# Patient Record
Sex: Female | Born: 1955 | Race: White | Hispanic: No | Marital: Married | Smoking: Never smoker
Health system: Southern US, Community
[De-identification: ages and names within clinical notes are randomized; demographics above are authoritative.]

## PROBLEM LIST (undated history)

## (undated) DIAGNOSIS — I639 Cerebral infarction, unspecified: Secondary | ICD-10-CM

## (undated) DIAGNOSIS — I10 Essential (primary) hypertension: Secondary | ICD-10-CM

## (undated) DIAGNOSIS — F32A Depression, unspecified: Secondary | ICD-10-CM

## (undated) DIAGNOSIS — K219 Gastro-esophageal reflux disease without esophagitis: Secondary | ICD-10-CM

## (undated) DIAGNOSIS — Z973 Presence of spectacles and contact lenses: Secondary | ICD-10-CM

## (undated) DIAGNOSIS — T8859XA Other complications of anesthesia, initial encounter: Secondary | ICD-10-CM

## (undated) DIAGNOSIS — F329 Major depressive disorder, single episode, unspecified: Secondary | ICD-10-CM

## (undated) DIAGNOSIS — D649 Anemia, unspecified: Secondary | ICD-10-CM

## (undated) DIAGNOSIS — M545 Low back pain, unspecified: Secondary | ICD-10-CM

## (undated) DIAGNOSIS — M797 Fibromyalgia: Secondary | ICD-10-CM

## (undated) DIAGNOSIS — I2699 Other pulmonary embolism without acute cor pulmonale: Secondary | ICD-10-CM

## (undated) DIAGNOSIS — Z9289 Personal history of other medical treatment: Secondary | ICD-10-CM

## (undated) DIAGNOSIS — F419 Anxiety disorder, unspecified: Secondary | ICD-10-CM

## (undated) DIAGNOSIS — G43909 Migraine, unspecified, not intractable, without status migrainosus: Secondary | ICD-10-CM

## (undated) DIAGNOSIS — J302 Other seasonal allergic rhinitis: Secondary | ICD-10-CM

## (undated) DIAGNOSIS — D497 Neoplasm of unspecified behavior of endocrine glands and other parts of nervous system: Secondary | ICD-10-CM

## (undated) DIAGNOSIS — R197 Diarrhea, unspecified: Secondary | ICD-10-CM

## (undated) DIAGNOSIS — M5136 Other intervertebral disc degeneration, lumbar region: Secondary | ICD-10-CM

## (undated) DIAGNOSIS — G8929 Other chronic pain: Secondary | ICD-10-CM

## (undated) DIAGNOSIS — M109 Gout, unspecified: Secondary | ICD-10-CM

## (undated) DIAGNOSIS — D469 Myelodysplastic syndrome, unspecified: Secondary | ICD-10-CM

## (undated) DIAGNOSIS — M51369 Other intervertebral disc degeneration, lumbar region without mention of lumbar back pain or lower extremity pain: Secondary | ICD-10-CM

## (undated) DIAGNOSIS — M1990 Unspecified osteoarthritis, unspecified site: Secondary | ICD-10-CM

## (undated) DIAGNOSIS — E7800 Pure hypercholesterolemia, unspecified: Secondary | ICD-10-CM

## (undated) DIAGNOSIS — K635 Polyp of colon: Secondary | ICD-10-CM

## (undated) DIAGNOSIS — Z87442 Personal history of urinary calculi: Secondary | ICD-10-CM

## (undated) DIAGNOSIS — E559 Vitamin D deficiency, unspecified: Secondary | ICD-10-CM

## (undated) DIAGNOSIS — G510 Bell's palsy: Secondary | ICD-10-CM

## (undated) HISTORY — DX: Morbid (severe) obesity due to excess calories: E66.01

## (undated) HISTORY — DX: Vitamin D deficiency, unspecified: E55.9

## (undated) HISTORY — DX: Other intervertebral disc degeneration, lumbar region: M51.36

## (undated) HISTORY — DX: Gout, unspecified: M10.9

## (undated) HISTORY — DX: Bell's palsy: G51.0

## (undated) HISTORY — PX: JOINT REPLACEMENT: SHX530

## (undated) HISTORY — PX: LAMINECTOMY AND MICRODISCECTOMY SPINE: SHX1914

## (undated) HISTORY — DX: Fibromyalgia: M79.7

## (undated) HISTORY — DX: Other intervertebral disc degeneration, lumbar region without mention of lumbar back pain or lower extremity pain: M51.369

## (undated) HISTORY — DX: Migraine, unspecified, not intractable, without status migrainosus: G43.909

## (undated) HISTORY — PX: BACK SURGERY: SHX140

## (undated) HISTORY — PX: BLADDER SURGERY: SHX569

## (undated) HISTORY — PX: ENDOMETRIAL ABLATION: SHX621

## (undated) HISTORY — DX: Other pulmonary embolism without acute cor pulmonale: I26.99

## (undated) HISTORY — PX: COLONOSCOPY W/ POLYPECTOMY: SHX1380

## (undated) HISTORY — DX: Polyp of colon: K63.5

## (undated) HISTORY — PX: DIAGNOSTIC LAPAROSCOPY: SUR761

## (undated) HISTORY — PX: COLONOSCOPY: SHX174

---

## 1988-01-04 HISTORY — PX: PAROTID GLAND TUMOR EXCISION: SHX5221

## 1997-01-03 HISTORY — PX: BREAST BIOPSY: SHX20

## 1998-03-13 ENCOUNTER — Other Ambulatory Visit: Admission: RE | Admit: 1998-03-13 | Discharge: 1998-03-13 | Payer: Self-pay | Admitting: Gynecology

## 1999-01-22 ENCOUNTER — Ambulatory Visit (HOSPITAL_COMMUNITY): Admission: RE | Admit: 1999-01-22 | Discharge: 1999-01-22 | Payer: Self-pay | Admitting: Urology

## 1999-01-22 ENCOUNTER — Encounter: Payer: Self-pay | Admitting: Urology

## 1999-01-27 ENCOUNTER — Encounter: Payer: Self-pay | Admitting: Urology

## 1999-01-27 ENCOUNTER — Encounter: Admission: RE | Admit: 1999-01-27 | Discharge: 1999-01-27 | Payer: Self-pay | Admitting: Urology

## 1999-02-16 ENCOUNTER — Encounter: Payer: Self-pay | Admitting: Urology

## 1999-02-16 ENCOUNTER — Encounter: Admission: RE | Admit: 1999-02-16 | Discharge: 1999-02-16 | Payer: Self-pay | Admitting: Urology

## 1999-05-11 ENCOUNTER — Other Ambulatory Visit: Admission: RE | Admit: 1999-05-11 | Discharge: 1999-05-11 | Payer: Self-pay | Admitting: Gynecology

## 1999-05-20 ENCOUNTER — Other Ambulatory Visit: Admission: RE | Admit: 1999-05-20 | Discharge: 1999-05-20 | Payer: Self-pay | Admitting: Gynecology

## 1999-05-20 ENCOUNTER — Encounter (INDEPENDENT_AMBULATORY_CARE_PROVIDER_SITE_OTHER): Payer: Self-pay

## 1999-09-28 ENCOUNTER — Encounter: Payer: Self-pay | Admitting: Urology

## 1999-09-28 ENCOUNTER — Encounter: Admission: RE | Admit: 1999-09-28 | Discharge: 1999-09-28 | Payer: Self-pay | Admitting: Urology

## 1999-10-05 ENCOUNTER — Encounter: Admission: RE | Admit: 1999-10-05 | Discharge: 1999-10-05 | Payer: Self-pay | Admitting: Urology

## 1999-10-05 ENCOUNTER — Encounter: Payer: Self-pay | Admitting: Urology

## 1999-10-05 ENCOUNTER — Encounter (INDEPENDENT_AMBULATORY_CARE_PROVIDER_SITE_OTHER): Payer: Self-pay | Admitting: *Deleted

## 2000-07-12 ENCOUNTER — Other Ambulatory Visit: Admission: RE | Admit: 2000-07-12 | Discharge: 2000-07-12 | Payer: Self-pay | Admitting: Gynecology

## 2001-07-12 ENCOUNTER — Other Ambulatory Visit: Admission: RE | Admit: 2001-07-12 | Discharge: 2001-07-12 | Payer: Self-pay | Admitting: Gynecology

## 2002-07-23 ENCOUNTER — Other Ambulatory Visit: Admission: RE | Admit: 2002-07-23 | Discharge: 2002-07-23 | Payer: Self-pay | Admitting: Gynecology

## 2003-08-05 ENCOUNTER — Other Ambulatory Visit: Admission: RE | Admit: 2003-08-05 | Discharge: 2003-08-05 | Payer: Self-pay | Admitting: Gynecology

## 2004-06-03 HISTORY — PX: TUBAL LIGATION: SHX77

## 2004-07-02 ENCOUNTER — Ambulatory Visit (HOSPITAL_COMMUNITY): Admission: RE | Admit: 2004-07-02 | Discharge: 2004-07-02 | Payer: Self-pay | Admitting: Gynecology

## 2004-07-02 ENCOUNTER — Ambulatory Visit (HOSPITAL_BASED_OUTPATIENT_CLINIC_OR_DEPARTMENT_OTHER): Admission: RE | Admit: 2004-07-02 | Discharge: 2004-07-02 | Payer: Self-pay | Admitting: Gynecology

## 2004-07-02 ENCOUNTER — Encounter (INDEPENDENT_AMBULATORY_CARE_PROVIDER_SITE_OTHER): Payer: Self-pay | Admitting: Specialist

## 2004-11-23 ENCOUNTER — Other Ambulatory Visit: Admission: RE | Admit: 2004-11-23 | Discharge: 2004-11-23 | Payer: Self-pay | Admitting: Gynecology

## 2005-09-08 ENCOUNTER — Other Ambulatory Visit: Admission: RE | Admit: 2005-09-08 | Discharge: 2005-09-08 | Payer: Self-pay | Admitting: Gynecology

## 2005-10-19 ENCOUNTER — Emergency Department (HOSPITAL_COMMUNITY): Admission: EM | Admit: 2005-10-19 | Discharge: 2005-10-19 | Payer: Self-pay | Admitting: Emergency Medicine

## 2007-06-01 ENCOUNTER — Ambulatory Visit: Payer: Self-pay | Admitting: Internal Medicine

## 2007-06-15 ENCOUNTER — Ambulatory Visit: Payer: Self-pay | Admitting: Internal Medicine

## 2007-06-15 ENCOUNTER — Encounter: Payer: Self-pay | Admitting: Internal Medicine

## 2007-06-18 ENCOUNTER — Encounter: Payer: Self-pay | Admitting: Internal Medicine

## 2008-01-04 DIAGNOSIS — D497 Neoplasm of unspecified behavior of endocrine glands and other parts of nervous system: Secondary | ICD-10-CM

## 2008-01-04 HISTORY — PX: BIOPSY THYROID: PRO38

## 2008-01-04 HISTORY — DX: Neoplasm of unspecified behavior of endocrine glands and other parts of nervous system: D49.7

## 2008-01-18 ENCOUNTER — Encounter: Admission: RE | Admit: 2008-01-18 | Discharge: 2008-01-18 | Payer: Self-pay | Admitting: Rheumatology

## 2008-01-23 ENCOUNTER — Ambulatory Visit: Payer: Self-pay | Admitting: Cardiology

## 2008-01-23 ENCOUNTER — Encounter (INDEPENDENT_AMBULATORY_CARE_PROVIDER_SITE_OTHER): Payer: Self-pay | Admitting: *Deleted

## 2008-01-23 ENCOUNTER — Ambulatory Visit (HOSPITAL_COMMUNITY): Admission: RE | Admit: 2008-01-23 | Discharge: 2008-01-23 | Payer: Self-pay | Admitting: Rheumatology

## 2008-01-23 ENCOUNTER — Inpatient Hospital Stay (HOSPITAL_COMMUNITY): Admission: EM | Admit: 2008-01-23 | Discharge: 2008-01-27 | Payer: Self-pay | Admitting: Emergency Medicine

## 2008-01-23 ENCOUNTER — Ambulatory Visit (HOSPITAL_COMMUNITY): Admission: RE | Admit: 2008-01-23 | Discharge: 2008-01-23 | Payer: Self-pay | Admitting: Interventional Radiology

## 2008-01-25 ENCOUNTER — Encounter (INDEPENDENT_AMBULATORY_CARE_PROVIDER_SITE_OTHER): Payer: Self-pay | Admitting: Internal Medicine

## 2008-02-08 ENCOUNTER — Ambulatory Visit (HOSPITAL_COMMUNITY): Admission: RE | Admit: 2008-02-08 | Discharge: 2008-02-08 | Payer: Self-pay | Admitting: Rheumatology

## 2008-02-22 ENCOUNTER — Inpatient Hospital Stay (HOSPITAL_COMMUNITY): Admission: EM | Admit: 2008-02-22 | Discharge: 2008-02-23 | Payer: Self-pay | Admitting: Emergency Medicine

## 2008-02-28 ENCOUNTER — Ambulatory Visit: Payer: Self-pay | Admitting: Cardiology

## 2008-03-05 ENCOUNTER — Inpatient Hospital Stay (HOSPITAL_COMMUNITY): Admission: EM | Admit: 2008-03-05 | Discharge: 2008-03-06 | Payer: Self-pay | Admitting: Emergency Medicine

## 2008-03-06 ENCOUNTER — Ambulatory Visit: Payer: Self-pay

## 2008-08-28 ENCOUNTER — Ambulatory Visit: Payer: Self-pay | Admitting: Gynecology

## 2008-08-28 ENCOUNTER — Encounter: Payer: Self-pay | Admitting: Gynecology

## 2008-08-28 ENCOUNTER — Other Ambulatory Visit: Admission: RE | Admit: 2008-08-28 | Discharge: 2008-08-28 | Payer: Self-pay | Admitting: Gynecology

## 2008-09-02 ENCOUNTER — Ambulatory Visit: Payer: Self-pay | Admitting: Gynecology

## 2008-09-03 ENCOUNTER — Ambulatory Visit: Payer: Self-pay | Admitting: Gynecology

## 2008-09-09 ENCOUNTER — Ambulatory Visit: Payer: Self-pay | Admitting: Gynecology

## 2008-10-27 ENCOUNTER — Telehealth: Payer: Self-pay | Admitting: Internal Medicine

## 2008-10-29 ENCOUNTER — Ambulatory Visit: Payer: Self-pay | Admitting: Internal Medicine

## 2008-10-29 DIAGNOSIS — R079 Chest pain, unspecified: Secondary | ICD-10-CM | POA: Insufficient documentation

## 2008-10-29 DIAGNOSIS — Z860100 Personal history of colon polyps, unspecified: Secondary | ICD-10-CM | POA: Insufficient documentation

## 2008-10-29 DIAGNOSIS — K219 Gastro-esophageal reflux disease without esophagitis: Secondary | ICD-10-CM | POA: Insufficient documentation

## 2008-10-31 ENCOUNTER — Encounter: Payer: Self-pay | Admitting: Cardiology

## 2008-11-12 ENCOUNTER — Ambulatory Visit (HOSPITAL_COMMUNITY): Admission: RE | Admit: 2008-11-12 | Discharge: 2008-11-12 | Payer: Self-pay | Admitting: Neurosurgery

## 2008-11-18 ENCOUNTER — Encounter: Payer: Self-pay | Admitting: Cardiology

## 2008-11-18 ENCOUNTER — Telehealth (INDEPENDENT_AMBULATORY_CARE_PROVIDER_SITE_OTHER): Payer: Self-pay | Admitting: *Deleted

## 2008-11-19 ENCOUNTER — Ambulatory Visit: Payer: Self-pay | Admitting: Cardiovascular Disease

## 2008-11-19 ENCOUNTER — Encounter: Payer: Self-pay | Admitting: Cardiology

## 2008-11-28 ENCOUNTER — Encounter: Payer: Self-pay | Admitting: Cardiovascular Disease

## 2008-11-28 ENCOUNTER — Ambulatory Visit: Payer: Self-pay

## 2008-11-28 ENCOUNTER — Ambulatory Visit: Payer: Self-pay | Admitting: Internal Medicine

## 2008-11-28 ENCOUNTER — Ambulatory Visit (HOSPITAL_COMMUNITY): Admission: RE | Admit: 2008-11-28 | Discharge: 2008-11-28 | Payer: Self-pay | Admitting: Cardiovascular Disease

## 2008-12-11 ENCOUNTER — Ambulatory Visit (HOSPITAL_COMMUNITY): Admission: RE | Admit: 2008-12-11 | Discharge: 2008-12-12 | Payer: Self-pay | Admitting: Neurosurgery

## 2009-01-05 ENCOUNTER — Encounter: Payer: Self-pay | Admitting: Internal Medicine

## 2009-01-17 ENCOUNTER — Ambulatory Visit (HOSPITAL_COMMUNITY): Admission: RE | Admit: 2009-01-17 | Discharge: 2009-01-17 | Payer: Self-pay | Admitting: Neurosurgery

## 2009-01-29 ENCOUNTER — Ambulatory Visit (HOSPITAL_COMMUNITY): Admission: RE | Admit: 2009-01-29 | Discharge: 2009-01-30 | Payer: Self-pay | Admitting: Neurosurgery

## 2009-03-18 ENCOUNTER — Ambulatory Visit (HOSPITAL_COMMUNITY): Admission: RE | Admit: 2009-03-18 | Discharge: 2009-03-18 | Payer: Self-pay

## 2009-03-18 ENCOUNTER — Ambulatory Visit: Payer: Self-pay | Admitting: Gynecology

## 2009-10-30 ENCOUNTER — Ambulatory Visit (HOSPITAL_COMMUNITY)
Admission: RE | Admit: 2009-10-30 | Discharge: 2009-10-30 | Payer: Self-pay | Source: Home / Self Care | Admitting: Internal Medicine

## 2009-11-05 ENCOUNTER — Ambulatory Visit: Payer: Self-pay | Admitting: Vascular Surgery

## 2009-11-05 ENCOUNTER — Ambulatory Visit
Admission: RE | Admit: 2009-11-05 | Discharge: 2009-11-05 | Payer: Self-pay | Source: Home / Self Care | Admitting: Internal Medicine

## 2010-01-19 IMAGING — CT CT ABDOMEN W/ CM
2 of 7 series · 13 of 46 positions shown, 18 images · IV contrast (APPLIED)
Comparison: None

CT CHEST

CLINICAL DATA: Abnormal chest x-ray.  Elevated sed rate.  Evaluate
for malignancy.

CT CHEST, ABDOMEN AND PELVIS WITH CONTRAST
TECHNIQUE: Multidetector CT imaging of the chest, abdomen and
pelvis was performed following the standard protocol during bolus
administration of intravenous contrast.
Contrast: 100 ml Ymnipaque-ULL

[Series 7: abd/pel 5.0 b31f · axial · 0.98mm/px · z∈[-655,-250]mm · 10 of 95 slices shown, 15 images]
[im 7/95  soft-tissue]
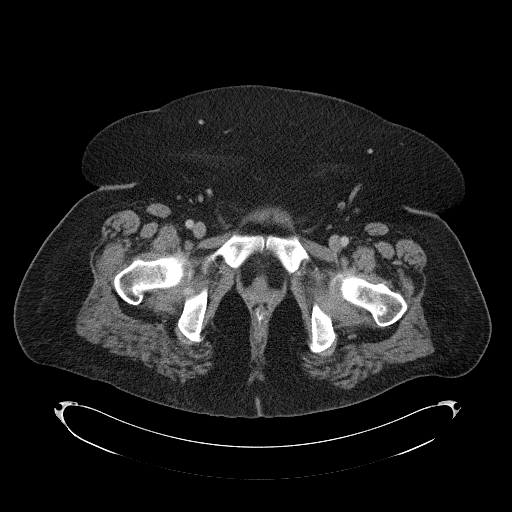
[im 7/95  bone]
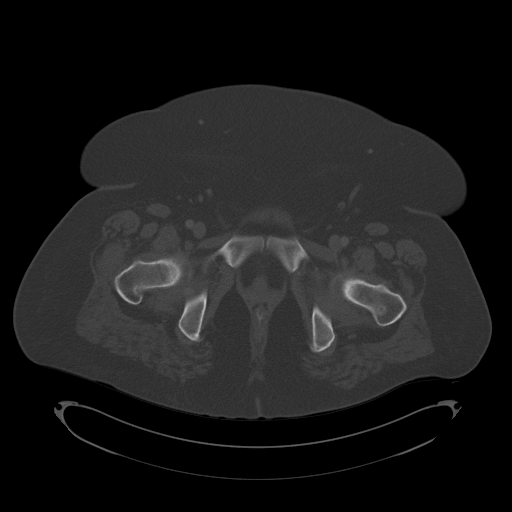
[im 19/95  soft-tissue]
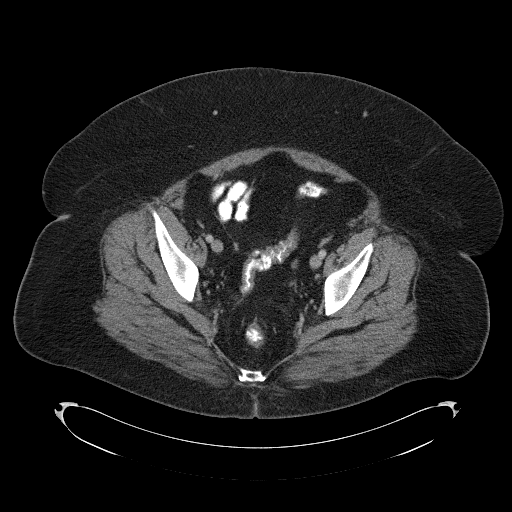
[im 26/95  soft-tissue]
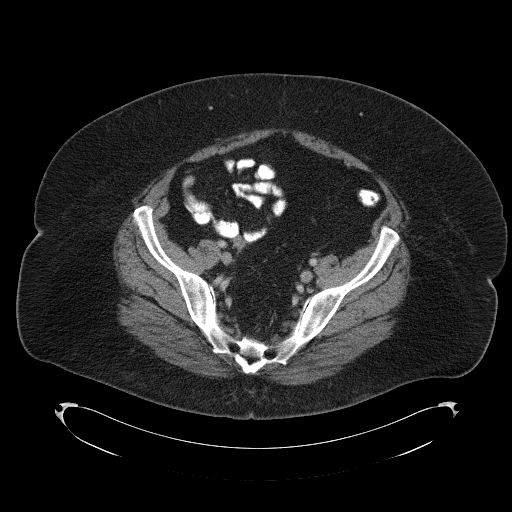
[im 38/95  soft-tissue]
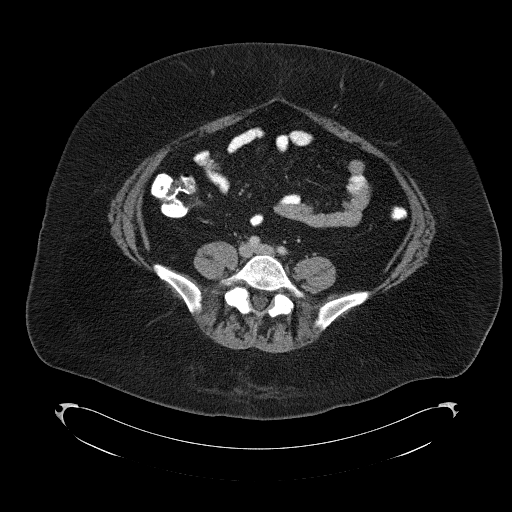
[im 51/95  soft-tissue]
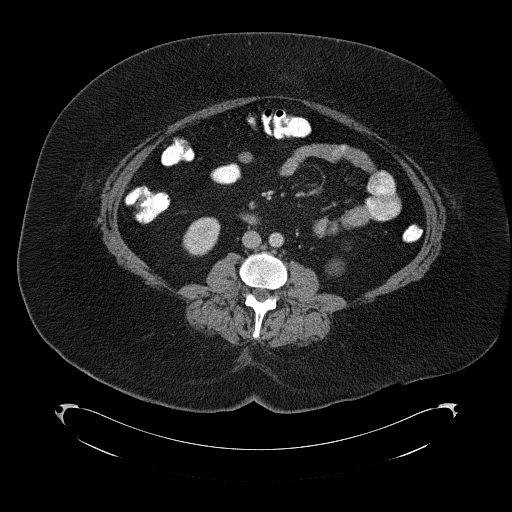
[im 57/95  soft-tissue]
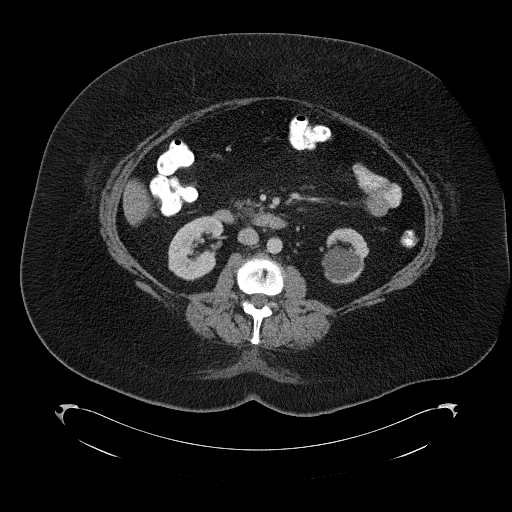
[im 69/95  soft-tissue]
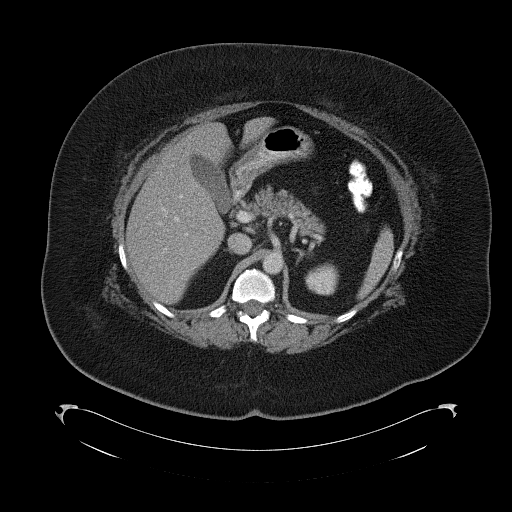
[im 69/95  lung]
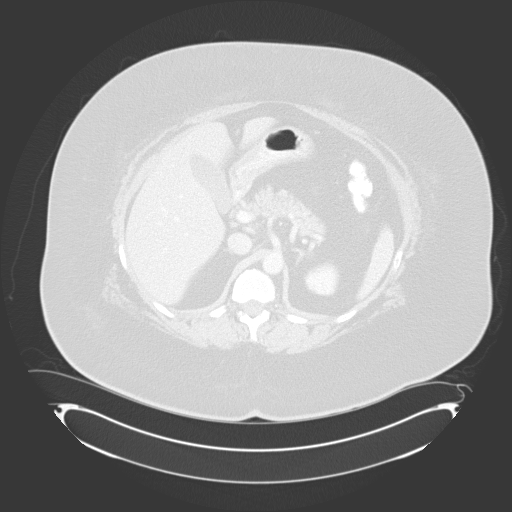
[im 76/95  soft-tissue]
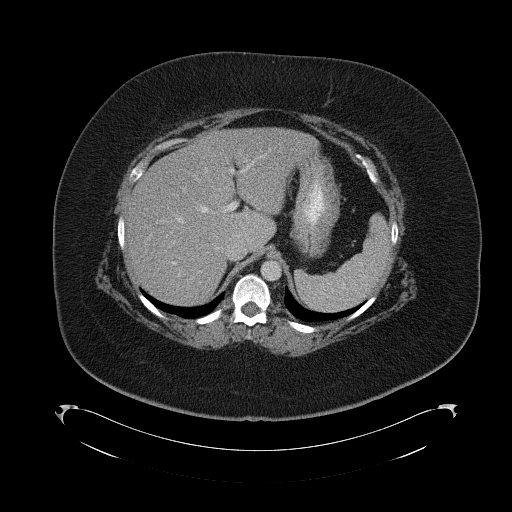
[im 76/95  lung]
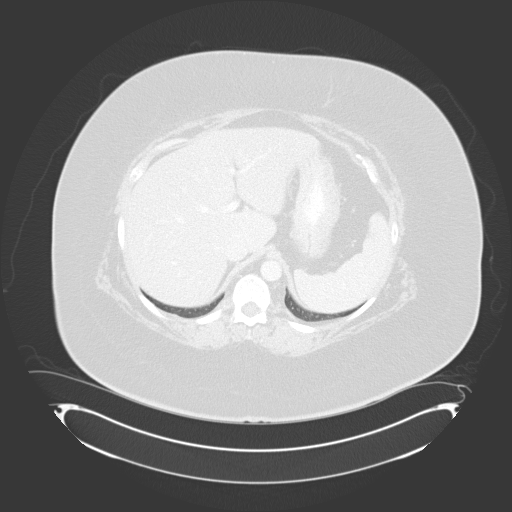
[im 82/95  lung]
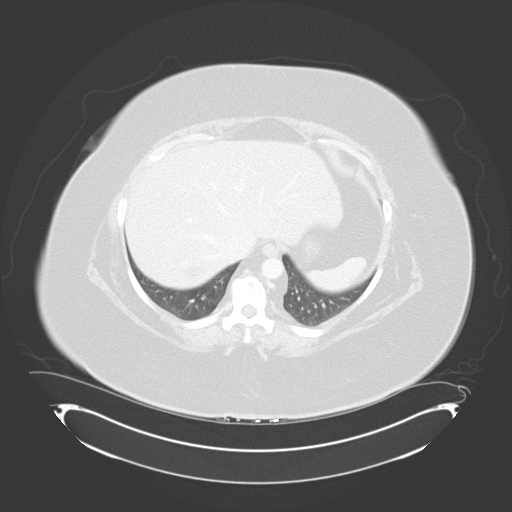
[im 88/95  soft-tissue]
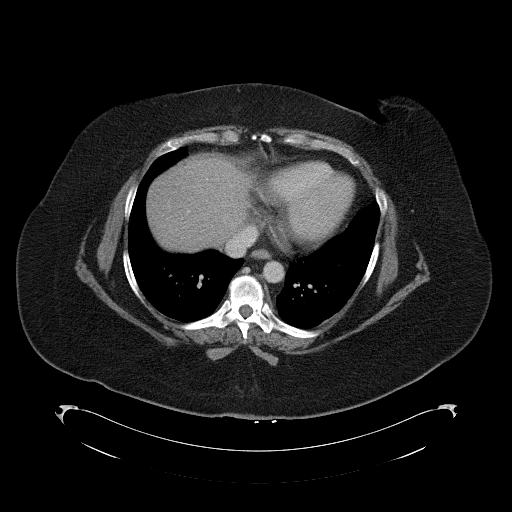
[im 88/95  lung]
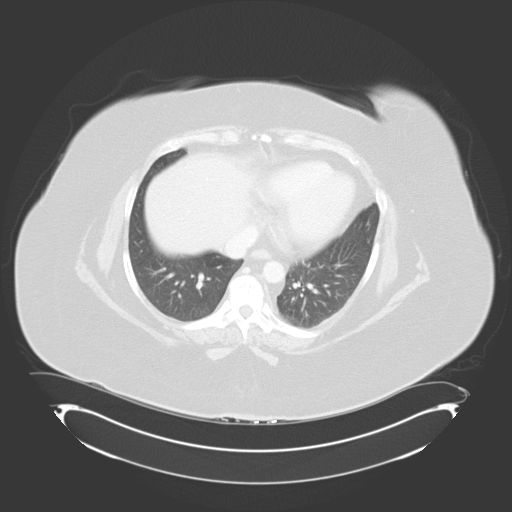
[im 88/95  bone]
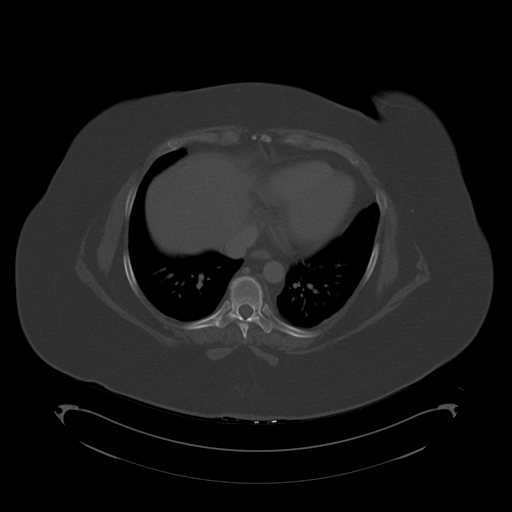

[Series 9: abd/pel 2.0 spo cor cor · coronal · 0.93mm/px · 3 of 110 slices shown]
[im 28/110  soft-tissue]
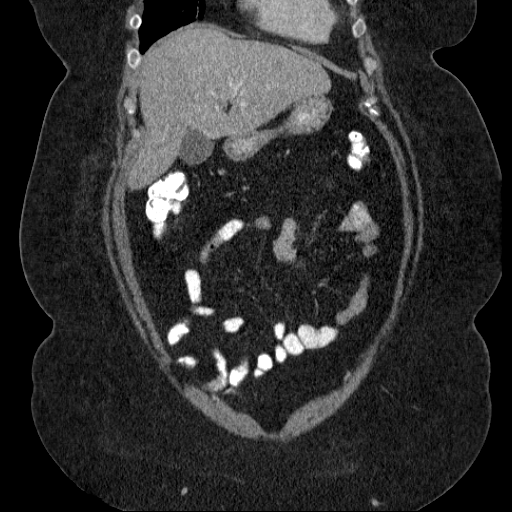
[im 55/110  soft-tissue]
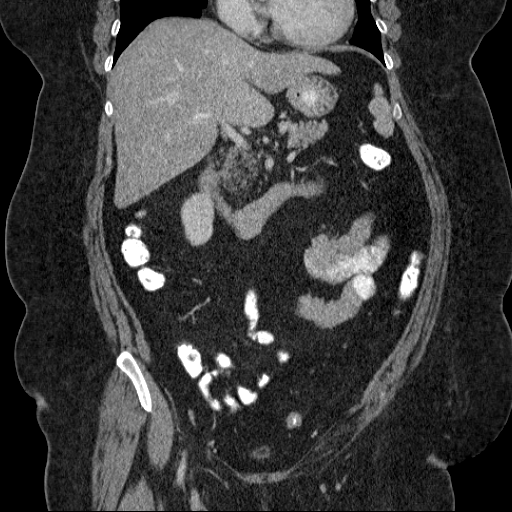
[im 82/110  soft-tissue]
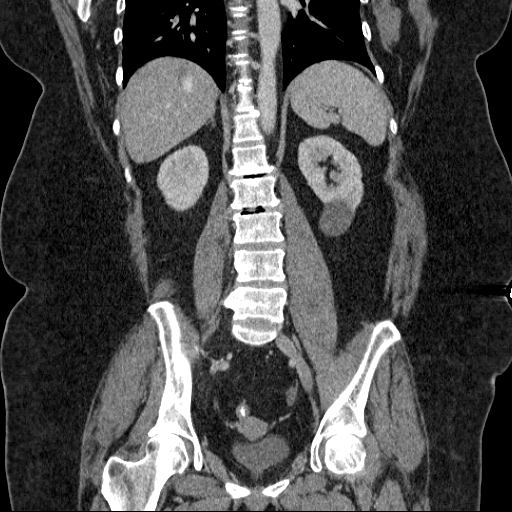

[13 of 46 positions shown; findings below may reference images not displayed]

FINDINGS: The chest wall is unremarkable.  No obvious breast
masses, supraclavicular or axillary adenopathy.

The heart is upper limits of normal.  No pericardial effusion.  No
mediastinal or hilar adenopathy.  The aorta is normal in caliber.
No dissection.  The esophagus is unremarkable.

Although this is not a dedicated CT angiogram there is evidence of
bilateral pulmonary emboli.

Examination of the lung parenchyma demonstrates minimal streaky
right middle lobe atelectasis or scarring.  No worrisome pulmonary
masses or nodules.  No infiltrates, edema or effusions.  The
tracheobronchial tree is grossly normal.
IMPRESSION: 1.  Bilateral pulmonary emboli.
2.  Borderline heart size.
3.  Normal thoracic aorta.
4.  Minimal streaky right middle lobe atelectasis or scarring.  No
infiltrates, effusions or pulmonary masses.

CT ABDOMEN
FINDINGS: There is diffuse fatty infiltration of the liver.  There
is a right hepatic lobe liver lesion which has imaging
characteristics of a benign hepatic hemangioma.  No further imaging
evaluation is necessary.  No biliary dilatation.  The gallbladder
is normal.  The spleen is normal in size.  The pancreas is
unremarkable.  The adrenal glands and kidneys are normal.  There is
a benign appearing left renal cyst.

The stomach, duodenum, small bowel and colon are unremarkable.  No
mesenteric or retroperitoneal masses or adenopathy.  The

The aorta is normal in caliber.  No dissection.  The major branch
vessels are normal.  The portal and splenic veins are patent.  No
significant bony findings.  Degenerative disc disease noted at L1-2
and L2-3.
IMPRESSION: 1.  Mild diffuse fatty infiltration of the liver.
2.  Benign hepatic hemangioma.
3.  No acute abdominal findings, mass lesions or adenopathy.
4.  Benign left renal cyst.

CT PELVIS
FINDINGS: The rectum and sigmoid colon are unremarkable except for
mild diverticulosis and muscular wall thickening.  The uterus and
ovaries are normal.  The appendix is normal.  The bladder is
normal.  No pelvic masses, adenopathy or free pelvic fluid
collections.

The bony pelvis is intact.  No inguinal mass or hernia.
IMPRESSION: 1.  No acute pelvic findings, mass lesions or adenopathy.
2.  Mild diverticulosis of the sigmoid colon with associated
muscular thickening.

Critical test results telephoned to Dr.Ife at the time of
interpretation on 01/23/2008 at 5422 hours.

## 2010-01-24 ENCOUNTER — Encounter: Payer: Self-pay | Admitting: Rheumatology

## 2010-02-04 NOTE — Letter (Signed)
Summary: Patient Notice- Polyp Results  [facility]  [address] [place], [place] [address]   Phone: [phone]  Fax: [phone]        June 18, 2007 MRN: [id number]    [facility] [address] [place], [place]  [address]    Dear Ms. Sprinkle,  I am pleased to inform you that the colon polyp(s) removed during your recent colonoscopy was (were) found to be benign (no cancer detected) upon pathologic examination.  I recommend you have a repeat colonoscopy examination in 5 years to look for recurrent polyps, as having colon polyps increases your risk for having recurrent polyps or even colon cancer in the future.  Should you develop new or worsening symptoms of abdominal pain, bowel habit changes or bleeding from the rectum or bowels, please schedule an evaluation with either your primary care physician or with me.  Additional information/recommendations:  _X._ No further action with gastroenterology is needed at this time. Please      follow-up with your primary care physician for your other healthcare      needs.   Please call us if you are having persistent problems or have questions about your condition that have not been fully answered at this time.  Sincerely,  Hilarie Fredrickson MD  This letter has been electronically signed by your physician.

## 2010-02-04 NOTE — Letter (Signed)
Summary: [facility]  [facility]   Imported By: Kassie Mends 12/15/2008 07:46:59  _____________________________________________________________________  External Attachment:    Type:   Image     Comment:   External Document

## 2010-02-04 NOTE — Progress Notes (Signed)
Summary: CHEST PAIN DUE TO REFLUX  Phone Note Call from Patient Call back at Home Phone [phone]   Call For: Dr Marina Goodell  Reason for Call: Talk to Nurse Summary of Call: Has been to Emergency Room several times for chest pain.  Was told its her  Reflux and can be due to the ammont of Nexium she has used. They adviced her she needs to have an Endo as soon as she can.  Initial call taken by: Leanor Kail [facility],  October 27, 2008 1:35 PM  Follow-up for Phone Call        Has been on Nexium from pcp.When Dr.Lyzette Reinhardt did her colon a year and a half ago she said she discussed reflux and need for possible endo with him.GIven office visit for this Wed. 10/29/08. Follow-up by: Teryl Lucy RN,  October 27, 2008 2:49 PM

## 2010-02-04 NOTE — Letter (Signed)
Summary: [facility] Office Note  [facility] Office Note   Imported By: Roderic Ovens 12/18/2008 09:52:26  _____________________________________________________________________  External Attachment:    Type:   Image     Comment:   External Document

## 2010-02-04 NOTE — Assessment & Plan Note (Signed)
Summary: CHEST PAIN / GERD   History of Present Illness Visit Type: new patient  Primary GI MD: Yancey Flemings MD Primary Provider: Simone Curia, MD  Requesting Provider: n/a Chief Complaint: chest pain History of Present Illness:   55 year old female with morbid obesity, fibromyalgia, GERD, interstitial cystitis, or we'll bowel syndrome, adenomatous colon polyps, anxiety/depression, and pulmonary embolus. She sent today at the request of the [facility] emergency room regarding recurrent chest pain. The patient was seen in the esophagus on one occasion in June of 2009 as a referral for direct colonoscopy. She was found to have a 7 mm cecal polyp which was removed and found to be a tubular adenoma. Followup in 5 years recommended. Her current history is that of recurrent chest pain since January of 2010. In January of 2010 she was diagnosed with pulmonary embolus. She was placed on Coumadin. Since that time she has had multiple hospitalizations and emergency room visits for recurrent chest pain. She has undergone repeated evaluations and her pain has not been felt to be due to recurrent pulmonary emboli or cardiac cause (she has had formal cardiology evaluation). It was suggested by the emergency room that this may be due to reflux. She is referred. Patient has been on Nexium for some time to manage classic heartburn and indigestion. On the medication she is asymptomatic in this regard. Off medication, significant symptoms. In terms of chest pain, she describes this as being daily. It lasts anywhere from a few minutes to an entire half day. There is no particular time of day that is most problematic. She cannot site any factors that bring on the pain such as eating, movement, stress, or activity. She feels that lying down does help. The pain is exacerbated by direct pressure a palpation. She denies dysphagia. She has taken disability and is depressed about her inactivity. She is anticipating back surgery.  She has had anorexia and an associated 25 pound weight loss reported. Some nausea but no vomiting. No bleeding.   GI Review of Systems    Reports acid reflux, chest pain, nausea, and  weight loss.   Weight loss of 25 pounds over 10 months.   Denies abdominal pain, belching, bloating, dysphagia with liquids, dysphagia with solids, heartburn, loss of appetite, vomiting, vomiting blood, and  weight gain.      Reports irritable bowel syndrome.     Denies anal fissure, black tarry stools, change in bowel habit, constipation, diarrhea, diverticulosis, fecal incontinence, heme positive stool, hemorrhoids, jaundice, light color stool, liver problems, rectal bleeding, and  rectal pain.    Current Medications (verified): 1)  Atenolol 100 Mg Tabs (Atenolol) .... One Tablet By Mouth Two Times A Day 2)  Nexium 40 Mg Pack (Esomeprazole Magnesium) .... One Tablet By Mouth Once Daily 3)  Celexa 10 Mg Tabs (Citalopram Hydrobromide) .... One Tablet By Mouth Once Daily 4)  Vitamin D (Ergocalciferol) 50000 Unit Caps (Ergocalciferol) .... One Capsule By Mouth Once A Week 5)  Aspirin 81 Mg  Tabs (Aspirin) .... Two Tablets By Mouth Daily 6)  Cymbalta 60 Mg Cpep (Duloxetine Hcl) .... One Tablet By Mouth Two Times A Day 7)  Darvocet-N 100 100-650 Mg Tabs (Propoxyphene N-Apap) .... One Tablet By Mouth Three Times A Day 8)  Calcium 600 1500 Mg Tabs (Calcium Carbonate) .... One Tablet By Mouth Once Daily  Allergies (verified): No Known Drug Allergies  Past History:  Past Medical History: Anxiety Disorder Arthritis Adenomatous Colon Polyps Depression DVT Fibromyalgia GERD Interstitial Cystitis  Irritable Bowel Syndrome Kidney Stones Obesity Urinary Tract Infection  Past Surgical History: Tubal Ligation Tumor removed on left side of neck   Family History: No FH of Colon Cancer:  Social History: Occupation: Disabled from [employer] Married No childern Patient has never smoked.  Alcohol Use  - yes: One drink a month  Daily Caffeine Use: 3 daily Illicit Drug Use - no Patient does not get regular exercise.  Smoking Status:  never Drug Use:  no Does Patient Exercise:  no  Review of Systems       sinus and allergy trouble, arthritis, anxiety, back pain, depression, fatigue, headaches, muscle pains, night sweats, shortness of breath, urinary leakage. All other systems reviewed and reported as negative.  Vital Signs:  Patient profile:   55 year old female Height:      64 inches Weight:      253 pounds BMI:     43.58 BSA:     2.16 Pulse rate:   72 / minute Pulse rhythm:   regular BP sitting:   128 / 84  (right arm) Cuff size:   regular  Vitals Entered By: Ok Anis CMA (October 29, 2008 9:04 AM)  Physical Exam  General:  Depressed appearing but otherwise Well developed, well nourished, no acute distress. Head:  Normocephalic and atraumatic. Eyes:  PERRLA, no icterus. Ears:  Normal auditory acuity. Nose:  No deformity, discharge,  or lesions. Mouth:  left facial droop Neck:  Supple; no masses or thyromegaly. Chest Wall:  her chest pain is reproduced with palpation over the midsternum Lungs:  Clear throughout to auscultation. Heart:  Regular rate and rhythm; no murmurs, rubs,  or bruits. Abdomen:  Obese, Soft, nontender and nondistended. No masses, hepatosplenomegaly or hernias noted. Normal bowel sounds. Msk:  Symmetrical with no gross deformities. Normal posture. Pulses:  Normal pulses noted. Extremities:  No clubbing, cyanosis, edema or deformities noted. Neurologic:  Alert and  oriented x4;  grossly normal neurologically. Skin:  Intact without significant lesions or rashes. Psych:  Alert and cooperative. Normal mood and affect.   Impression & Recommendations:  Problem # 1:  CHEST PAIN (ICD-786.50) The patient presents with chronic atypical chest pain. She does have a history of pulmonary embolus and has been treated with anticoagulation therapy. She has had  negative cardiac workup. Her history and physical exam were most consistent with a musculoskeletal etiology. Her pain is reproduced with direct palpation. This may be an extension of her fibromyalgia. I do not think that this is reflux disease (though she does have GERD which is well controlled with PPI therapy). I see no value in diagnostic endoscopy is this patient has no alarm features. I have reassured her this regard. She will return to the care of her primary provider. We will forward our impression.  Problem # 2:  GERD (ICD-530.81) classic GERD controlled with Nexium. No alarm features.  Plan: #1. Reflux precautions with attention to weight loss. Ongoing. #2. Continue Nexium #3. Should the patient develop refractory symptoms or dysphagia she has been instructed to contact this office. She understands.  Problem # 3:  PERSONAL HX COLONIC POLYPS (ICD-V12.72) personal history of adenomatous colon polyps June 2009. Routine surveillance colonoscopy plan for June 2014. Sooner if clinically indicated.  Patient Instructions: 1)  Please schedule a follow-up appointment as needed.  2)  The medication list was reviewed and reconciled.  All changed / newly prescribed medications were explained.  A complete medication list was provided to the patient /  caregiver. 3)  Copy: Dr. Simone Curia

## 2010-02-04 NOTE — Procedures (Signed)
Summary: Colonoscopy   Colonoscopy  Procedure date:  06/15/2007  Findings:      Location:  [facility].    Procedures Next Due Date:    Colonoscopy: 06/2012  Patient Name: Diana, Trujillo MRN:  Procedure Procedures: Colonoscopy CPT: 54098.    with polypectomy. CPT: A3573898.  Personnel: Endoscopist: Wilhemina Bonito. Marina Goodell, MD.  Exam Location: Exam performed in Outpatient Clinic. Outpatient  Patient Consent: Procedure, Alternatives, Risks and Benefits discussed, consent obtained, from patient. Consent was obtained by the RN.  Indications  Average Risk Screening Routine.  History  Current Medications: Patient is not currently taking Coumadin.  Pre-Exam Physical: Performed Jun 15, 2007. Cardio-pulmonary exam, Rectal exam, HEENT exam , Abdominal exam, Mental status exam WNL.  Comments: Pt. history reviewed/updated, physical exam performed prior to initiation of sedation?YES Exam Exam: Extent of exam reached: Terminal Ileum, extent intended: Terminal Ileum.  The cecum was identified by appendiceal orifice and IC valve. Patient position: on left side. Time to Cecum: 00:01:51. Time for Withdrawl: 00:07:38. Colon retroflexion performed. Images taken. ASA Classification: II. Tolerance: excellent.  Monitoring: Pulse and BP monitoring, Oximetry used. Supplemental O2 given.  Colon Prep Used Miralax for colon prep. Prep results: excellent.  Sedation Meds: Patient assessed and found to be appropriate for moderate (conscious) sedation. Fentanyl 100 mcg. given IV. Versed 10 mg. given IV.  Findings NORMAL EXAM: Cecum to Rectum.  NORMAL EXAM: Ileum.  POLYP: Cecum, Maximum size: 7 mm. sessile polyp. Procedure:  snare without cautery, removed, retrieved, Polyp sent to pathology. ICD9: Colon Polyps: 211.3.   Assessment  Diagnoses: 211.3: Colon Polyps.   Events  Unplanned Interventions: No intervention was required.  Unplanned Events: There were no  complications. Plans Disposition: After procedure patient sent to recovery. After recovery patient sent home.  Scheduling/Referral: Colonoscopy, to Wilhemina Bonito. Marina Goodell, MD, in 5 years if polyp adenomatous; otherwise 10 yrs,      cc.   Keung Lee,MD       REPORT OF SURGICAL PATHOLOGY   Case #: [id number] Patient Name: [name], [name]. Office Chart Number:  [id number]   MRN: [id number] Pathologist: Beulah Gandy. Luisa Hart, MD DOB/Age  22-Jan-1955 (Age: 55)    Gender: F Date Taken:  06/15/2007 Date Received: 06/15/2007   FINAL DIAGNOSIS   ***MICROSCOPIC EXAMINATION AND DIAGNOSIS***   COLON, CECAL POLYP:  TUBULAR ADENOMA AND A FRAGMENT OF BENIGN COLONIC MUCOSA.  NO HIGH GRADE DYSPLASIA OR MALIGNANCY IDENTIFIED.   mw Date Reported:  06/18/2007     Beulah Gandy. Luisa Hart, MD *** Electronically Signed Out By JDP ***   Clinical information Screening.  R/O adenoma (mw)   specimen(s) obtained Colon, polyp(s), cecal   Gross Description Received in formalin is a tan, soft tissue fragment that is submitted in toto.  Size:  0.6 cm (GP:mj 06/15/07)   mj/     Signed by Hilarie Fredrickson MD on 06/18/2007 at 5:10 PM  ________________________________________________________________________ recall colonoscopy in 5 years   Signed by Hilarie Fredrickson MD on 06/18/2007 at 5:10 PM    June 18, 2007 MRN: [id number]    [facility] [address] [place], [place]  [address]    Dear Ms. Crisco,  I am pleased to inform you that the colon polyp(s) removed during your recent colonoscopy was (were) found to be benign (no cancer detected) upon pathologic examination.  I recommend you have a repeat colonoscopy examination in 5 years to look for recurrent polyps, as having colon polyps increases your risk for having recurrent polyps  or even colon cancer in the future.  Should you develop new or worsening symptoms of abdominal pain, bowel habit changes or bleeding from the rectum or bowels, please schedule an  evaluation with either your primary care physician or with me.  Additional information/recommendations:  _X._ No further action with gastroenterology is needed at this time. Please      follow-up with your primary care physician for your other healthcare      needs.   Please call us if you are having persistent problems or have questions about your condition that have not been fully answered at this time.  Sincerely,  Hilarie Fredrickson MD  This letter has been electronically signed by your physician. This report was created from the original endoscopy report, which was reviewed and signed by the above listed endoscopist.

## 2010-02-04 NOTE — Miscellaneous (Signed)
  Clinical Lists Changes  Medications: Added new medication of MIRALAX   POWD (POLYETHYLENE GLYCOL 3350) As per prep  instructions. - Signed Added new medication of METOCLOPRAMIDE HCL 10 MG  TABS (METOCLOPRAMIDE HCL) As per prep instructions. - Signed Rx of MIRALAX   POWD (POLYETHYLENE GLYCOL 3350) As per prep  instructions.;  #255gm x 0;  Signed;  Entered by: Wyona Almas RN;  Authorized by: Hilarie Fredrickson MD;  Method used: Electronic Rx of METOCLOPRAMIDE HCL 10 MG  TABS (METOCLOPRAMIDE HCL) As per prep instructions.;  #2 x 0;  Signed;  Entered by: Wyona Almas RN;  Authorized by: Hilarie Fredrickson MD;  Method used: Electronic Observations: Added new observation of NKA: T (06/01/2007 12:45)    Prescriptions: METOCLOPRAMIDE HCL 10 MG  TABS (METOCLOPRAMIDE HCL) As per prep instructions.  #2 x 0   Entered by:   Wyona Almas RN   Authorized by:   Hilarie Fredrickson MD   Signed by:   Wyona Almas RN on 06/01/2007   Method used:   Electronically sent to ...       [facility] [phone]*       [address]       [place], [place]  [address]       Ph: [phone] or [phone]       Fax: [phone]   RxID:   [id number] MIRALAX   POWD (POLYETHYLENE GLYCOL 3350) As per prep  instructions.  #255gm x 0   Entered by:   Wyona Almas RN   Authorized by:   Hilarie Fredrickson MD   Signed by:   Wyona Almas RN on 06/01/2007   Method used:   Electronically sent to ...       [facility] [phone][facility]       [address]       [place], [place]  [address]       Ph: [phone] or [phone]       Fax: [phone]   RxID:   [id number]

## 2010-02-04 NOTE — Letter (Signed)
Summary: [facility]  [facility]   Imported By: Roderic Ovens 01/23/2009 11:17:14  _____________________________________________________________________  External Attachment:    Type:   Image     Comment:   External Document

## 2010-02-04 NOTE — Progress Notes (Signed)
  Phone Note From Other Clinic   Caller: [name] Call For: Stress Test    Faxed Stress over to [employer] @ fax [phone] [facility]  November 18, 2008 11:00 AM

## 2010-03-21 LAB — URINALYSIS, ROUTINE W REFLEX MICROSCOPIC
Bilirubin Urine: NEGATIVE
Glucose, UA: NEGATIVE mg/dL
Hgb urine dipstick: NEGATIVE
Ketones, ur: NEGATIVE mg/dL
Nitrite: NEGATIVE
Protein, ur: NEGATIVE mg/dL
Specific Gravity, Urine: 1.03 (ref 1.005–1.030)
Urobilinogen, UA: 0.2 mg/dL (ref 0.0–1.0)
pH: 5.5 (ref 5.0–8.0)

## 2010-03-21 LAB — BASIC METABOLIC PANEL
BUN: 13 mg/dL (ref 6–23)
CO2: 26 mEq/L (ref 19–32)
Calcium: 9.3 mg/dL (ref 8.4–10.5)
Chloride: 104 mEq/L (ref 96–112)
Creatinine, Ser: 1.02 mg/dL (ref 0.4–1.2)
GFR calc Af Amer: >60 mL/min (ref >60)
GFR calc non Af Amer: 57 mL/min — ABNORMAL LOW (ref >60)
Glucose, Bld: 110 mg/dL — ABNORMAL HIGH (ref 70–99)
Potassium: 4.6 mEq/L (ref 3.5–5.1)
Sodium: 138 mEq/L (ref 135–145)

## 2010-03-21 LAB — CBC
HCT: 30.3 % — ABNORMAL LOW (ref 36.0–46.0)
Hemoglobin: 10.1 g/dL — ABNORMAL LOW (ref 12.0–15.0)
MCHC: 33.5 g/dL (ref 30.0–36.0)
MCV: 108.6 fL — ABNORMAL HIGH (ref 78.0–100.0)
Platelets: 416 10*3/uL — ABNORMAL HIGH (ref 150–400)
RBC: 2.79 MIL/uL — ABNORMAL LOW (ref 3.87–5.11)
RDW: 14.4 % (ref 11.5–15.5)
WBC: 7.5 10*3/uL (ref 4.0–10.5)

## 2010-03-21 LAB — GRAM STAIN

## 2010-03-21 LAB — ANAEROBIC CULTURE

## 2010-03-21 LAB — WOUND CULTURE: Culture: NO GROWTH

## 2010-03-21 LAB — PROTIME-INR
INR: 0.99 (ref 0.00–1.49)
Prothrombin Time: 13 seconds (ref 11.6–15.2)

## 2010-03-21 LAB — APTT: aPTT: 31 seconds (ref 24–37)

## 2010-04-06 LAB — PROTIME-INR
INR: 1.02 (ref 0.00–1.49)
Prothrombin Time: 13.3 seconds (ref 11.6–15.2)

## 2010-04-06 LAB — CBC
HCT: 33.3 % — ABNORMAL LOW (ref 36.0–46.0)
Hemoglobin: 11.4 g/dL — ABNORMAL LOW (ref 12.0–15.0)
MCHC: 34.1 g/dL (ref 30.0–36.0)
MCV: 111 fL — ABNORMAL HIGH (ref 78.0–100.0)
Platelets: ADEQUATE 10*3/uL (ref 150–400)
RBC: 3 MIL/uL — ABNORMAL LOW (ref 3.87–5.11)
RDW: 15.4 % (ref 11.5–15.5)
WBC: 9.6 10*3/uL (ref 4.0–10.5)

## 2010-04-06 LAB — BASIC METABOLIC PANEL
BUN: 9 mg/dL (ref 6–23)
CO2: 23 mEq/L (ref 19–32)
Calcium: 9.3 mg/dL (ref 8.4–10.5)
Chloride: 103 mEq/L (ref 96–112)
Creatinine, Ser: 1.06 mg/dL (ref 0.4–1.2)
GFR calc Af Amer: >60 mL/min (ref >60)
GFR calc non Af Amer: 54 mL/min — ABNORMAL LOW (ref >60)
Glucose, Bld: 102 mg/dL — ABNORMAL HIGH (ref 70–99)
Potassium: 4.7 mEq/L (ref 3.5–5.1)
Sodium: 137 mEq/L (ref 135–145)

## 2010-04-06 LAB — URINALYSIS, ROUTINE W REFLEX MICROSCOPIC
Bilirubin Urine: NEGATIVE
Glucose, UA: NEGATIVE mg/dL
Hgb urine dipstick: NEGATIVE
Ketones, ur: NEGATIVE mg/dL
Nitrite: NEGATIVE
Protein, ur: NEGATIVE mg/dL
Specific Gravity, Urine: 1.027 (ref 1.005–1.030)
Urobilinogen, UA: 0.2 mg/dL (ref 0.0–1.0)
pH: 5.5 (ref 5.0–8.0)

## 2010-04-06 LAB — APTT: aPTT: 31 seconds (ref 24–37)

## 2010-04-15 LAB — COMPREHENSIVE METABOLIC PANEL
ALT: 37 U/L — ABNORMAL HIGH (ref 0–35)
AST: 22 U/L (ref 0–37)
Albumin: 3.2 g/dL — ABNORMAL LOW (ref 3.5–5.2)
Alkaline Phosphatase: 62 U/L (ref 39–117)
BUN: 14 mg/dL (ref 6–23)
CO2: 31 mEq/L (ref 19–32)
Calcium: 8.7 mg/dL (ref 8.4–10.5)
Chloride: 102 mEq/L (ref 96–112)
Creatinine, Ser: 0.95 mg/dL (ref 0.4–1.2)
GFR calc Af Amer: >60 mL/min (ref >60)
GFR calc non Af Amer: >60 mL/min (ref >60)
Glucose, Bld: 122 mg/dL — ABNORMAL HIGH (ref 70–99)
Potassium: 4.3 mEq/L (ref 3.5–5.1)
Sodium: 138 mEq/L (ref 135–145)
Total Bilirubin: 0.6 mg/dL (ref 0.3–1.2)
Total Protein: 6.4 g/dL (ref 6.0–8.3)

## 2010-04-15 LAB — CBC
HCT: 32.4 % — ABNORMAL LOW (ref 36.0–46.0)
HCT: 34 % — ABNORMAL LOW (ref 36.0–46.0)
Hemoglobin: 11.2 g/dL — ABNORMAL LOW (ref 12.0–15.0)
Hemoglobin: 11.7 g/dL — ABNORMAL LOW (ref 12.0–15.0)
MCHC: 34.5 g/dL (ref 30.0–36.0)
MCHC: 34.5 g/dL (ref 30.0–36.0)
MCV: 106.2 fL — ABNORMAL HIGH (ref 78.0–100.0)
MCV: 106.4 fL — ABNORMAL HIGH (ref 78.0–100.0)
Platelets: 338 10*3/uL (ref 150–400)
Platelets: 370 10*3/uL (ref 150–400)
RBC: 3.05 MIL/uL — ABNORMAL LOW (ref 3.87–5.11)
RBC: 3.19 MIL/uL — ABNORMAL LOW (ref 3.87–5.11)
RDW: 16.3 % — ABNORMAL HIGH (ref 11.5–15.5)
RDW: 16.3 % — ABNORMAL HIGH (ref 11.5–15.5)
WBC: 5.6 10*3/uL (ref 4.0–10.5)
WBC: 6.9 10*3/uL (ref 4.0–10.5)

## 2010-04-15 LAB — BASIC METABOLIC PANEL
BUN: 14 mg/dL (ref 6–23)
CO2: 25 mEq/L (ref 19–32)
Calcium: 8.9 mg/dL (ref 8.4–10.5)
Chloride: 101 mEq/L (ref 96–112)
Creatinine, Ser: 1.02 mg/dL (ref 0.4–1.2)
GFR calc Af Amer: >60 mL/min (ref >60)
GFR calc non Af Amer: 57 mL/min — ABNORMAL LOW (ref >60)
Glucose, Bld: 104 mg/dL — ABNORMAL HIGH (ref 70–99)
Potassium: 3.9 mEq/L (ref 3.5–5.1)
Sodium: 135 mEq/L (ref 135–145)

## 2010-04-15 LAB — POCT CARDIAC MARKERS
CKMB, poc: <1 ng/mL — ABNORMAL LOW (ref 1.0–8.0)
CKMB, poc: <1 ng/mL — ABNORMAL LOW (ref 1.0–8.0)
Myoglobin, poc: 48 ng/mL (ref 12–200)
Myoglobin, poc: 52.2 ng/mL (ref 12–200)
Troponin i, poc: <0.05 ng/mL (ref 0.00–0.09)
Troponin i, poc: <0.05 ng/mL (ref 0.00–0.09)

## 2010-04-15 LAB — PROTIME-INR
INR: 2.4 — ABNORMAL HIGH (ref 0.00–1.49)
INR: 2.6 — ABNORMAL HIGH (ref 0.00–1.49)
Prothrombin Time: 27.8 seconds — ABNORMAL HIGH (ref 11.6–15.2)
Prothrombin Time: 29.7 seconds — ABNORMAL HIGH (ref 11.6–15.2)

## 2010-04-15 LAB — DIFFERENTIAL
Basophils Absolute: 0.1 10*3/uL (ref 0.0–0.1)
Basophils Relative: 1 % (ref 0–1)
Eosinophils Absolute: 0.1 10*3/uL (ref 0.0–0.7)
Eosinophils Relative: 1 % (ref 0–5)
Lymphocytes Relative: 30 % (ref 12–46)
Lymphs Abs: 2.1 10*3/uL (ref 0.7–4.0)
Monocytes Absolute: 0.5 10*3/uL (ref 0.1–1.0)
Monocytes Relative: 7 % (ref 3–12)
Neutro Abs: 4.2 10*3/uL (ref 1.7–7.7)
Neutrophils Relative %: 61 % (ref 43–77)

## 2010-04-15 LAB — LUPUS ANTICOAGULANT PANEL
DRVVT: 37.6 secs (ref 36.1–47.0)
Lupus Anticoagulant: NOT DETECTED
PTT Lupus Anticoagulant: 57.3 secs — ABNORMAL HIGH (ref 36.3–48.8)
PTTLA 4:1 Mix: 49.4 secs — ABNORMAL HIGH (ref 36.3–48.8)
PTTLA Confirmation: 2.7 secs (ref <8.0)

## 2010-04-15 LAB — CK TOTAL AND CKMB (NOT AT ARMC)
CK, MB: 0.7 ng/mL (ref 0.3–4.0)
Relative Index: INVALID (ref 0.0–2.5)
Total CK: 21 U/L (ref 7–177)

## 2010-04-15 LAB — CARDIAC PANEL(CRET KIN+CKTOT+MB+TROPI)
CK, MB: 0.9 ng/mL (ref 0.3–4.0)
Relative Index: INVALID (ref 0.0–2.5)
Total CK: 25 U/L (ref 7–177)
Troponin I: <0.01 ng/mL (ref 0.00–0.06)

## 2010-04-15 LAB — TROPONIN I: Troponin I: <0.01 ng/mL (ref 0.00–0.06)

## 2010-04-19 LAB — CBC
HCT: 32.4 % — ABNORMAL LOW (ref 36.0–46.0)
HCT: 32.9 % — ABNORMAL LOW (ref 36.0–46.0)
HCT: 33.9 % — ABNORMAL LOW (ref 36.0–46.0)
HCT: 34.2 % — ABNORMAL LOW (ref 36.0–46.0)
Hemoglobin: 11 g/dL — ABNORMAL LOW (ref 12.0–15.0)
Hemoglobin: 11.1 g/dL — ABNORMAL LOW (ref 12.0–15.0)
Hemoglobin: 11.4 g/dL — ABNORMAL LOW (ref 12.0–15.0)
Hemoglobin: 11.4 g/dL — ABNORMAL LOW (ref 12.0–15.0)
MCHC: 33.5 g/dL (ref 30.0–36.0)
MCHC: 33.8 g/dL (ref 30.0–36.0)
MCHC: 33.8 g/dL (ref 30.0–36.0)
MCHC: 34 g/dL (ref 30.0–36.0)
MCV: 105.3 fL — ABNORMAL HIGH (ref 78.0–100.0)
MCV: 105.8 fL — ABNORMAL HIGH (ref 78.0–100.0)
MCV: 106.9 fL — ABNORMAL HIGH (ref 78.0–100.0)
MCV: 106.9 fL — ABNORMAL HIGH (ref 78.0–100.0)
Platelets: 267 10*3/uL (ref 150–400)
Platelets: 280 10*3/uL (ref 150–400)
Platelets: 289 10*3/uL (ref 150–400)
Platelets: ADEQUATE 10*3/uL (ref 150–400)
RBC: 3.07 MIL/uL — ABNORMAL LOW (ref 3.87–5.11)
RBC: 3.11 MIL/uL — ABNORMAL LOW (ref 3.87–5.11)
RBC: 3.17 MIL/uL — ABNORMAL LOW (ref 3.87–5.11)
RBC: 3.2 MIL/uL — ABNORMAL LOW (ref 3.87–5.11)
RDW: 15.6 % — ABNORMAL HIGH (ref 11.5–15.5)
RDW: 15.8 % — ABNORMAL HIGH (ref 11.5–15.5)
RDW: 15.9 % — ABNORMAL HIGH (ref 11.5–15.5)
RDW: 15.9 % — ABNORMAL HIGH (ref 11.5–15.5)
WBC: 5.7 10*3/uL (ref 4.0–10.5)
WBC: 5.9 10*3/uL (ref 4.0–10.5)
WBC: 6.3 10*3/uL (ref 4.0–10.5)
WBC: 7.9 10*3/uL (ref 4.0–10.5)

## 2010-04-19 LAB — DIFFERENTIAL
Basophils Absolute: 0.2 10*3/uL — ABNORMAL HIGH (ref 0.0–0.1)
Basophils Relative: 2 % — ABNORMAL HIGH (ref 0–1)
Eosinophils Absolute: 0.2 10*3/uL (ref 0.0–0.7)
Eosinophils Relative: 2 % (ref 0–5)
Lymphocytes Relative: 28 % (ref 12–46)
Lymphs Abs: 2.2 10*3/uL (ref 0.7–4.0)
Monocytes Absolute: 0.7 10*3/uL (ref 0.1–1.0)
Monocytes Relative: 9 % (ref 3–12)
Neutro Abs: 4.6 10*3/uL (ref 1.7–7.7)
Neutrophils Relative %: 59 % (ref 43–77)

## 2010-04-19 LAB — BASIC METABOLIC PANEL
BUN: 13 mg/dL (ref 6–23)
BUN: 6 mg/dL (ref 6–23)
CO2: 22 mEq/L (ref 19–32)
CO2: 25 mEq/L (ref 19–32)
Calcium: 8.7 mg/dL (ref 8.4–10.5)
Calcium: 8.9 mg/dL (ref 8.4–10.5)
Chloride: 102 mEq/L (ref 96–112)
Chloride: 103 mEq/L (ref 96–112)
Creatinine, Ser: 0.8 mg/dL (ref 0.4–1.2)
Creatinine, Ser: 0.96 mg/dL (ref 0.4–1.2)
GFR calc Af Amer: >60 mL/min (ref >60)
GFR calc Af Amer: >60 mL/min (ref >60)
GFR calc non Af Amer: >60 mL/min (ref >60)
GFR calc non Af Amer: >60 mL/min (ref >60)
Glucose, Bld: 107 mg/dL — ABNORMAL HIGH (ref 70–99)
Glucose, Bld: 99 mg/dL (ref 70–99)
Potassium: 3.6 mEq/L (ref 3.5–5.1)
Potassium: 4 mEq/L (ref 3.5–5.1)
Sodium: 135 mEq/L (ref 135–145)
Sodium: 138 mEq/L (ref 135–145)

## 2010-04-19 LAB — PROTIME-INR
INR: 1 (ref 0.00–1.49)
INR: 1 (ref 0.00–1.49)
INR: 1.3 (ref 0.00–1.49)
INR: 2.4 — ABNORMAL HIGH (ref 0.00–1.49)
INR: 2.7 — ABNORMAL HIGH (ref 0.00–1.49)
Prothrombin Time: 13.1 seconds (ref 11.6–15.2)
Prothrombin Time: 13.2 seconds (ref 11.6–15.2)
Prothrombin Time: 16.6 seconds — ABNORMAL HIGH (ref 11.6–15.2)
Prothrombin Time: 28 seconds — ABNORMAL HIGH (ref 11.6–15.2)
Prothrombin Time: 30.9 seconds — ABNORMAL HIGH (ref 11.6–15.2)

## 2010-04-19 LAB — PROTEIN C, TOTAL: Protein C, Total: 80 % (ref 70–140)

## 2010-04-19 LAB — COMPREHENSIVE METABOLIC PANEL
ALT: 30 U/L (ref 0–35)
AST: 22 U/L (ref 0–37)
Albumin: 3.6 g/dL (ref 3.5–5.2)
Alkaline Phosphatase: 60 U/L (ref 39–117)
BUN: 7 mg/dL (ref 6–23)
CO2: 29 mEq/L (ref 19–32)
Calcium: 9.3 mg/dL (ref 8.4–10.5)
Chloride: 105 mEq/L (ref 96–112)
Creatinine, Ser: 0.96 mg/dL (ref 0.4–1.2)
GFR calc Af Amer: >60 mL/min (ref >60)
GFR calc non Af Amer: >60 mL/min (ref >60)
Glucose, Bld: 104 mg/dL — ABNORMAL HIGH (ref 70–99)
Potassium: 3.6 mEq/L (ref 3.5–5.1)
Sodium: 141 mEq/L (ref 135–145)
Total Bilirubin: 0.8 mg/dL (ref 0.3–1.2)
Total Protein: 7.1 g/dL (ref 6.0–8.3)

## 2010-04-19 LAB — APTT: aPTT: 25 seconds (ref 24–37)

## 2010-04-19 LAB — PROTHROMBIN GENE MUTATION

## 2010-04-19 LAB — HEMOGLOBIN A1C
Hgb A1c MFr Bld: 5.7 % (ref 4.6–6.1)
Mean Plasma Glucose: 117 mg/dL

## 2010-04-19 LAB — PROTEIN S, TOTAL: Protein S Ag, Total: 97 % (ref 70–140)

## 2010-04-19 LAB — LIPASE, BLOOD: Lipase: 18 U/L (ref 11–59)

## 2010-04-19 LAB — FACTOR 5 LEIDEN

## 2010-04-19 LAB — HOMOCYSTEINE: Homocysteine: 12.8 umol/L (ref 4.0–15.4)

## 2010-04-19 LAB — ANTITHROMBIN III: AntiThromb III Func: 94 % (ref 76–126)

## 2010-04-20 LAB — CARDIAC PANEL(CRET KIN+CKTOT+MB+TROPI)
CK, MB: 1 ng/mL (ref 0.3–4.0)
CK, MB: 1.2 ng/mL (ref 0.3–4.0)
CK, MB: 1.6 ng/mL (ref 0.3–4.0)
Relative Index: INVALID (ref 0.0–2.5)
Relative Index: INVALID (ref 0.0–2.5)
Relative Index: INVALID (ref 0.0–2.5)
Total CK: 29 U/L (ref 7–177)
Total CK: 33 U/L (ref 7–177)
Total CK: 35 U/L (ref 7–177)
Troponin I: 0.01 ng/mL (ref 0.00–0.06)
Troponin I: 0.02 ng/mL (ref 0.00–0.06)
Troponin I: <0.01 ng/mL (ref 0.00–0.06)

## 2010-04-20 LAB — URINALYSIS, MICROSCOPIC ONLY
Bilirubin Urine: NEGATIVE
Glucose, UA: NEGATIVE mg/dL
Hgb urine dipstick: NEGATIVE
Ketones, ur: NEGATIVE mg/dL
Leukocytes, UA: NEGATIVE
Nitrite: NEGATIVE
Protein, ur: NEGATIVE mg/dL
Specific Gravity, Urine: 1.009 (ref 1.005–1.030)
Urobilinogen, UA: 0.2 mg/dL (ref 0.0–1.0)
pH: 6.5 (ref 5.0–8.0)

## 2010-04-20 LAB — LIPID PANEL
Cholesterol: 208 mg/dL — ABNORMAL HIGH (ref 0–200)
HDL: 35 mg/dL — ABNORMAL LOW (ref >39)
LDL Cholesterol: 152 mg/dL — ABNORMAL HIGH (ref 0–99)
Total CHOL/HDL Ratio: 5.9 RATIO
Triglycerides: 107 mg/dL (ref <150)
VLDL: 21 mg/dL (ref 0–40)

## 2010-04-20 LAB — BASIC METABOLIC PANEL
BUN: 12 mg/dL (ref 6–23)
CO2: 26 mEq/L (ref 19–32)
Calcium: 8.5 mg/dL (ref 8.4–10.5)
Chloride: 95 mEq/L — ABNORMAL LOW (ref 96–112)
Creatinine, Ser: 1.04 mg/dL (ref 0.4–1.2)
GFR calc Af Amer: >60 mL/min (ref >60)
GFR calc non Af Amer: 56 mL/min — ABNORMAL LOW (ref >60)
Glucose, Bld: 94 mg/dL (ref 70–99)
Potassium: 3.9 mEq/L (ref 3.5–5.1)
Sodium: 130 mEq/L — ABNORMAL LOW (ref 135–145)

## 2010-04-20 LAB — CBC
HCT: 31 % — ABNORMAL LOW (ref 36.0–46.0)
HCT: 32.5 % — ABNORMAL LOW (ref 36.0–46.0)
Hemoglobin: 10.8 g/dL — ABNORMAL LOW (ref 12.0–15.0)
Hemoglobin: 11.4 g/dL — ABNORMAL LOW (ref 12.0–15.0)
MCHC: 34.8 g/dL (ref 30.0–36.0)
MCHC: 34.9 g/dL (ref 30.0–36.0)
MCV: 105.2 fL — ABNORMAL HIGH (ref 78.0–100.0)
MCV: 106.2 fL — ABNORMAL HIGH (ref 78.0–100.0)
Platelets: 303 10*3/uL (ref 150–400)
Platelets: 335 10*3/uL (ref 150–400)
RBC: 2.94 MIL/uL — ABNORMAL LOW (ref 3.87–5.11)
RBC: 3.06 MIL/uL — ABNORMAL LOW (ref 3.87–5.11)
RDW: 15.6 % — ABNORMAL HIGH (ref 11.5–15.5)
RDW: 15.7 % — ABNORMAL HIGH (ref 11.5–15.5)
WBC: 12 10*3/uL — ABNORMAL HIGH (ref 4.0–10.5)
WBC: 15.2 10*3/uL — ABNORMAL HIGH (ref 4.0–10.5)

## 2010-04-20 LAB — TSH: TSH: 1.273 u[IU]/mL (ref 0.350–4.500)

## 2010-04-20 LAB — CK TOTAL AND CKMB (NOT AT ARMC)
CK, MB: 0.9 ng/mL (ref 0.3–4.0)
Relative Index: INVALID (ref 0.0–2.5)
Total CK: 22 U/L (ref 7–177)

## 2010-04-20 LAB — PROTIME-INR
INR: 1.4 (ref 0.00–1.49)
INR: 1.8 — ABNORMAL HIGH (ref 0.00–1.49)
INR: 2 — ABNORMAL HIGH (ref 0.00–1.49)
Prothrombin Time: 18.2 seconds — ABNORMAL HIGH (ref 11.6–15.2)
Prothrombin Time: 22.2 seconds — ABNORMAL HIGH (ref 11.6–15.2)
Prothrombin Time: 24.3 seconds — ABNORMAL HIGH (ref 11.6–15.2)

## 2010-04-20 LAB — POCT CARDIAC MARKERS
CKMB, poc: <1 ng/mL — ABNORMAL LOW (ref 1.0–8.0)
CKMB, poc: <1 ng/mL — ABNORMAL LOW (ref 1.0–8.0)
Myoglobin, poc: 59.6 ng/mL (ref 12–200)
Myoglobin, poc: 62.1 ng/mL (ref 12–200)
Troponin i, poc: <0.05 ng/mL (ref 0.00–0.09)
Troponin i, poc: <0.05 ng/mL (ref 0.00–0.09)

## 2010-04-20 LAB — URINE CULTURE: Colony Count: 6000

## 2010-04-20 LAB — CULTURE, BLOOD (ROUTINE X 2)
Culture: NO GROWTH
Culture: NO GROWTH

## 2010-04-20 LAB — COMPREHENSIVE METABOLIC PANEL
ALT: 31 U/L (ref 0–35)
AST: 19 U/L (ref 0–37)
Albumin: 3 g/dL — ABNORMAL LOW (ref 3.5–5.2)
Alkaline Phosphatase: 73 U/L (ref 39–117)
BUN: 11 mg/dL (ref 6–23)
CO2: 28 mEq/L (ref 19–32)
Calcium: 8.2 mg/dL — ABNORMAL LOW (ref 8.4–10.5)
Chloride: 99 mEq/L (ref 96–112)
Creatinine, Ser: 0.98 mg/dL (ref 0.4–1.2)
GFR calc Af Amer: >60 mL/min (ref >60)
GFR calc non Af Amer: 60 mL/min — ABNORMAL LOW (ref >60)
Glucose, Bld: 112 mg/dL — ABNORMAL HIGH (ref 70–99)
Potassium: 3.8 mEq/L (ref 3.5–5.1)
Sodium: 132 mEq/L — ABNORMAL LOW (ref 135–145)
Total Bilirubin: 0.7 mg/dL (ref 0.3–1.2)
Total Protein: 6.2 g/dL (ref 6.0–8.3)

## 2010-04-20 LAB — HEMOGLOBIN A1C
Hgb A1c MFr Bld: 6.2 % — ABNORMAL HIGH (ref 4.6–6.1)
Mean Plasma Glucose: 131 mg/dL

## 2010-04-20 LAB — RAPID URINE DRUG SCREEN, HOSP PERFORMED
Amphetamines: NOT DETECTED
Barbiturates: NOT DETECTED
Benzodiazepines: NOT DETECTED
Cocaine: NOT DETECTED
Opiates: NOT DETECTED
Tetrahydrocannabinol: NOT DETECTED

## 2010-04-20 LAB — DIFFERENTIAL
Basophils Absolute: 0 10*3/uL (ref 0.0–0.1)
Basophils Relative: 0 % (ref 0–1)
Eosinophils Absolute: 0.1 10*3/uL (ref 0.0–0.7)
Eosinophils Relative: 1 % (ref 0–5)
Lymphocytes Relative: 11 % — ABNORMAL LOW (ref 12–46)
Lymphs Abs: 1.6 10*3/uL (ref 0.7–4.0)
Monocytes Absolute: 0.1 10*3/uL (ref 0.1–1.0)
Monocytes Relative: 1 % — ABNORMAL LOW (ref 3–12)
Neutro Abs: 13.3 10*3/uL — ABNORMAL HIGH (ref 1.7–7.7)
Neutrophils Relative %: 88 % — ABNORMAL HIGH (ref 43–77)

## 2010-04-20 LAB — BRAIN NATRIURETIC PEPTIDE: Pro B Natriuretic peptide (BNP): 41 pg/mL (ref 0.0–100.0)

## 2010-04-20 LAB — PHOSPHORUS: Phosphorus: 3.3 mg/dL (ref 2.3–4.6)

## 2010-04-20 LAB — HOMOCYSTEINE: Homocysteine: 9.5 umol/L (ref 4.0–15.4)

## 2010-04-20 LAB — APTT: aPTT: 37 seconds (ref 24–37)

## 2010-04-20 LAB — TROPONIN I: Troponin I: <0.01 ng/mL (ref 0.00–0.06)

## 2010-04-20 LAB — MAGNESIUM: Magnesium: 2 mg/dL (ref 1.5–2.5)

## 2010-05-13 ENCOUNTER — Encounter: Payer: Self-pay | Admitting: Cardiovascular Disease

## 2010-05-18 NOTE — Assessment & Plan Note (Signed)
[facility]                        [facility] OFFICE NOTE   NAME:[name], [name]                         MRN:          [id number]  DATE:11/19/2008                            DOB:          09-20-1955    CHIEF COMPLAINT:  Preoperative evaluation.   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  The patient was initially diagnosed with  bilateral pulmonary emboli in January of this year.  At that time, her  blood work was negative for factor V Leiden mutation, protein C, and  protein S.  Blood work was also negative for a prothrombin gene mutation  and her homocystine level was normal.  The patient states that she has  had persistent dyspnea on exertion and chest discomfort.  The patient  states that the chest discomfort is always present, is not worsened, or  alleviated by any activity or position.  She has chronic fatigue and  dyspnea on exertion that has not really changed over the past year.  She  had a CT scan of the chest in February of this year that showed  resolution of the initial pulmonary emboli and no evidence of additional  pulmonary emboli.  However, there was a suboptimal pacification of the  pulmonary arteries.  The patient also underwent a subsequent nuclear  stress test the results of which are not currently available.  Other  than the chest discomfort and chronic fatigue and shortness of breath,  the patient states that her entire body hurts her most of the time.  She  is compliant with her medications.  She has been followed by Dr. Nedra Hai for  quite sometime.  However, is going to making a switch to Dr. Sudie Bailey.  Dr. Charmayne Sheer is planning on operating on L2-L3 in order to  address her  ruptured disk that is causing her significant amounts of discomfort.   PAST MEDICAL HISTORY:  As above in HPI.  In addition, the patient has a  history of nephrolithiasis, morbid obesity, migraine headaches.  She is  status post carpal tunnel release, bilateral tubal ligation.   SOCIAL HISTORY:  No tobacco very rare alcohol.   FAMILY HISTORY:  Positive for blood clots and that her brother has had a  pulmonary emboli.   ALLERGIES:  SHELLFISH causes swelling, PINEAPPLE also causes swelling.   MEDICATIONS:  1. Aspirin 81 mg daily.  2. Atenolol 100 mg b.i.d.  3. Nexium 40 mg daily.  4. Cymbalta b.i.d.  5. Celexa 40 mg daily.  6. Vitamin D with calcium.   REVIEW OF SYSTEMS:  As in HPI.  The patient also endorses depression.  She is disabled from her fibromyalgia, depression, and back pain.  Other  systems as in HPI, otherwise negative.   PHYSICAL EXAMINATION:  VITAL SIGNS:  Blood pressure 124/88, pulse 92,  she weighs 250 pounds.  She is sating  93% on room air.  GENERAL:  No acute distress.  HEENT:  Nonfocal.  NECK:  Supple.  There are no carotid bruits.  HEART:  Regular rate and rhythm without murmur, rub, or gallop.  LUNGS:  Clear bilaterally.  ABDOMEN:  Soft.  There is some tenderness in the right lower quadrant  that she states is chronic in nature.  There is no rebound.  EXTREMITIES:  Without edema.  SKIN:  Cool and dry.  NEURO:  Nonfocal.  MUSCULOSKELETAL:  The patient has 5/5 bilateral upper and lower  extremity strength.   Review of the patient's CT scan findings as above in HPI.  Of note, the  patient also had a echocardiogram in January of this year which showed  an EF of 55-65% mild left ventricular hypertrophy and diastolic  dysfunction.  Review of the medical record, the patient's medical record  as above in HPI.  Of note, she was placed on Coumadin in January 2010  and taken off the Coumadin in October of this year by her primary care  physician,  Dr. Nedra Hai.   ASSESSMENT:  A 55 year old white female with morbid obesity,  fibromyalgia, and a history of bilateral pulmonary embolism, who is not  having any symptoms consistent with angina.  The patient's chest  discomfort is chronic in nature and it is very unlikely to be related to  coronary disease.  More likely, it is related to her chronic  fibromyalgia.  More information regarding the workup for her bilateral  pulmonary emboli will be needed.   PLAN:  We will obtain the records from Dr. Marigene Ehlers office which will  hopefully provide some clarification as to the etiology of her pulmonary  emboli.  We will also need to get the results of the stress test  performed within the past year that was negative per her report.  It is  likely that we will repeat a transthoracic echocardiogram in order to  assure that she has normal left ventricular systolic function and to  assess for any dilatation of her right heart that may occur secondary to  chronic pulmonary emboli.  It may also be reasonable to repeat imaging  either of her lower extremity venous system or perhaps her pulmonary  arteries to rule out any recurrent thrombosis as she is still having  persistent chest discomfort.  For now, she is to continue on the  medications as listed above.  We will contact the patient once results  of her prior studies and workup are reviewed.     Brayton El, MD  Electronically Signed    SGA/MedQ  DD: 11/19/2008  DT: 11/20/2008  Job #: [id number]

## 2010-05-18 NOTE — H&P (Signed)
[name], [name]                ACCOUNT NO.:  [id number]   MEDICAL RECORD NO.:  [id number]          PATIENT TYPE:  INP   LOCATION:  2023                         FACILITY:  [facility]   PHYSICIAN:  Rollene Rotunda, MD, FACCDATE OF BIRTH:  04/08/1955   DATE OF ADMISSION:  03/05/2008  DATE OF DISCHARGE:                              HISTORY & PHYSICAL   PRIMARY CARDIOLOGIST:  Rollene Rotunda, MD, [facility]   PRIMARY CARE Brendt Dible:  Dr. Simone Curia in [place].   PATIENT PROFILE:  A 55 year old obese female without prior cardiac  history who presents with recurrent chest pain.   PROBLEMS:  1. Chest pain.  2. History of bilateral PE diagnosed on January 23, 2008, on Coumadin.  3. History of fibromyalgia, diagnosed approximately 6 months ago.  4. History of syncope about a month ago.      a.     January 25, 2008, 2D echocardiogram EF 55-65%, mild LVH, and       diastolic dysfunction.  5. History of nephrolithiasis.  6. Degenerative joint disease.  7. Morbid obesity.  8. Hypertension.  9. History of migraine headaches.  10.History of left-sided Bell palsy.  11.Status post carpal tunnel release.  12.Status post bilateral tubal ligation.   HISTORY OF PRESENT ILLNESS:  A 55 year old married female with prior  cardiac history.  She was in usual state of health approximately six  months to a year ago which began to experience intermittent back and  focal chest and abdominal discomfort.  She subsequently diagnosed with  fibromyalgia about 6 months ago.  She has also been having progressive  dyspnea on exertion and underwent chest x-ray on January 18, 2008, that  showed a right infrahilar density.  She was subsequently set up for an  outpatient CT scan in January 23, 2008, to rule out malignancy.  However, this showed bilateral pulmonary embolism.  The patient was  subsequently admitted to the [facility] and initiated Lovenox and  Coumadin therapy.  She had a normal protein C, protein S,  antithrombin  III, and negative factor V Leiden.  She was subsequently discharged home  on Lovenox and Coumadin on January 27, 2008.  Approximately 2 weeks  later, the patient reports she was at home walking across her home when  she suddenly felt very lightheaded and everything went black.  She fell  to the floor striking her shoulder on her hardwood floors.  Husband  heard her fall and attended to her quickly and she quickly regained  consciousness.  There was no loss of bowel or bladder function.  There  was no witnessed seizure activity.  The patient sustained no significant  injuries.  Because she did not bang her head, she did not seek medical  care.  She has had no recurrent syncopal episode, but notes that since  her diagnosis of pulmonary embolism, she has been having intermittent  rest and exertional substernal chest discomfort rated at 2-4 on a scale  of 10, that is worse with deep breathing and palpation.  This occurs 2-3  times per week, sometimes associated with dyspnea, diaphoresis, nausea,  or lightheadedness and lasts anywhere from minutes to hours and resolves  spontaneously.  Because of this chest discomfort, she was admitted to  [facility] on February 22, 2008, and subsequently ruled out.  She was  set up to see Dr. Antoine Poche as an outpatient.  She is continued to have  chest discomfort as described previously and today while at work had an  episode associated with diaphoresis.  She presented to the [facility]  ED.  ECG shows no acute changes, although she is mildly tachycardia.  Enzymes are negative and INR is therapeutic.  She currently is pain  free.   ALLERGIES:  No known drug allergies.   HOME MEDICATIONS:  1. Atenolol 50 mg daily.  2. Cymbalta 40 mg b.i.d.  3. Nexium 40 mg daily.  4. Neurontin 900 mg t.i.d.  5. Multivitamin daily.  6. Vitamin D daily.  7. Coumadin as prescribed.  8. Aspirin.   FAMILY HISTORY:  Father died of Alzheimer's at 32, although he  had an MI  at 61 and again at 2.  She has two brothers, one of which has a history  of PEs.   SOCIAL HISTORY:  She lives in [place] with her husband.  She works at  [employer] at [employer].  She denies tobacco or drug  use.  She rarely will have an alcoholic beverage maybe once every 3  months or less.  She does not routinely exercise.   REVIEW OF SYSTEMS:  Positive for chest pain, syncope, as outlined in the  HPI.  She has had weakness and easy fatigability.  She has chronic  dyspnea and exertion.  Otherwise, all systems are reviewed and negative.  She is a full code.   PHYSICAL EXAMINATION:  VITAL SIGNS:  Temperature 98.9, heart rate 106-  113, respirations 18, blood pressure 103/54, pulse ox 98% on room air.  She is 270 pounds.  GENERAL:  Pleasant white female in no acute distress.  Awake, alert, and  oriented x3.  HEENT:  Normal.  Nares grossly intact and nonfocal.  SKIN:  Warm and dry without lesions or masses.  PSYCHIATRIC:  Normal affect.  MUSCULOSKELETAL:  Grossly normal without deformity or effusion.  NECK:  Obese, difficult to assess JVP.  No bruits.  LUNGS:  Respirations are unlabored.  CARDIAC:  Regular S1 and S2.  No S3, S4, or murmurs.  ABDOMEN:  Obese, soft, nontender, and nondistended.  Bowel sounds  present.  EXTREMITIES:  Warm, dry, and pink.  No clubbing or cyanosis.  There is  trace lower extremity edema.  Dorsalis pedis, posterior tibial pulses 2+  and equal bilaterally.   Chest x-ray shows no acute cardiopulmonary findings.  EKG shows sinus  tach at the rate of 107 left axis, no acute ST-T changes.   LABORATORY DATA:  Hemoglobin 11.7, hematocrit 34.0, WBC 6.9, platelets  370.  Sodium 135, potassium 3.9, chloride 101, CO2 of 25, BUN 14,  creatinine 1.02, glucose 104.  INR 2.6.  Cardiac markers negative x2.   ASSESSMENT AND PLAN:  1. Chest pain, somewhat atypical and reproducible with palpation and      deep breathing.  Risk factors  include obesity, family history, and      hypertension.  Plan to admit and cycle cardiac markers.  Carotid      enzymes are negative.  She would need a 2-day Myoview, which can be      performed as an outpatient.  Unfortunately, after-hours we cannot  schedule this right now, so we will keep her n.p.o. after midnight      and try to arrange this for the office in the morning.  2. Hypertension, stable on beta-blocker.  3. Obesity, needs nutritional counseling.  4. History of bilateral pulmonary embolism.  Continue Coumadin.  INR      is therapeutic.  5. Fibromyalgia.  Continue her Cymbalta and Neurontin.  6. Gastroesophageal reflux disease.      Nicolasa Ducking, ANP      Rollene Rotunda, MD, [facility]  Electronically Signed    CB/MEDQ  D:  03/05/2008  T:  03/06/2008  Job:  [id number]

## 2010-05-18 NOTE — H&P (Signed)
[name], [name]                ACCOUNT NO.:  [id number]   MEDICAL RECORD NO.:  [id number]          PATIENT TYPE:  INP   LOCATION:  2006                         FACILITY:  [facility]   PHYSICIAN:  Carlena Hurl, MDDATE OF BIRTH:  1955/03/26   DATE OF ADMISSION:  02/21/2008  DATE OF DISCHARGE:                              HISTORY & PHYSICAL   CHIEF COMPLAINT:  Chest pain.   HISTORY OF PRESENT ILLNESS:  This is a 55 year old pleasant Caucasian  female who has a past medical history significant for PE which was  diagnosed recently on January 23, 2008, and was admitted here and  history of fibromyalgia and history of migraines who is coming in here  with 1-day history of chest pain.  The history is given by the patient.  According to her, she was at the office this afternoon and noticed a  tight chest pain in the substernal region and which was radiating to her  left side of the neck as well as left shoulder and she also felt little  nauseous, but there was no associated shortness of breath.  She said  that as she continued to have chest pain, she talked to her boss, and  decided to come to the ER.  When she came to the ER, she said she was  given some pain medication but not nitroglycerin as she cannot tolerate  nitroglycerin which relieved her pain.  She denies having any vomiting,  shortness of breath, no headache, no dizziness, no back pain, no  diarrhea, no constipation, __________, no fever, no chills.  But lately,  she has been coughing, mostly it is a dry cough and not bringing up  anything.   PAST MEDICAL HISTORY:  Significant for bilateral pulmonary emboli  diagnosed recently on January 13, 2008, and was admitted here, and  started on Coumadin, and history of nephrolithiasis, history of  migraines, and history of degenerative joint disease, fibromyalgia, and  left-sided Bell palsy secondary to some tumor removal surgery in the  past.   ALLERGIES:  No known drug  allergies.   FAMILY HISTORY:  The patient says that her father was diagnosed with  coronary artery disease and initial heart attack was when he was 54  years old and second heart attack was when he was 55 years old, so there  is significant premature coronary artery disease and no other family  members have any significant heart problems and her brother had some  trouble with blood clots.   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  She denies smoking, alcohol, IV drug abuse.   REVIEW OF SYSTEMS:  Pretty much same as in history of present illness.   MEDICATIONS:  The medications that the patient is on at home are  atenolol, Cymbalta, Nexium, Neurontin, multivitamin, vitamin D, and  Coumadin.   PHYSICAL EXAMINATION:  GENERAL:  This is a 55 year old morbidly obese  female who is lying comfortably on the bed without any severe chest pain  or shortness of  breath.  VITAL SIGNS:  On examination, vitals when she came in to the ER 145/75,  later dropped down to 114/56, pulse rate initially was 105, later is 96,  respirations 20, temperature 98.4, saturating 97% on room air.  HEENT:  Head is atraumatic, normocephalic.  Pupils PERRLA.  Tympanic  membranes intact.  No discharge from eyes or ears.  NECK:  Supple.  No JVD appreciated.  No goiter palpated.  LUNGS:  Clear to auscultation bilaterally.  CVS:  S1, S2 heard with regular rate and rhythm.  ABDOMEN:  Soft.  Bowel sounds present.  Nontender, nondistended.  EXTREMITIES:  No pedal edema noted.   LABORATORY DATA:  WBC 15.2, hemoglobin 11.4, hematocrit of 32.5,  neutrophils of 88, absolute neutrophils of 13.3.  INR of 2.0.  Sodium  130, potassium 3.9, chloride 95, bicarb 26, glucose of 94, BUN of 12,  creatinine of 1.04.  First set of troponin-I less than 0.05.  First set  of CK MB less than 1.0.   ASSESSMENT AND PLAN:  This 55 year old  lady with a history of premature  coronary artery disease in the family and history of pulmonary embolism  in the past coming in with 1-day episode of chest pain.   1. Chest pain.  At this time, it looks noncardiac in origin.  So far,      her first set of CK-MB, troponin-I are normal.  There are no      significant EKG changes.  So, we will repeat 2 more sets of      troponin and CK-MB along with EKGs and give her aspirin 81 mg p.o.      1 time a day, and also get fasting lipid profile.  CT chest was      done, which did not show any new pulmonary embolism.  2. Leukocytosis.  The patient had a CT chest done which showed new      multifocal area of airspace disease in the right upper and lower      lobes, which is suspicious for pneumonia, but clinically there is      no fever.  Only there is leukocytosis.  The patient has been      coughing, but mostly a dry cough.  So, I am going to start this      patient on Rocephin and Zithromax at this time because of her      leukocytosis and her history of cough.  So once the patient is      clinically stable, I can discontinue the antibiotics.  3. History of pulmonary embolism which was diagnosed recently in      January 2010.  The patient has been started on Coumadin.  Her INR      is 2.0, so I am going to start this patient on Coumadin of her home      dose.  4. History of fibromyalgia, it has been stable.  5. History of migraines.  The patient denies having any headaches at      this time.  6. History of nephrolithiasis.  It has been stable.  7. Deep vein thrombosis prophylaxis.  The patient is already on      Coumadin and INR is therapeutic.  8. Gastrointestinal prophylaxis.  The patient will be started on      Protonix 40 mg p.o. 1 time a day.      Carlena Hurl, MD  Electronically Signed     JD/MEDQ  D:  02/22/2008  T:  02/22/2008  Job:  [id number]

## 2010-05-18 NOTE — Discharge Summary (Signed)
NAMEJALIYAH, [name]                ACCOUNT NO.:  [id number]   MEDICAL RECORD NO.:  [id number]          PATIENT TYPE:  INP   LOCATION:  2023                         FACILITY:  [facility]   PHYSICIAN:  [name], MDDATE OF BIRTH:  1955/07/14   DATE OF ADMISSION:  03/05/2008  DATE OF DISCHARGE:  03/06/2008                               DISCHARGE SUMMARY   PRIMARY CARDIOLOGIST:  Diana Rotunda, MD, [facility]   PRIMARY CARE [name]:  Diana Curia, MD   DISCHARGE DIAGNOSIS:  Chest pain.   SECONDARY DIAGNOSES:  1. History of bilateral pulmonary emboli, diagnosed on January 23, 2008, now on Coumadin.  2. Fibromyalgia.  3. Nephrolithiasis.  4. Degenerative joint disease.  5. History migraine headaches.  6. History of left-sided Bell palsy.  7. Status post carpal tunnel release.  8. Status post bilateral tubal ligation.  9. Hypertension.  10.Morbid obesity.   ALLERGIES:  No known drug allergies.   PROCEDURES:  None.   HISTORY OF PRESENT ILLNESS:  A 55 year old obese, married female without  prior cardiac history.  She was recently diagnosed with pulmonary emboli  on January 23, 2008 following a 6-12 month history of diffuse back and  chest pains as well as dyspnea on exertion.  She was placed on Coumadin.  She was subsequently readmitted in February with recurrent chest pain.  She ruled out and was discharged home and was scheduled to see Dr.  Antoine Trujillo in the office.  Unfortunately, she has continued to have almost  daily discomfort with rest and exertion and had symptoms that progressed  on the day of admission and became associated with shortness of breath  and diaphoresis.  She presented to the [facility] ED where ECG showed no  acute changes and cardiac markers were negative.  She was admitted for  further evaluation.   HOSPITAL COURSE:  [name] has had no additional chest discomfort since  admission.  Her cardiac markers have remained negative.  We will plan to  discharge her home today and I have arranged for her to have a 2-day  outpatient adenosine Myoview in our office starting this afternoon at  1:00 p.m.   Of note, when [name] was admitted for PE, a hypercoagulable workup  was initiated and she has already had normal protein S, protein C,  antithrombin III, factor V Leiden, homocystine, and prothrombin gene  mutation.  We have drawn lupus anticoagulant as well as anticardiolipin  IgG, IgM, and IgA this morning.  These will be followed up by Dr.  Antoine Trujillo in the office.   DISCHARGE LABORATORY DATA:  Hemoglobin 11.2, hematocrit 33.4, WBC 5.6,  and platelets 338.  INR 2.4.  Sodium 138, potassium 4.3, chloride 102,  CO2 31, BUN 14, creatinine 0.95, and glucose 122.  Total bilirubin 0.6,  alkaline phosphatase 62, AST 22, ALT 37, total protein 6.4, albumin 3.2,  and calcium 8.7.  Cardiac markers negative x3.   DISPOSITION:  The patient will be discharged home today in good  condition.   FOLLOWUP PLANS AND APPOINTMENTS:  The patient will undergo the first  day  of a 2-day adenosine Myoview today, March 06, 2008 at 1:00 p.m. in our  office.  She will follow up with Dr. Antoine Trujillo on March 14, 2008 at 2:30  p.m.  She is asked to follow up with Dr. Nedra Trujillo as previously scheduled and  she also continue to follow with Dr. Nedra Trujillo for her INRs.   DISCHARGE MEDICATIONS:  1. Aspirin 81 mg daily.  2. Coumadin as previously prescribed.  3. Cymbalta 40 mg b.i.d.  4. Nexium 40 mg daily.  5. Neurontin 900 mg t.i.d.  6. Multivitamin daily.  7. Atenolol 100 mg daily.  8. Celexa as previously prescribed.  9. Nitroglycerin 0.4 mg sublingual p.r.n. chest pain.   OUTSTANDING LABORATORY WORKUP AND STUDIES:  Lupus anticoagulant and  anticardiolipin IgG, IgM, and IgA are all pending.   DURATION OF DISCHARGE ENCOUNTER:  60 minutes including physician time.      [name], ANP      Diana Carrow, MD  Electronically Signed    CB/MEDQ  D:   03/06/2008  T:  03/06/2008  Job:  [id number]   cc:   [name]

## 2010-05-18 NOTE — H&P (Signed)
[name], [name]                ACCOUNT NO.:  [id number]   MEDICAL RECORD NO.:  [id number]          PATIENT TYPE:  EMS   LOCATION:  MAJO                         FACILITY:  [facility]   PHYSICIAN:  Lonia Blood, M.D.DATE OF BIRTH:  05/25/1955   DATE OF ADMISSION:  01/23/2008  DATE OF DISCHARGE:                              HISTORY & PHYSICAL   PRIMARY CARE PHYSICIAN:  Unassigned/ Dr. Nedra Hai in [place]   CHIEF COMPLAINT:  Bilaterally pulmonary emboli diagnosed with outpatient  CT scan.   HISTORY OF PRESENT ILLNESS:  Ms. [name] is a very pleasant 55 year  old female who lives in the [place] area with hr husband.  She suffers  with fibromyalgia and therefore frequently has pain which she often  times simply ignores.  She has been having severe upper thoracic back  pain off and on for many months now.  In an attempt to work this up, her  outpatient physician ordered a CT scan as an outpatient today through  the [facility] system.  The patient had her CT scan and was called to  return back to [facility] after a bout of pulmonary  emboli were noted incidentally on the CT scan.  The patient denies any  significant unilateral lower extremity swelling.  She denies any recent  distant trips.  She does report she had a brother who had trouble with  clotting but could not be more specific as to his history.  She does  not smoke and she is not on birth control pills.   REVIEW OF SYSTEMS:  The patient reports migratory chronic lower  extremity and pelvic type pain.  She also reports chronic migratory  upper and lower back pain.  There has been no hemoptysis.  There has  been no sudden weight loss.  There has been no severe shortness of  breath but the patient is dyspneic on exertion  although she blames this  on her obesity.  Comprehensive review of systems is otherwise  unremarkable.   PAST MEDICAL HISTORY:  1. History of nephrolithiasis.  2. Status post carpal tunnel  release.  3. Migraine headaches- degenerative joint disease.  4. Status post bilateral tubal ligations.  5. Fibromyalgia.  6. Left-sided Bell's palsy.   MEDICATIONS:  (dosage unknown).  1. Atenolol.  2. Cymbalta.  3. Nexium.  4. Neurontin.  5. Multivitamin.   ALLERGIES:  No known drug allergies.   FAMILY HISTORY:  The patient does report she has a brother who had  trouble with blood clots.   SOCIAL HISTORY:  The patient is married.  She lives in the [place]  area.  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  She does not smoke.  She occasional partakes of alcohol.   LABORATORY DATA:  White count is normal.  Hemoglobin is low at 11 with  MCV of 105.  Platelets are clumped but appear to be normal per  estimation manually.  BMET is unremarkable.  Coags are unremarkable.   CT scan of chest reveals bilateral pulmonary emboli.  CT scan of abdomen  and pelvis reveal fatty infiltrate in the liver but otherwise  they are  unremarkable.   PHYSICAL EXAMINATION:  Temperature 97.0, blood pressure 153/89, heart  rate 84, respiratory rate 20, O2 saturation is 97% on room air.  GENERAL:  Well developed, well nourished obese female in no acute  respiratory distress.  LUNGS:  Clear to auscultation bilaterally without wheezing or rhonchi.  CARDIOVASCULAR:  Regular rate and rhythm without murmur, rubs or gallop.  S1 and S2 normal.  ABDOMEN:  Obese, soft.  Bowel sounds are present.  No organomegaly, no  rebound or ascites.  EXTREMITIES:  Trace bilateral lower extremity edema without cyanosis or  clubbing.  No palpable cords are appreciated.  NEUROLOGIC:  Nonfocal neurologic exam.   IMPRESSION AND PLAN:  1. Bilateral pulmonary emboli.  Ms. [name] is suffering with      newly diagnosed bilateral pulmonary emboli.  Clinical history      suggests these are likely chronic on acute and have likely occurred      over an extended period of time and I suspect multiple  small      emboli.  The patient has had no specific episodes of acute      pleuritic type pain.  The patient has already been dosed with      Lovenox in the ER.  We will nonetheless go ahead and check      homocysteine, protein C and S levels, factor V Leiden, AT      antithrombin III levels, prothrombin gene mutation.  It is      understood that Lovenox can effect protein C and S and AT III      levels but if these levels are found to be normal in the face of      ongoing Lovenox, then this does effectively rule out a deficiency.      Phospholipid antibody is not felt to be indicated as the patient's      PTT is not prolonged.  Otherwise  the patient's primary risk factor      would be her obesity.  This has been discussed with her.      Regardless of the outcome of our anticoagulation studies, the      patient will require a minimum of 6 months of full anticoagulation.      She may be a good candidate for Lovenox at home but we will require      at least 24 to 48 hours to complete our inpatient evaluation for      deep venous thrombosis .  The patient does have chronic pelvic pain      and thigh pain and these symptoms may in fact be the result of      chronic lower extremity deep venous thrombosis .  We will check      venous Dopplers.  If the patient has an excessive clot burden, we      will consider IVC filter placement.  2. Obesity - the possible connection between the patient's size and      her pulmonary emboli has been discussed with her and appropriate      weight loss regimen has been suggested.  3. Macrocytosis - the patient has a mild anemia with macrocytosis.      Will check B12 and folic acid levels and evaluate further as      indicated.      Lonia Blood, M.D.  Electronically Signed     JTM/MEDQ  D:  01/23/2008  T:  01/23/2008  Job:  [id number]   cc:   Simone Curia

## 2010-05-18 NOTE — Discharge Summary (Signed)
[name], [name]                ACCOUNT NO.:  [id number]   MEDICAL RECORD NO.:  [id number]          PATIENT TYPE:  INP   LOCATION:  4735                         FACILITY:  [facility]   PHYSICIAN:  Eduard Clos, MDDATE OF BIRTH:  March 20, 1955   DATE OF ADMISSION:  01/23/2008  DATE OF DISCHARGE:  01/27/2008                               DISCHARGE SUMMARY   COURSE IN THE HOSPITAL:  A 55 year old female who had an upper back  pain, had a CT scan of the chest as outpatient and was followed by  rheumatologist, was found to have bilateral PE, was admitted for further  management and evaluation.  The patient was started on Lovenox and  Coumadin,  has been therapeutic for past 2 days.  The last INR was found  to be 2.7.  The patient during her stay was largely asymptomatic and  studies done on her were negative for any factor V Leiden mutation.  Antithrombin III levels were normal.  Protein C also was normal.  A 2-D  echo did not show any significant findings.  At this time, the patient  is eager to go home and the patient will need 1 more dose of Lovenox.  The patient will be instructed how to give the dose at 8 p.m. on January 27, 2008.  The patient also stated that there was a nurse who is willing  to administer.  In any case, the patient will be discharged only after  instruction for Lovenox dose.  At the time of this dictation, the  patient was hemodynamically stable.   PROCEDURE DURING THE STAY:  1. A 2-D echo on January 25, 2008, shows overall left ventricular      systolic function was normal.  Left ventricular ejection fraction      was estimated to be between 55%-68% with inadequate evaluation of      regional wall motion abnormality.  Left ventricle wall thickness      was mildly increased consistent with diastolic dysfunction.  2. CT of the chest with contrast on January 23, 2008, showed bilateral      pulmonary embolism, borderline heart size, normal thoracic aorta,  minimum streaky right middle lobe atelectasis, scarring.  No      infiltrates.  The patient has a pulmonary abscess.  3. CT abdomen and pelvis with contrast on January 23, 2008, shows mild      diffuse fatty infiltration of the liver and benign hepatic angioma.      No acute abnormal findings, mass lesions or  __________ adenopathy,      benign left renal cyst.  No acute pelvic findings, mild      diverticulosis of the sigmoid colon with associated muscular      thickening.   FINAL DIAGNOSES:  1. Bilateral pulmonary embolism.  2. Hypertension.  3. History of fibromyalgia.   MEDICATIONS AT DISCHARGE:  1. Lovenox 125 mg subcutaneous to be given at 8 p.m. on January 27, 2008.  2. Coumadin 5 mg p.o. daily to be taken at  6 p.m.  Recheck PT/INR  on      January 28, 2008, with the patient's primary care physician, Dr.      Simone Curia on January 28, 2008, and further dose of Coumadin      accordingly.  3. Atenolol 50 mg p.o. daily.  4. Cymbalta 40 mg p.o. b.i.d.  5. Neurontin 900 mg p.o. t.i.d.  6. Nexium 40 mg p.o. daily.  7. Ultram.  8. Vitamin D.  9. Calcium as taken before.   PLAN:  The patient was advised to follow up with her primary care  physician on January 28, 2008.  Recheck PT/INR on January 28, 2008, and  further dose of Coumadin accordingly.  The patient to take one more dose  of Lovenox on January 27, 2008, that is tonight at 8 p.m.  The patient's  further dose of Coumadin to be adjusted according to the PT/INR checked  on January 28, 2008, with her primary care physician, Dr. Simone Curia.  The patient is already advised about Coumadin and diet, to be on cardiac-  healthy diet.  The patient was advised that she will need at least 3-6  months of Coumadin.  The patient was also advised about further  colonoscopy.      Eduard Clos, MD  Electronically Signed     ANK/MEDQ  D:  01/27/2008  T:  01/28/2008  Job:  [id number] [id number]

## 2010-05-18 NOTE — Letter (Signed)
December 01, 2008    Dr. Colon Branch  [facility]  [address], [address]  [place], [place] [address]   RE:  [name], [name]  MRN:  [id number]  /  DOB:  [birth year]   Dear Dr. Phoebe Perch:   I am writing to you regarding your patient, [name].  As you know  she is a 55 year old white female with a history of bilateral pulmonary  emboli, fibromyalgia, hypertension who is now in need of a lumbar 2-3  discectomy. Ms. Alcantar has chronic dyspnea on exertion and chest  discomfort that is very unlikely to be from a cardiac source.  She had a  stress test in March of this year which showed no evidence of ischemia  or prior myocardial infarction.  Her left ventricular systolic function  was also completely normal at that time.  An echocardiogram performed on  November 11 that shows that her left ventricular function remains  completely normal and there is no sign of other cardiac structural  abnormalities.  In reviewing her other records, it appears that she did  have bilateral pulmonary emboli diagnosed at the beginning of the year,  and the etiology for these emboli was not able to be determined.  Her  primary care physician has since taken her off Coumadin.  From a cardiac  standpoint, I believe that she is low risk for the diskectomy.  She does  have some increased postoperative risk for DVT and pulmonary emboli as  she has had a thrombotic event in the past.  The risk, however, is not  likely to be high enough to prevent what sounds like a needed surgery.  Please contact my office if I can be of any further assistance.    Sincerely,      Brayton El, MD  Electronically Signed    SGA/MedQ  DD: 12/01/2008  DT: 12/01/2008  Job #: [id number]

## 2010-05-21 NOTE — Op Note (Signed)
[name], [name]                ACCOUNT NO.:  [id number]   MEDICAL RECORD NO.:  [id number]          PATIENT TYPE:  AMB   LOCATION:  NESC                         FACILITY:  [facility]   PHYSICIAN:  Ivor Costa. Farrel Gobble, M.D. DATE OF BIRTH:  09/24/1955   DATE OF PROCEDURE:  07/02/2004  DATE OF DISCHARGE:                                 OPERATIVE REPORT   PREOPERATIVE DIAGNOSES:  1.  Dysfunctional bleeding.  2.  Undesired fertility.  3.  Dysmenorrhea.   POSTOPERATIVE DIAGNOSES:  1.  Dysfunctional bleeding.  2.  Undesired fertility.  3.  Dysmenorrhea.   PROCEDURES:  1.  Laparoscopic bilateral tubal ligation.  2.  Hysteroscopic HTA ablation.  3.  Uterine curettage.   SURGEON:  Ivor Costa. Farrel Gobble, M.D.   ANESTHESIA:  General.   ESTIMATED BLOOD LOSS:  Minimal.   FINDINGS:  There was a bulbous uterus.  Normal tubes and ovaries.  No  adhesive disease was appreciated.  No endometriosis.  The cavity was normal  contours.  Smooth and normal ostia.   DESCRIPTION OF PROCEDURE:  The patient was taken to the operating room.  General anesthesia was induced.  Placed in the dorsolithotomy position.  Prepped and draped in usual sterile fashion.  A bivalved speculum was placed  in the vagina.  The cervix was visualized and a uterine manipulator was then  placed.  Gloves were changed and attention was turned to the abdomen where  an infraumbilical incision was made with a scalpel through which the Veress  needle was placed.  Although free flow of fluid was noted, the opening  pressure was 13, felt perhaps to be related to patient habitus.  This  obstructed our ability for flow.  The Veress needle was therefore removed  and the OptiView trocar was then inserted through the infraumbilical port  with careful attention towards placement.  Placement into the pelvis was  assured as CO2 was used during the placement.  The opening pressure was 13.  A pneumoperitoneum was then created for better  visualization.  The patient  was placed in the Trendelenburg position.  A #5 port was placed  suprapubically under direct visualization.  Using a blunt probe, the bowel  was swept away from the pelvis.  The tubes and ovaries were inspected and  noted to be unremarkable.  There was no endometriosis appreciated in the  sidewalls, uterosacral ligaments or cul-de-sac.  The uterus was noted to be  bulbous.  Using the 5 mm lower port, each tube was grasped, elevated and  cauterized in a series of three with electrocautery.  The pneumoperitoneum  was then released and attention was then turned towards the vaginal  procedure.  The uterine manipulator was removed.  The bivalve speculum was  replaced.  The cervix was grasped on the anterior lip and the sound was  placed.  The canal was noted to be curvilinear and was straightened better  with the inferior lip.  The cervix was then dilated to 9 [place] and we  attempted to place the hysteroscope at this point.  However, we were not  able to  pass the hysteroscope into the cavity.  Then going up by one, we  continued to attempt to place the hysteroscope into the cavity, but were  unsuccessful.  Ultimately the single-tooth tenaculum was removed and placed  laterally.  The cervix at this point had been dilated to 25 [place] and the  hysteroscope was able to be advanced through the cervix at this point with  ease.  The cavity was only remarkable for some fundal trauma from the  dilators.  We did have some difficulty distending the cavity.  The ostia  were, however, visualized.  The cool flush was begun as the single-tooth  tenaculum was removed to the cervical verge.  The single-tooth tenaculums  were then placed tightly around the hysteroscope.  There was noted to be no  change in the fluid level.  Warm flush also showed no change.  The  temperature was brought up to 70 mL.  However, slowly with time we were  losing fluid, although nothing could be  appreciated from the vaginal source.  With a 10 mL of loss, we stopped the procedure.  We cooled down the uterus.  As we had left the instruments in abdominally, we elceted to reinspect the  pelvis.  A pneumoperitoneum was then created from above and a cool flush of  300 mL/min then began.  Inspection of the uterus failed to show any loss of  fluid through the uterine serosa.  There was no fluid in the pelvis.  The  tubes also appeared to not be distended.  There was no fluid in the cul-de-  sac.  The pneumoperitoneum was then released.  We again went back  hysteroscopically.  There seemed to be no change in position of the  hysteroscope.  Brought back up to temperature and we continued the ablation.  Although we could see the walls distending, we again lost 10 mL, which we  felt may have been related to uterine wall relaxation.  Although at this  point the patient had had six minutes of thermal ablation, I elected to  abort the procedure at this point.  We inspected the uterus.  We seemed to  have good blanching of the uterus.  The ostia were much more visualized at  this point, which was very different than at the beginning of the case.  The  hysteroscope was slowly removed through the cervix after cooling.  The  junction between treated and nontreated tissue was obvious.  There was a  small amount of bleeding from the single-tooth tenaculum which was treated  with a figure-of-eight of 3-0 Vicryl and some gentle pressure.  Attention  was then turned back to the abdomen where the ports had been removed.  The  infraumbilical port was closed with 0 Vicryl.  The skin was closed with 3-0  plain.  The lower port was just reapproximated with tincture of benzoin with  Steri-Strips.  Marcaine 0.25% 10 mL was injected on the abdominal portion  and 20 mL were injected circumferentially on the cervical portion.  The  patient tolerated the procedure well.  The sponge, needle and instrument counts were  correct x 2.  She was transferred to the PACU in stable  condition.       THL/MEDQ  D:  07/02/2004  T:  07/02/2004  Job:  [id number]

## 2010-05-21 NOTE — H&P (Signed)
[name], [name]                ACCOUNT NO.:  [id number]   MEDICAL RECORD NO.:  [id number]          PATIENT TYPE:  AMB   LOCATION:  NESC                         FACILITY:  [facility]   PHYSICIAN:  Ivor Costa. Farrel Gobble, M.D. DATE OF BIRTH:  1955/09/18   DATE OF ADMISSION:  DATE OF DISCHARGE:                                HISTORY & PHYSICAL   CHIEF COMPLAINT:  Dysfunctional bleeding, desire for permanent  sterilization, and dysmenorrhea.   HISTORY OF PRESENT ILLNESS:  The patient is a 55 year old who was placed on  birth control pills for contraception as well as treatment of dysmenorrhea.  The patient has had irregular bleeding on the birth control pills without  missing any pills.  We saw her in March of this year.  Her cycles had never  been regulated on the pill and she had been switched with multiple pills,  most recently was switched from Mircette to Ovcon-35 because she was having  very light flow and mostly spotting.  We did an ultrasound at that point  thinking perhaps she may have had hematometra.  Her lining was only 1.8.  Because of that, no endometrial biopsy was performed.  We increased her  estrogen to 35 mcg, despite which she did not get her period on the placebos  but would sometimes get it mid cycle.  She also reports symptoms of hot  flashes, for which we have checked Estradiol levels.  However, on the birth  control pill, she was suppressed to less than 10.  We discontinued the birth  control pills to see if her symptoms improved.  Her Estradiol level was in  the normal range.  However, she continued to have hot flashes.  The patient  is not interested in conception at this point because of her age and would  like to have a tubal ligation performed but because of the dysfunction  bleeding is interested in having an HTA ablation as well because of her  dysmenorrhea. Although she does not have dyspareunia, we will evaluate her  pelvis as well for endometriosis.  The  patient complains of hot flashes,  nervousness, and dry skin as well as night sweats.  She has had TSH and  prolactin which have been normal.  She has been evaluated from a cardiac  perspective as well, which has also been negative. EMB was benign.   PAST OB/GYN HISTORY:  As mentioned above.   PAST MEDICAL HISTORY:  1.  Migraines, for which the patient takes atenolol.  2.  Carpal tunnel.  3.  Degenerative joint disease.   PAST SURGICAL HISTORY:  1.  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  2.  She has had kidney stones.  3.  Some scar tissue in her colon.   FAMILY HISTORY:  Negative for gynecologic cancers.  Otherwise is  noncontributory.   SOCIAL HISTORY:  She is married.  She drinks alcohol on rare occasions.  She  does not smoke.  She drinks 1-2 caffeinated beverages a day.  She does not  do formal exercise.   ALLERGIES:  Negative.   PHYSICAL  EXAMINATION:  GENERAL:  She is well-appearing.  HEART:  Regular rate.  LUNGS:  Clear to auscultation.  ABDOMEN:  Obese, soft, nontender.  PELVIC:  She has normal external female genitalia.  BUS is negative.  Vagina  pink and moist.  Cervix without gross lesions.  On bimanual exam, the uterus  is mobile and nontender, as were the adnexa.  Rectovaginal exam was  deferred.   DIAGNOSTIC STUDIES:  On ultrasound, her uterus was 6.5 x 2.5 x 2.7.  The  ovaries were negative.  Again as mentioned, the lining was 1.8.  Because of  dysfunctional bleeding on her prep for surgery, the patient did have an  endometrial biopsy which is pending at the time of this dictation but was  rushed and should be back prior to surgery.       THL/MEDQ  D:  06/30/2004  T:  06/30/2004  Job:  [id number]

## 2010-05-21 NOTE — Discharge Summary (Signed)
NAMETROY, HARTZOG                ACCOUNT NO.:  [id number]   MEDICAL RECORD NO.:  [id number]          PATIENT TYPE:  INP   LOCATION:  2006                         FACILITY:  [facility]   PHYSICIAN:  Theodosia Paling, MD    DATE OF BIRTH:  Apr 04, 1955   DATE OF ADMISSION:  02/21/2008  DATE OF DISCHARGE:  02/23/2008                               DISCHARGE SUMMARY   For admitting history, please refer to the excellent admission note  dictated by Dr. Carlena Hurl, MD, dated February 22, 2008.   DISCHARGE DIAGNOSES:  1. Chest pain.  2. History of pulmonary embolism.  3. History of kidney stones.  4. History of degenerative joint disease.   DISCHARGE MEDICATIONS:  New medications added:  1. Moxifloxacin 400 mg p.o. daily for 5 days.  2. Aspirin enteric-coated 81 mg p.o. daily  3. Lovenox 1 mg/kg subcu q.12 h   Medications to be continued:  1. Atenolol 100 mg p.o. daily.  2. Nexium 40 mg p.o. daily.  3. Neurontin 900 mg p.o. daily.  4. Coumadin 2.5 mg p.o. daily; to take 5 mg on February 20 and      February 24, 2008 and then resuming home regimen.   HOSPITAL COURSE:  Following issues were addressed during the  hospitalization.  1. Chest pain.  The patient underwent a CT scan of the chest to make      sure she did not have a recurrent PE.  She was found to have new      multifocal areas of airspace disease in right upper and lower lobe      suspicious for pneumonia/infarction.  She was admitted to telemetry      which was negative.  Her cardiac enzymes were negative.  The plan      was that the patient will be discharge for an outpatient stress      test.  Most likely the cause of her illnesses pneumonia or maybe      the emboli have broken down and caused infarction of the lungs that      was causing her to have pleuritic pain.  The patient was      subtherapeutic on Coumadin.  Therefore, Lovenox was started.  The      patient was well aware how to use Lovenox and was  discharged home      on same.  2. Pneumonia/airspace disease.  The patient was started on      moxifloxacin and drug interaction between moxifloxacin and Coumadin      was explained to the patient.  The patient will complete 1-week      course of moxifloxacin.  3. Hyperlipidemia.  Diet and exercise was recommended  4. Nocturnal hypoxemia.  The patient was recommended to pursue      outpatient sleep study.  5. History of PE.  As the patient was subtherapeutic, Lovenox and      Coumadin were started.   DISPOSITION:  The patient is going home on above medications.  The  patient is advised to follow up with Dr. Ventura Sellers within 2-3 days'  time for follow-up of INR.  Also the patient is advised to follow up  with [facility] for outpatient stress test.   Total time spent in discharge of this patient 45 minutes.   CONSULTATION PERFORMED:  None.   IMAGING PERFORMED:  As mentioned in hospital course.      Theodosia Paling, MD  Electronically Signed     NP/MEDQ  D:  04/13/2008  T:  04/13/2008  Job:  [id number]   cc:   Ventura Sellers

## 2010-07-22 ENCOUNTER — Encounter: Payer: Self-pay | Admitting: Cardiovascular Disease

## 2010-09-22 DIAGNOSIS — I2699 Other pulmonary embolism without acute cor pulmonale: Secondary | ICD-10-CM | POA: Insufficient documentation

## 2010-09-22 DIAGNOSIS — M797 Fibromyalgia: Secondary | ICD-10-CM | POA: Insufficient documentation

## 2010-09-22 DIAGNOSIS — G510 Bell's palsy: Secondary | ICD-10-CM | POA: Insufficient documentation

## 2010-09-22 DIAGNOSIS — K635 Polyp of colon: Secondary | ICD-10-CM | POA: Insufficient documentation

## 2010-09-22 DIAGNOSIS — Z789 Other specified health status: Secondary | ICD-10-CM | POA: Insufficient documentation

## 2010-09-27 ENCOUNTER — Encounter: Payer: Self-pay | Admitting: Women's Health

## 2010-10-05 ENCOUNTER — Encounter: Payer: Self-pay | Admitting: Women's Health

## 2010-10-27 ENCOUNTER — Encounter: Payer: Self-pay | Admitting: Women's Health

## 2010-12-22 ENCOUNTER — Encounter: Payer: Self-pay | Admitting: Women's Health

## 2012-04-09 ENCOUNTER — Encounter: Payer: Self-pay | Admitting: Internal Medicine

## 2012-06-01 ENCOUNTER — Telehealth: Payer: Self-pay | Admitting: *Deleted

## 2012-06-01 NOTE — Telephone Encounter (Signed)
Pt. States she is no longer on coumadin

## 2012-06-11 ENCOUNTER — Ambulatory Visit (AMBULATORY_SURGERY_CENTER): Payer: Medicare Other | Admitting: *Deleted

## 2012-06-11 ENCOUNTER — Encounter: Payer: Self-pay | Admitting: Internal Medicine

## 2012-06-11 VITALS — Ht 63.0 in | Wt 238.4 lb

## 2012-06-11 DIAGNOSIS — Z1211 Encounter for screening for malignant neoplasm of colon: Secondary | ICD-10-CM

## 2012-06-11 DIAGNOSIS — Z86010 Personal history of colonic polyps: Secondary | ICD-10-CM

## 2012-06-11 MED ORDER — MOVIPREP 100 G PO SOLR: 1.0000 | Freq: Once | ORAL | Status: DC

## 2012-06-11 NOTE — Progress Notes (Signed)
No problems with past sedation. Takes a while to wake from sedation. ewm No egg or soy allergy. ewm No home 02 use, no cpap, no OSA. ewm Dr Marina Goodell did last colon 06-15-2007. ewm Pt states that when she first lays down she has a sensation that she needs to take a deep breath and thinks its sinus related. emw

## 2012-06-19 ENCOUNTER — Ambulatory Visit (AMBULATORY_SURGERY_CENTER): Payer: Medicare Other | Admitting: Internal Medicine

## 2012-06-19 ENCOUNTER — Encounter: Payer: Self-pay | Admitting: Internal Medicine

## 2012-06-19 VITALS — BP 143/62 | HR 73 | Temp 96.2°F | Resp 0 | Ht 63.0 in | Wt 238.0 lb

## 2012-06-19 DIAGNOSIS — Z860100 Personal history of colon polyps, unspecified: Secondary | ICD-10-CM

## 2012-06-19 DIAGNOSIS — Z1211 Encounter for screening for malignant neoplasm of colon: Secondary | ICD-10-CM

## 2012-06-19 MED ORDER — SODIUM CHLORIDE 0.9 % IV SOLN: 500.0000 mL | INTRAVENOUS | Status: DC

## 2012-06-19 NOTE — Op Note (Signed)
 [facility] [address] [place], [address]   COLONOSCOPY PROCEDURE REPORT  PATIENT: [name], [name]  MR#: [id number] BIRTHDATE: 03/18/1955 , 56  yrs. old GENDER: Female ENDOSCOPIST: Roxy Cedar, MD REFERRED [id number] Program Recall PROCEDURE DATE:  06/19/2012 PROCEDURE:   Colonoscopy, surveillance ASA CLASS:   Class II INDICATIONS:Patient's personal history of adenomatous colon polyps. Index 06-2007 w/ small TA MEDICATIONS: MAC sedation, administered by CRNA and propofol (Diprivan) 200mg  IV  DESCRIPTION OF PROCEDURE:   After the risks benefits and alternatives of the procedure were thoroughly explained, informed consent was obtained.  A digital rectal exam revealed no abnormalities of the rectum.   The LB [id number]  endoscope was introduced through the anus and advanced to the cecum, which was identified by both the appendix and ileocecal valve. No adverse events experienced.   The quality of the prep was good, using MoviPrep  The instrument was then slowly withdrawn as the colon was fully examined.      COLON FINDINGS: A normal appearing cecum, ileocecal valve, and appendiceal orifice were identified.  The ascending, hepatic flexure, transverse, splenic flexure, descending, sigmoid colon and rectum appeared unremarkable.  No polyps or cancers were seen. Retroflexed views revealed internal hemorrhoids. The time to cecum=2 minutes 19 seconds.  Withdrawal time=8 minutes 16 seconds. The scope was withdrawn and the procedure completed. COMPLICATIONS: There were no complications.  ENDOSCOPIC IMPRESSION: 1. Normal colon  RECOMMENDATIONS: 1. Continue current colorectal surveillance recommendations  with a repeat colonoscopy in 10 years.   eSigned:  Roxy Cedar, MD 06/19/2012 11:10 AM   cc: The Patient    ; Donnel Saxon, MD

## 2012-06-19 NOTE — Patient Instructions (Signed)
YOU HAD AN ENDOSCOPIC PROCEDURE TODAY AT THE [facility]: Refer to the procedure report that was given to you for any specific questions about what was found during the examination.  If the procedure report does not answer your questions, please call your gastroenterologist to clarify.  If you requested that your care partner not be given the details of your procedure findings, then the procedure report has been included in a sealed envelope for you to review at your convenience later.  YOU SHOULD EXPECT: Some feelings of bloating in the abdomen. Passage of more gas than usual.  Walking can help get rid of the air that was put into your GI tract during the procedure and reduce the bloating. If you had a lower endoscopy (such as a colonoscopy or flexible sigmoidoscopy) you may notice spotting of blood in your stool or on the toilet paper. If you underwent a bowel prep for your procedure, then you may not have a normal bowel movement for a few days.  DIET: Your first meal following the procedure should be a light meal and then it is ok to progress to your normal diet.  A half-sandwich or bowl of soup is an example of a good first meal.  Heavy or fried foods are harder to digest and may make you feel nauseous or bloated.  Likewise meals heavy in dairy and vegetables can cause extra gas to form and this can also increase the bloating.  Drink plenty of fluids but you should avoid alcoholic beverages for 24 hours.  ACTIVITY: Your care partner should take you home directly after the procedure.  You should plan to take it easy, moving slowly for the rest of the day.  You can resume normal activity the day after the procedure however you should NOT DRIVE or use heavy machinery for 24 hours (because of the sedation medicines used during the test).    SYMPTOMS TO REPORT IMMEDIATELY: A gastroenterologist can be reached at any hour.  During normal business hours, 8:30 AM to 5:00 PM Monday through Friday,  call [phone].  After hours and on weekends, please call the GI answering service at [phone] who will take a message and have the physician on call contact you.   Following lower endoscopy (colonoscopy or flexible sigmoidoscopy):  Excessive amounts of blood in the stool  Significant tenderness or worsening of abdominal pains  Swelling of the abdomen that is new, acute  Fever of 100F or higher    FOLLOW UP: If any biopsies were taken you will be contacted by phone or by letter within the next 1-3 weeks.  Call your gastroenterologist if you have not heard about the biopsies in 3 weeks.  Our staff will call the home number listed on your records the next business day following your procedure to check on you and address any questions or concerns that you may have at that time regarding the information given to you following your procedure. This is a courtesy call and so if there is no answer at the home number and we have not heard from you through the emergency physician on call, we will assume that you have returned to your regular daily activities without incident.  SIGNATURES/CONFIDENTIALITY: You and/or your care partner have signed paperwork which will be entered into your electronic medical record.  These signatures attest to the fact that that the information above on your After Visit Summary has been reviewed and is understood.  Full responsibility of the confidentiality   of this discharge information lies with you and/or your care-partner.   REPEAT COLONOSCOPY IN 10 YEARS

## 2012-06-19 NOTE — Progress Notes (Signed)
Patient did not experience any of the following events: a burn prior to discharge; a fall within the facility; wrong site/side/patient/procedure/implant event; or a hospital transfer or hospital admission upon discharge from the facility. (G8907) Patient did not have preoperative order for IV antibiotic SSI prophylaxis. (G8918)  

## 2012-06-20 ENCOUNTER — Telehealth: Payer: Self-pay

## 2012-06-20 NOTE — Telephone Encounter (Signed)
  Follow up Call-  Call back number 06/19/2012  Post procedure Call Back phone  # [phone]  Permission to leave phone message Yes     Patient questions:  Do you have a fever, pain , or abdominal swelling? no Pain Score  0 *  Have you tolerated food without any problems? yes  Have you been able to return to your normal activities? yes  Do you have any questions about your discharge instructions: Diet   no Medications  no Follow up visit  no  Do you have questions or concerns about your Care? no  Actions: * If pain score is 4 or above: No action needed, pain <4.

## 2012-10-30 ENCOUNTER — Other Ambulatory Visit: Payer: Self-pay | Admitting: Pathology

## 2014-01-03 DIAGNOSIS — Z9289 Personal history of other medical treatment: Secondary | ICD-10-CM

## 2014-01-03 HISTORY — DX: Personal history of other medical treatment: Z92.89

## 2014-03-04 DIAGNOSIS — I639 Cerebral infarction, unspecified: Secondary | ICD-10-CM

## 2014-03-04 HISTORY — DX: Cerebral infarction, unspecified: I63.9

## 2014-04-25 ENCOUNTER — Encounter: Payer: Self-pay | Admitting: Internal Medicine

## 2015-01-16 DIAGNOSIS — Z5181 Encounter for therapeutic drug level monitoring: Secondary | ICD-10-CM | POA: Diagnosis not present

## 2015-01-21 DIAGNOSIS — R000 Tachycardia, unspecified: Secondary | ICD-10-CM | POA: Diagnosis not present

## 2015-01-21 DIAGNOSIS — I951 Orthostatic hypotension: Secondary | ICD-10-CM | POA: Diagnosis not present

## 2015-01-21 DIAGNOSIS — I10 Essential (primary) hypertension: Secondary | ICD-10-CM | POA: Diagnosis not present

## 2015-02-17 DIAGNOSIS — Z5181 Encounter for therapeutic drug level monitoring: Secondary | ICD-10-CM | POA: Diagnosis not present

## 2015-02-27 DIAGNOSIS — D649 Anemia, unspecified: Secondary | ICD-10-CM | POA: Diagnosis not present

## 2015-02-27 DIAGNOSIS — I10 Essential (primary) hypertension: Secondary | ICD-10-CM | POA: Diagnosis not present

## 2015-02-27 DIAGNOSIS — M797 Fibromyalgia: Secondary | ICD-10-CM | POA: Diagnosis not present

## 2015-02-27 DIAGNOSIS — F331 Major depressive disorder, recurrent, moderate: Secondary | ICD-10-CM | POA: Diagnosis not present

## 2015-02-27 DIAGNOSIS — J208 Acute bronchitis due to other specified organisms: Secondary | ICD-10-CM | POA: Diagnosis not present

## 2015-02-27 DIAGNOSIS — G43009 Migraine without aura, not intractable, without status migrainosus: Secondary | ICD-10-CM | POA: Diagnosis not present

## 2015-03-25 DIAGNOSIS — Z5181 Encounter for therapeutic drug level monitoring: Secondary | ICD-10-CM | POA: Diagnosis not present

## 2015-03-26 DIAGNOSIS — M25511 Pain in right shoulder: Secondary | ICD-10-CM | POA: Diagnosis not present

## 2015-03-26 DIAGNOSIS — R7301 Impaired fasting glucose: Secondary | ICD-10-CM | POA: Diagnosis not present

## 2015-03-26 DIAGNOSIS — M545 Low back pain: Secondary | ICD-10-CM | POA: Diagnosis not present

## 2015-03-26 DIAGNOSIS — R5383 Other fatigue: Secondary | ICD-10-CM | POA: Diagnosis not present

## 2015-03-26 DIAGNOSIS — R310 Gross hematuria: Secondary | ICD-10-CM | POA: Diagnosis not present

## 2015-03-26 DIAGNOSIS — M25512 Pain in left shoulder: Secondary | ICD-10-CM | POA: Diagnosis not present

## 2015-03-26 DIAGNOSIS — E782 Mixed hyperlipidemia: Secondary | ICD-10-CM | POA: Diagnosis not present

## 2015-04-09 DIAGNOSIS — M170 Bilateral primary osteoarthritis of knee: Secondary | ICD-10-CM | POA: Diagnosis not present

## 2015-04-09 DIAGNOSIS — M1711 Unilateral primary osteoarthritis, right knee: Secondary | ICD-10-CM | POA: Diagnosis not present

## 2015-04-22 DIAGNOSIS — Z5181 Encounter for therapeutic drug level monitoring: Secondary | ICD-10-CM | POA: Diagnosis not present

## 2015-04-22 DIAGNOSIS — R5383 Other fatigue: Secondary | ICD-10-CM | POA: Diagnosis not present

## 2015-04-22 DIAGNOSIS — Z79899 Other long term (current) drug therapy: Secondary | ICD-10-CM | POA: Diagnosis not present

## 2015-04-22 DIAGNOSIS — D518 Other vitamin B12 deficiency anemias: Secondary | ICD-10-CM | POA: Diagnosis not present

## 2015-04-22 DIAGNOSIS — I479 Paroxysmal tachycardia, unspecified: Secondary | ICD-10-CM | POA: Diagnosis not present

## 2015-04-22 DIAGNOSIS — I10 Essential (primary) hypertension: Secondary | ICD-10-CM | POA: Diagnosis not present

## 2015-04-22 DIAGNOSIS — M170 Bilateral primary osteoarthritis of knee: Secondary | ICD-10-CM | POA: Diagnosis not present

## 2015-05-15 ENCOUNTER — Other Ambulatory Visit: Payer: Self-pay | Admitting: Orthopedic Surgery

## 2015-05-20 DIAGNOSIS — I10 Essential (primary) hypertension: Secondary | ICD-10-CM | POA: Diagnosis not present

## 2015-05-20 DIAGNOSIS — E782 Mixed hyperlipidemia: Secondary | ICD-10-CM | POA: Diagnosis not present

## 2015-05-20 DIAGNOSIS — G43009 Migraine without aura, not intractable, without status migrainosus: Secondary | ICD-10-CM | POA: Diagnosis not present

## 2015-05-20 DIAGNOSIS — Z5181 Encounter for therapeutic drug level monitoring: Secondary | ICD-10-CM | POA: Diagnosis not present

## 2015-05-20 DIAGNOSIS — M5137 Other intervertebral disc degeneration, lumbosacral region: Secondary | ICD-10-CM | POA: Diagnosis not present

## 2015-05-27 DIAGNOSIS — Z1231 Encounter for screening mammogram for malignant neoplasm of breast: Secondary | ICD-10-CM | POA: Diagnosis not present

## 2015-06-05 ENCOUNTER — Encounter (HOSPITAL_COMMUNITY): Payer: Self-pay

## 2015-06-05 ENCOUNTER — Encounter (HOSPITAL_COMMUNITY)
Admission: RE | Admit: 2015-06-05 | Discharge: 2015-06-05 | Disposition: A | Discharge status: Home / Self Care | Payer: PPO | Source: Ambulatory Visit | Attending: Orthopedic Surgery | Admitting: Orthopedic Surgery

## 2015-06-05 DIAGNOSIS — Z79899 Other long term (current) drug therapy: Secondary | ICD-10-CM | POA: Insufficient documentation

## 2015-06-05 DIAGNOSIS — Z01818 Encounter for other preprocedural examination: Secondary | ICD-10-CM | POA: Diagnosis not present

## 2015-06-05 DIAGNOSIS — Z7901 Long term (current) use of anticoagulants: Secondary | ICD-10-CM | POA: Insufficient documentation

## 2015-06-05 DIAGNOSIS — Z8673 Personal history of transient ischemic attack (TIA), and cerebral infarction without residual deficits: Secondary | ICD-10-CM | POA: Insufficient documentation

## 2015-06-05 DIAGNOSIS — I10 Essential (primary) hypertension: Secondary | ICD-10-CM | POA: Insufficient documentation

## 2015-06-05 DIAGNOSIS — Z86711 Personal history of pulmonary embolism: Secondary | ICD-10-CM | POA: Diagnosis not present

## 2015-06-05 DIAGNOSIS — M1711 Unilateral primary osteoarthritis, right knee: Secondary | ICD-10-CM | POA: Insufficient documentation

## 2015-06-05 DIAGNOSIS — Z01812 Encounter for preprocedural laboratory examination: Secondary | ICD-10-CM | POA: Insufficient documentation

## 2015-06-05 HISTORY — DX: Presence of spectacles and contact lenses: Z97.3

## 2015-06-05 HISTORY — DX: Anxiety disorder, unspecified: F41.9

## 2015-06-05 HISTORY — DX: Anemia, unspecified: D64.9

## 2015-06-05 HISTORY — DX: Personal history of other medical treatment: Z92.89

## 2015-06-05 HISTORY — DX: Essential (primary) hypertension: I10

## 2015-06-05 HISTORY — DX: Gastro-esophageal reflux disease without esophagitis: K21.9

## 2015-06-05 HISTORY — DX: Cerebral infarction, unspecified: I63.9

## 2015-06-05 HISTORY — DX: Neoplasm of unspecified behavior of endocrine glands and other parts of nervous system: D49.7

## 2015-06-05 LAB — SURGICAL PCR SCREEN
MRSA, PCR: NEGATIVE
Staphylococcus aureus: NEGATIVE

## 2015-06-05 NOTE — Pre-Procedure Instructions (Signed)
[name]  06/05/2015      [facility] - [place], [place] [address] [place] [address] Phone: [phone] Fax: [phone]    Your procedure is scheduled on Monday, June 22, 2015  Report to [facility] Admitting at 8:00 A.M.  Call this number if you have problems the morning of surgery:  [phone]   Remember: Follow doctors instructions regarding Xarelto  Do not eat food or drink liquids after midnight Sunday, June 21, 2015  Take these medicines the morning of surgery with A SIP OF WATER : allopurinol (ZYLOPRIM), ARIPiprazole (ABILIFY), buPROPion (WELLBUTRIN XL), esomeprazole (NEXIUM), gabapentin (NEURONTIN), Milnacipran HCl (SAVELLA), sertraline (ZOLOFT), if needed:SUMAtriptan (IMITREX) for migraine, Hydrocodone for pain, cetirizine (ZYRTEC) for allergies, Refresh eye drops for dry eyes  Stop taking Aspirin, vitamins, fish oil and herbal medications. Do not take any NSAIDs ie: Ibuprofen, Advil, Naproxen, BC and Goody Powder or any medication containing Aspirin; stop Monday June 15, 2015  Do not wear jewelry, make-up or nail polish.  Do not wear lotions, powders, or perfumes.  You may not wear deodorant.  Do not shave 48 hours prior to surgery.    Do not bring valuables to the hospital.  [facility] is not responsible for any belongings or valuables.  Contacts, dentures or bridgework may not be worn into surgery.  Leave your suitcase in the car.  After surgery it may be brought to your room.  For patients admitted to the hospital, discharge time will be determined by your treatment team.  Patients discharged the day of surgery will not be allowed to drive home.   Name and phone number of your driver:   Special instructions:  [place] - Preparing for Surgery  Before surgery, you can play an important role.  Because skin is not sterile, your skin needs to be as free of germs as possible.  You can reduce the  number of germs on you skin by washing with CHG (chlorahexidine gluconate) soap before surgery.  CHG is an antiseptic cleaner which kills germs and bonds with the skin to continue killing germs even after washing.  Please DO NOT use if you have an allergy to CHG or antibacterial soaps.  If your skin becomes reddened/irritated stop using the CHG and inform your nurse when you arrive at Short Stay.  Do not shave (including legs and underarms) for at least 48 hours prior to the first CHG shower.  You may shave your face.  Please follow these instructions carefully:   1.  Shower with CHG Soap the night before surgery and the morning of Surgery.  2.  If you choose to wash your hair, wash your hair first as usual with your normal shampoo.  3.  After you shampoo, rinse your hair and body thoroughly to remove the Shampoo.  4.  Use CHG as you would any other liquid soap.  You can apply chg directly  to the skin and wash gently with scrungie or a clean washcloth.  5.  Apply the CHG Soap to your body ONLY FROM THE NECK DOWN.  Do not use on open wounds or open sores.  Avoid contact with your eyes, ears, mouth and genitals (private parts).  Wash genitals (private parts) with your normal soap.  6.  Wash thoroughly, paying special attention to the area where your surgery will be performed.  7.  Thoroughly rinse your body with warm water from the neck down.  8.  DO NOT shower/wash with your normal soap after using and rinsing off the CHG Soap.  9.  Pat yourself dry with a clean towel.            10.  Wear clean pajamas.            11.  Place clean sheets on your bed the night of your first shower and do not sleep with pets.  Day of Surgery  Do not apply any lotions/deodorants the morning of surgery.  Please wear clean clothes to the hospital/surgery center.  Please read over the following fact sheets that you were given. Pain Booklet, Coughing and Deep Breathing, Total Joint Packet, MRSA Information and  Surgical Site Infection Prevention

## 2015-06-05 NOTE — Progress Notes (Signed)
Pt denies SOB, chest pain, and being under the care of a cardiologist. Pt stated that she had an EKG, chest x ray and stress test and echo; records requested and received. Pt chart forwarded to anesthesia to review cardiac history (  Echo). LVM with Lattie Haw, nursing staff, at surgeon's office, to clarify pre-op Xarelto instructions; awaiting f/u with Lattie Haw.

## 2015-06-08 NOTE — Progress Notes (Signed)
Lattie Haw, nursing staff, at MD's office, called and stated that pt should stop Xarelto 3 days prior to procedure. Pt made aware that last dose should be Thursday, June 18, 2015. Pt verbalized understanding.

## 2015-06-08 NOTE — Progress Notes (Addendum)
Anesthesia Chart Review:  Pt is a 60 year old female scheduled for R total knee arthroplasty on 06/22/2015 with Dr. Ronnie Derby.   PCP is Dr. Celedonio Miyamoto.   PMH includes:  HTN, PE, stroke (03/2014), anemia, thyroid tumor. Never smoker. BMI 39.5  Medications include: abilify, nexium, lasix, lisinopril, simvastatin, xarelto. I spoke with Ramond Dial in Dr. Tenna Delaine office; Dr. Jannette Fogo has given ok to stop xarelto 3 days before surgery. I notified pt and LM at Dr. Ruel Favors office.   Preoperative labs will be obtained 06/12/15.   CT chest 07/14/14 [facility]): No evidence of significant PE. No evidence active pulmonary disease.    EKG 07/14/14: sinus tachycardia (107 bpm). Minimal voltage criteria for LVH, may be normal variant.   Nuclear stress test 07/15/14: No evidence of ischemia or scar. Normal wall motion and contractility. EF 64%  Echo 03/14/14 [facility]): 1. Technically difficult study with suboptimal views 2. Mild concentric LVH 3. Overall LV systolic function normal, EF 55-60% 4. Mild MR 5. Mild TR  If no changes, I anticipate pt can proceed with surgery as scheduled.   Willeen Cass, FNP-BC [facility] Short Stay Surgical Center/Anesthesiology Phone: [phone] 06/09/2015 3:45 PM  Addendum:  Preoperative labs 06/12/15 reviewed.   - PT 19.1, PTT 38. Will recheck DOS.  - H/H 9.7/31.2. This is consistent with results in our system from 6 years ago. Pt has hx anemia. Will get T&S DOS.  - Notified Lisa in Dr. Ruel Favors office of abnormal labs.   Willeen Cass, FNP-BC [facility] Short Stay Surgical Center/Anesthesiology Phone: [phone] 06/15/2015 2:00 PM

## 2015-06-12 ENCOUNTER — Encounter (HOSPITAL_COMMUNITY)
Admission: RE | Admit: 2015-06-12 | Discharge: 2015-06-12 | Disposition: A | Discharge status: Home / Self Care | Payer: PPO | Source: Ambulatory Visit | Attending: Orthopedic Surgery | Admitting: Orthopedic Surgery

## 2015-06-12 DIAGNOSIS — M1711 Unilateral primary osteoarthritis, right knee: Secondary | ICD-10-CM | POA: Insufficient documentation

## 2015-06-12 DIAGNOSIS — Z01812 Encounter for preprocedural laboratory examination: Secondary | ICD-10-CM | POA: Insufficient documentation

## 2015-06-12 LAB — CBC WITH DIFFERENTIAL/PLATELET
Basophils Absolute: 0.1 10*3/uL (ref 0.0–0.1)
Basophils Relative: 2 %
Eosinophils Absolute: 0.1 10*3/uL (ref 0.0–0.7)
Eosinophils Relative: 2 %
HCT: 31.2 % — ABNORMAL LOW (ref 36.0–46.0)
Hemoglobin: 9.7 g/dL — ABNORMAL LOW (ref 12.0–15.0)
Lymphocytes Relative: 32 %
Lymphs Abs: 1.7 10*3/uL (ref 0.7–4.0)
MCH: 33.7 pg (ref 26.0–34.0)
MCHC: 31.1 g/dL (ref 30.0–36.0)
MCV: 108.3 fL — ABNORMAL HIGH (ref 78.0–100.0)
Monocytes Absolute: 0.4 10*3/uL (ref 0.1–1.0)
Monocytes Relative: 9 %
Neutro Abs: 2.9 10*3/uL (ref 1.7–7.7)
Neutrophils Relative %: 55 %
Platelets: 517 10*3/uL — ABNORMAL HIGH (ref 150–400)
RBC: 2.88 MIL/uL — ABNORMAL LOW (ref 3.87–5.11)
RDW: 20.2 % — ABNORMAL HIGH (ref 11.5–15.5)
WBC: 5.2 10*3/uL (ref 4.0–10.5)

## 2015-06-12 LAB — COMPREHENSIVE METABOLIC PANEL
ALT: 20 U/L (ref 14–54)
AST: 24 U/L (ref 15–41)
Albumin: 4.5 g/dL (ref 3.5–5.0)
Alkaline Phosphatase: 69 U/L (ref 38–126)
Anion gap: 10 (ref 5–15)
BUN: 16 mg/dL (ref 6–20)
CO2: 28 mmol/L (ref 22–32)
Calcium: 9.9 mg/dL (ref 8.9–10.3)
Chloride: 99 mmol/L — ABNORMAL LOW (ref 101–111)
Creatinine, Ser: 1.49 mg/dL — ABNORMAL HIGH (ref 0.44–1.00)
GFR calc Af Amer: 44 mL/min — ABNORMAL LOW (ref >60)
GFR calc non Af Amer: 38 mL/min — ABNORMAL LOW (ref >60)
Glucose, Bld: 97 mg/dL (ref 65–99)
Potassium: 3.9 mmol/L (ref 3.5–5.1)
Sodium: 137 mmol/L (ref 135–145)
Total Bilirubin: 0.5 mg/dL (ref 0.3–1.2)
Total Protein: 8.1 g/dL (ref 6.5–8.1)

## 2015-06-12 LAB — URINALYSIS, ROUTINE W REFLEX MICROSCOPIC
Glucose, UA: NEGATIVE mg/dL
Hgb urine dipstick: NEGATIVE
Ketones, ur: 15 mg/dL — AB
Nitrite: NEGATIVE
Protein, ur: 30 mg/dL — AB
Specific Gravity, Urine: 1.027 (ref 1.005–1.030)
pH: 5 (ref 5.0–8.0)

## 2015-06-12 LAB — URINE MICROSCOPIC-ADD ON: Bacteria, UA: NONE SEEN

## 2015-06-12 LAB — PROTIME-INR
INR: 1.6 — ABNORMAL HIGH (ref 0.00–1.49)
Prothrombin Time: 19.1 seconds — ABNORMAL HIGH (ref 11.6–15.2)

## 2015-06-12 LAB — APTT: aPTT: 38 seconds — ABNORMAL HIGH (ref 24–37)

## 2015-06-13 LAB — URINE CULTURE

## 2015-06-19 DIAGNOSIS — R7301 Impaired fasting glucose: Secondary | ICD-10-CM | POA: Diagnosis not present

## 2015-06-19 DIAGNOSIS — E782 Mixed hyperlipidemia: Secondary | ICD-10-CM | POA: Diagnosis not present

## 2015-06-19 DIAGNOSIS — M170 Bilateral primary osteoarthritis of knee: Secondary | ICD-10-CM | POA: Diagnosis not present

## 2015-06-19 DIAGNOSIS — R5383 Other fatigue: Secondary | ICD-10-CM | POA: Diagnosis not present

## 2015-06-19 MED ORDER — TRANEXAMIC ACID 1000 MG/10ML IV SOLN
1000.0000 mg | INTRAVENOUS | Status: DC
  Filled 2015-06-19: qty 10

## 2015-06-21 MED ORDER — BUPIVACAINE LIPOSOME 1.3 % IJ SUSP
20.0000 mL | INTRAMUSCULAR | Status: AC
  Administered 2015-06-22: 20 mL
  Filled 2015-06-21 (×2): qty 20

## 2015-06-21 MED ORDER — ACETAMINOPHEN 500 MG PO TABS
1000.0000 mg | ORAL_TABLET | Freq: Once | ORAL | Status: AC
  Administered 2015-06-22: 1000 mg via ORAL
  Filled 2015-06-21: qty 2

## 2015-06-21 MED ORDER — CEFAZOLIN SODIUM-DEXTROSE 2-4 GM/100ML-% IV SOLN
2.0000 g | INTRAVENOUS | Status: AC
  Administered 2015-06-22: 2 g via INTRAVENOUS
  Filled 2015-06-21: qty 100

## 2015-06-21 MED ORDER — SODIUM CHLORIDE 0.9 % IV SOLN: INTRAVENOUS | Status: DC

## 2015-06-22 ENCOUNTER — Inpatient Hospital Stay (HOSPITAL_COMMUNITY)
Admission: RE | Admit: 2015-06-22 | Discharge: 2015-06-24 | DRG: 470 | Disposition: A | Discharge status: Home Health | Payer: PPO | Source: Ambulatory Visit | Attending: Orthopedic Surgery | Admitting: Orthopedic Surgery

## 2015-06-22 ENCOUNTER — Encounter (HOSPITAL_COMMUNITY)
Admission: RE | Disposition: A | Discharge status: Home Health | Payer: Self-pay | Source: Ambulatory Visit | Attending: Orthopedic Surgery

## 2015-06-22 ENCOUNTER — Inpatient Hospital Stay (HOSPITAL_COMMUNITY): Payer: PPO | Admitting: Emergency Medicine

## 2015-06-22 ENCOUNTER — Encounter (HOSPITAL_COMMUNITY): Payer: Self-pay | Admitting: *Deleted

## 2015-06-22 DIAGNOSIS — D62 Acute posthemorrhagic anemia: Secondary | ICD-10-CM | POA: Diagnosis not present

## 2015-06-22 DIAGNOSIS — M179 Osteoarthritis of knee, unspecified: Secondary | ICD-10-CM | POA: Diagnosis not present

## 2015-06-22 DIAGNOSIS — K219 Gastro-esophageal reflux disease without esophagitis: Secondary | ICD-10-CM | POA: Diagnosis present

## 2015-06-22 DIAGNOSIS — E7800 Pure hypercholesterolemia, unspecified: Secondary | ICD-10-CM | POA: Diagnosis present

## 2015-06-22 DIAGNOSIS — I10 Essential (primary) hypertension: Secondary | ICD-10-CM | POA: Diagnosis not present

## 2015-06-22 DIAGNOSIS — M797 Fibromyalgia: Secondary | ICD-10-CM | POA: Diagnosis present

## 2015-06-22 DIAGNOSIS — F419 Anxiety disorder, unspecified: Secondary | ICD-10-CM | POA: Diagnosis present

## 2015-06-22 DIAGNOSIS — Z6839 Body mass index (BMI) 39.0-39.9, adult: Secondary | ICD-10-CM | POA: Diagnosis not present

## 2015-06-22 DIAGNOSIS — G8918 Other acute postprocedural pain: Secondary | ICD-10-CM | POA: Diagnosis not present

## 2015-06-22 DIAGNOSIS — M1711 Unilateral primary osteoarthritis, right knee: Secondary | ICD-10-CM | POA: Diagnosis not present

## 2015-06-22 DIAGNOSIS — Z86711 Personal history of pulmonary embolism: Secondary | ICD-10-CM

## 2015-06-22 DIAGNOSIS — F329 Major depressive disorder, single episode, unspecified: Secondary | ICD-10-CM | POA: Diagnosis present

## 2015-06-22 DIAGNOSIS — Z8673 Personal history of transient ischemic attack (TIA), and cerebral infarction without residual deficits: Secondary | ICD-10-CM

## 2015-06-22 DIAGNOSIS — M25561 Pain in right knee: Secondary | ICD-10-CM | POA: Diagnosis not present

## 2015-06-22 HISTORY — DX: Unspecified osteoarthritis, unspecified site: M19.90

## 2015-06-22 HISTORY — DX: Other seasonal allergic rhinitis: J30.2

## 2015-06-22 HISTORY — DX: Low back pain: M54.5

## 2015-06-22 HISTORY — PX: TOTAL KNEE ARTHROPLASTY: SHX125

## 2015-06-22 HISTORY — DX: Pure hypercholesterolemia, unspecified: E78.00

## 2015-06-22 HISTORY — DX: Depression, unspecified: F32.A

## 2015-06-22 HISTORY — DX: Major depressive disorder, single episode, unspecified: F32.9

## 2015-06-22 HISTORY — DX: Low back pain, unspecified: M54.50

## 2015-06-22 HISTORY — DX: Other chronic pain: G89.29

## 2015-06-22 LAB — BASIC METABOLIC PANEL
Anion gap: 7 (ref 5–15)
BUN: 13 mg/dL (ref 6–20)
CO2: 26 mmol/L (ref 22–32)
Calcium: 9.4 mg/dL (ref 8.9–10.3)
Chloride: 105 mmol/L (ref 101–111)
Creatinine, Ser: 1.27 mg/dL — ABNORMAL HIGH (ref 0.44–1.00)
GFR calc Af Amer: 53 mL/min — ABNORMAL LOW (ref >60)
GFR calc non Af Amer: 46 mL/min — ABNORMAL LOW (ref >60)
Glucose, Bld: 101 mg/dL — ABNORMAL HIGH (ref 65–99)
Potassium: 4.1 mmol/L (ref 3.5–5.1)
Sodium: 138 mmol/L (ref 135–145)

## 2015-06-22 LAB — ABO/RH: ABO/RH(D): A POS

## 2015-06-22 LAB — PROTIME-INR
INR: 1.11 (ref 0.00–1.49)
Prothrombin Time: 14.5 seconds (ref 11.6–15.2)

## 2015-06-22 LAB — APTT: aPTT: 32 seconds (ref 24–37)

## 2015-06-22 SURGERY — ARTHROPLASTY, KNEE, TOTAL
Anesthesia: Spinal | Laterality: Right

## 2015-06-22 MED ORDER — HYDROMORPHONE HCL 1 MG/ML IJ SOLN
0.2500 mg | INTRAMUSCULAR | Status: DC | PRN
  Administered 2015-06-22 (×2): 0.5 mg via INTRAVENOUS

## 2015-06-22 MED ORDER — MIDAZOLAM HCL 2 MG/2ML IJ SOLN
INTRAMUSCULAR | Status: AC
  Filled 2015-06-22: qty 2

## 2015-06-22 MED ORDER — ONDANSETRON HCL 4 MG/2ML IJ SOLN
INTRAMUSCULAR | Status: DC | PRN
  Administered 2015-06-22: 4 mg via INTRAVENOUS

## 2015-06-22 MED ORDER — CHLORHEXIDINE GLUCONATE 4 % EX LIQD: 60.0000 mL | Freq: Once | CUTANEOUS | Status: DC

## 2015-06-22 MED ORDER — HYDROMORPHONE HCL 1 MG/ML IJ SOLN
INTRAMUSCULAR | Status: AC
  Filled 2015-06-22: qty 1

## 2015-06-22 MED ORDER — OXYCODONE HCL 5 MG/5ML PO SOLN: 5.0000 mg | Freq: Once | ORAL | Status: AC | PRN

## 2015-06-22 MED ORDER — BUPIVACAINE-EPINEPHRINE (PF) 0.25% -1:200000 IJ SOLN
INTRAMUSCULAR | Status: DC | PRN
  Administered 2015-06-22: 30 mL via PERINEURAL

## 2015-06-22 MED ORDER — ACETAMINOPHEN 650 MG RE SUPP: 650.0000 mg | Freq: Four times a day (QID) | RECTAL | Status: DC | PRN

## 2015-06-22 MED ORDER — LACTATED RINGERS IV SOLN
INTRAVENOUS | Status: DC | PRN
  Administered 2015-06-22 (×2): via INTRAVENOUS

## 2015-06-22 MED ORDER — PHENOL 1.4 % MT LIQD: 1.0000 | OROMUCOSAL | Status: DC | PRN

## 2015-06-22 MED ORDER — MIDAZOLAM HCL 5 MG/5ML IJ SOLN
INTRAMUSCULAR | Status: DC | PRN
  Administered 2015-06-22: 2 mg via INTRAVENOUS

## 2015-06-22 MED ORDER — SUMATRIPTAN SUCCINATE 25 MG PO TABS
25.0000 mg | ORAL_TABLET | ORAL | Status: DC | PRN
  Filled 2015-06-22: qty 1

## 2015-06-22 MED ORDER — OXYCODONE HCL 5 MG PO TABS
ORAL_TABLET | ORAL | Status: AC
  Filled 2015-06-22: qty 3

## 2015-06-22 MED ORDER — MAGNESIUM CITRATE PO SOLN: 1.0000 | Freq: Once | ORAL | Status: DC | PRN

## 2015-06-22 MED ORDER — ACETAMINOPHEN 325 MG PO TABS: 650.0000 mg | ORAL_TABLET | Freq: Four times a day (QID) | ORAL | Status: DC | PRN

## 2015-06-22 MED ORDER — LIDOCAINE HCL (CARDIAC) 20 MG/ML IV SOLN
INTRAVENOUS | Status: DC | PRN
  Administered 2015-06-22: 100 mg via INTRAVENOUS

## 2015-06-22 MED ORDER — ONDANSETRON HCL 4 MG/2ML IJ SOLN: 4.0000 mg | Freq: Four times a day (QID) | INTRAMUSCULAR | Status: DC | PRN

## 2015-06-22 MED ORDER — ACETAMINOPHEN 160 MG/5ML PO SOLN
325.0000 mg | ORAL | Status: DC | PRN
  Filled 2015-06-22: qty 20.3

## 2015-06-22 MED ORDER — FENTANYL CITRATE (PF) 250 MCG/5ML IJ SOLN
INTRAMUSCULAR | Status: AC
  Filled 2015-06-22: qty 5

## 2015-06-22 MED ORDER — CELECOXIB 200 MG PO CAPS
200.0000 mg | ORAL_CAPSULE | Freq: Two times a day (BID) | ORAL | Status: DC
  Administered 2015-06-22 – 2015-06-24 (×4): 200 mg via ORAL
  Filled 2015-06-22 (×5): qty 1

## 2015-06-22 MED ORDER — DOCUSATE SODIUM 100 MG PO CAPS
100.0000 mg | ORAL_CAPSULE | ORAL | Status: DC
  Administered 2015-06-22 – 2015-06-24 (×3): 100 mg via ORAL
  Filled 2015-06-22 (×3): qty 1

## 2015-06-22 MED ORDER — SIMVASTATIN 20 MG PO TABS
20.0000 mg | ORAL_TABLET | Freq: Every day | ORAL | Status: DC
  Administered 2015-06-22 – 2015-06-23 (×2): 20 mg via ORAL
  Filled 2015-06-22 (×2): qty 1

## 2015-06-22 MED ORDER — BISACODYL 10 MG RE SUPP: 10.0000 mg | Freq: Every day | RECTAL | Status: DC | PRN

## 2015-06-22 MED ORDER — PANTOPRAZOLE SODIUM 40 MG PO TBEC
40.0000 mg | DELAYED_RELEASE_TABLET | Freq: Every day | ORAL | Status: DC
  Administered 2015-06-23 – 2015-06-24 (×2): 40 mg via ORAL
  Filled 2015-06-22 (×2): qty 1

## 2015-06-22 MED ORDER — LISINOPRIL 20 MG PO TABS
20.0000 mg | ORAL_TABLET | Freq: Every day | ORAL | Status: DC
  Administered 2015-06-22 – 2015-06-23 (×2): 20 mg via ORAL
  Filled 2015-06-22 (×3): qty 1

## 2015-06-22 MED ORDER — ARTIFICIAL TEARS OP OINT
TOPICAL_OINTMENT | OPHTHALMIC | Status: AC
  Filled 2015-06-22: qty 3.5

## 2015-06-22 MED ORDER — OXYCODONE HCL 5 MG PO TABS
5.0000 mg | ORAL_TABLET | Freq: Once | ORAL | Status: AC | PRN
  Administered 2015-06-22: 5 mg via ORAL

## 2015-06-22 MED ORDER — MILNACIPRAN HCL 50 MG PO TABS
25.0000 mg | ORAL_TABLET | Freq: Two times a day (BID) | ORAL | Status: DC
  Administered 2015-06-23 – 2015-06-24 (×4): 25 mg via ORAL
  Filled 2015-06-22 (×6): qty 0.5

## 2015-06-22 MED ORDER — SODIUM CHLORIDE 0.9 % IR SOLN
Status: DC | PRN
  Administered 2015-06-22: 3000 mL

## 2015-06-22 MED ORDER — MIDAZOLAM HCL 2 MG/2ML IJ SOLN
INTRAMUSCULAR | Status: AC
  Administered 2015-06-22: 2 mg via INTRAVENOUS
  Filled 2015-06-22: qty 2

## 2015-06-22 MED ORDER — LORATADINE 10 MG PO TABS: 10.0000 mg | ORAL_TABLET | Freq: Every day | ORAL | Status: DC

## 2015-06-22 MED ORDER — METHOCARBAMOL 500 MG PO TABS
500.0000 mg | ORAL_TABLET | Freq: Four times a day (QID) | ORAL | Status: DC | PRN
Start: 2015-06-22 — End: 2015-06-25
  Administered 2015-06-22 – 2015-06-24 (×5): 500 mg via ORAL
  Filled 2015-06-22 (×4): qty 1

## 2015-06-22 MED ORDER — ACETAMINOPHEN 325 MG PO TABS: 325.0000 mg | ORAL_TABLET | ORAL | Status: DC | PRN

## 2015-06-22 MED ORDER — LACTATED RINGERS IV SOLN
Freq: Once | INTRAVENOUS | Status: AC
  Administered 2015-06-22: 11:00:00 via INTRAVENOUS

## 2015-06-22 MED ORDER — MIDAZOLAM HCL 2 MG/2ML IJ SOLN
2.0000 mg | Freq: Once | INTRAMUSCULAR | Status: AC
  Administered 2015-06-22: 2 mg via INTRAVENOUS

## 2015-06-22 MED ORDER — RIVAROXABAN 20 MG PO TABS
20.0000 mg | ORAL_TABLET | Freq: Every day | ORAL | Status: DC
  Administered 2015-06-22 – 2015-06-24 (×3): 20 mg via ORAL
  Filled 2015-06-22 (×3): qty 1

## 2015-06-22 MED ORDER — FENTANYL CITRATE (PF) 100 MCG/2ML IJ SOLN
75.0000 ug | Freq: Once | INTRAMUSCULAR | Status: AC
  Administered 2015-06-22: 75 ug via INTRAVENOUS

## 2015-06-22 MED ORDER — OXYCODONE HCL ER 10 MG PO T12A
10.0000 mg | EXTENDED_RELEASE_TABLET | Freq: Two times a day (BID) | ORAL | Status: DC
  Administered 2015-06-22 – 2015-06-24 (×4): 10 mg via ORAL
  Filled 2015-06-22 (×4): qty 1

## 2015-06-22 MED ORDER — PROPOFOL 10 MG/ML IV BOLUS
INTRAVENOUS | Status: AC
  Filled 2015-06-22: qty 20

## 2015-06-22 MED ORDER — CEFAZOLIN SODIUM-DEXTROSE 2-4 GM/100ML-% IV SOLN
2.0000 g | Freq: Four times a day (QID) | INTRAVENOUS | Status: AC
  Administered 2015-06-22 (×2): 2 g via INTRAVENOUS
  Filled 2015-06-22 (×2): qty 100

## 2015-06-22 MED ORDER — ONDANSETRON HCL 4 MG PO TABS: 4.0000 mg | ORAL_TABLET | Freq: Four times a day (QID) | ORAL | Status: DC | PRN

## 2015-06-22 MED ORDER — SODIUM CHLORIDE 0.9 % IJ SOLN
INTRAMUSCULAR | Status: DC | PRN
  Administered 2015-06-22 (×2): 10 mL

## 2015-06-22 MED ORDER — DIPHENHYDRAMINE HCL 12.5 MG/5ML PO ELIX: 12.5000 mg | ORAL_SOLUTION | ORAL | Status: DC | PRN

## 2015-06-22 MED ORDER — ONDANSETRON HCL 4 MG/2ML IJ SOLN
INTRAMUSCULAR | Status: AC
  Filled 2015-06-22: qty 2

## 2015-06-22 MED ORDER — POLYVINYL ALCOHOL 1.4 % OP SOLN
1.0000 [drp] | OPHTHALMIC | Status: DC | PRN
  Filled 2015-06-22: qty 15

## 2015-06-22 MED ORDER — ALLOPURINOL 100 MG PO TABS
100.0000 mg | ORAL_TABLET | Freq: Every day | ORAL | Status: DC
  Administered 2015-06-23 – 2015-06-24 (×2): 100 mg via ORAL
  Filled 2015-06-22 (×2): qty 1

## 2015-06-22 MED ORDER — BUPROPION HCL ER (XL) 150 MG PO TB24
300.0000 mg | ORAL_TABLET | ORAL | Status: DC
  Administered 2015-06-23 – 2015-06-24 (×2): 300 mg via ORAL
  Filled 2015-06-22 (×2): qty 2

## 2015-06-22 MED ORDER — PROPOFOL 10 MG/ML IV BOLUS
INTRAVENOUS | Status: DC | PRN
  Administered 2015-06-22: 150 mg via INTRAVENOUS

## 2015-06-22 MED ORDER — FUROSEMIDE 20 MG PO TABS: 20.0000 mg | ORAL_TABLET | Freq: Every day | ORAL | Status: DC | PRN

## 2015-06-22 MED ORDER — POTASSIUM CHLORIDE IN NACL 20-0.9 MEQ/L-% IV SOLN
INTRAVENOUS | Status: DC
  Administered 2015-06-22: 100 mL/h via INTRAVENOUS
  Filled 2015-06-22: qty 1000

## 2015-06-22 MED ORDER — POLYETHYLENE GLYCOL 3350 17 G PO PACK: 17.0000 g | PACK | Freq: Every day | ORAL | Status: DC | PRN

## 2015-06-22 MED ORDER — METHOCARBAMOL 500 MG PO TABS
ORAL_TABLET | ORAL | Status: AC
  Filled 2015-06-22: qty 1

## 2015-06-22 MED ORDER — MENTHOL 3 MG MT LOZG: 1.0000 | LOZENGE | OROMUCOSAL | Status: DC | PRN

## 2015-06-22 MED ORDER — ZOLPIDEM TARTRATE 5 MG PO TABS: 5.0000 mg | ORAL_TABLET | Freq: Every evening | ORAL | Status: DC | PRN

## 2015-06-22 MED ORDER — OXYCODONE HCL 5 MG PO TABS
5.0000 mg | ORAL_TABLET | ORAL | Status: DC | PRN
  Administered 2015-06-22 – 2015-06-24 (×12): 10 mg via ORAL
  Filled 2015-06-22 (×11): qty 2

## 2015-06-22 MED ORDER — ALUM & MAG HYDROXIDE-SIMETH 200-200-20 MG/5ML PO SUSP: 30.0000 mL | ORAL | Status: DC | PRN

## 2015-06-22 MED ORDER — LACTATED RINGERS IV SOLN
Freq: Once | INTRAVENOUS | Status: AC
  Administered 2015-06-22: 09:00:00 via INTRAVENOUS

## 2015-06-22 MED ORDER — DEXAMETHASONE SODIUM PHOSPHATE 10 MG/ML IJ SOLN
10.0000 mg | Freq: Once | INTRAMUSCULAR | Status: AC
  Administered 2015-06-23: 10 mg via INTRAVENOUS
  Filled 2015-06-22: qty 1

## 2015-06-22 MED ORDER — METOCLOPRAMIDE HCL 5 MG PO TABS: 5.0000 mg | ORAL_TABLET | Freq: Three times a day (TID) | ORAL | Status: DC | PRN

## 2015-06-22 MED ORDER — METOCLOPRAMIDE HCL 5 MG/ML IJ SOLN: 5.0000 mg | Freq: Three times a day (TID) | INTRAMUSCULAR | Status: DC | PRN

## 2015-06-22 MED ORDER — FENTANYL CITRATE (PF) 100 MCG/2ML IJ SOLN
INTRAMUSCULAR | Status: AC
  Administered 2015-06-22: 75 ug via INTRAVENOUS
  Filled 2015-06-22: qty 2

## 2015-06-22 MED ORDER — ARIPIPRAZOLE 5 MG PO TABS
5.0000 mg | ORAL_TABLET | Freq: Every day | ORAL | Status: DC
  Administered 2015-06-23 – 2015-06-24 (×2): 5 mg via ORAL
  Filled 2015-06-22 (×2): qty 1

## 2015-06-22 MED ORDER — FENTANYL CITRATE (PF) 250 MCG/5ML IJ SOLN
INTRAMUSCULAR | Status: DC | PRN
  Administered 2015-06-22 (×4): 25 ug via INTRAVENOUS

## 2015-06-22 MED ORDER — POLYVINYL ALCOHOL-POVIDONE 1.4-0.6 % OP SOLN: 1.0000 [drp] | OPHTHALMIC | Status: DC | PRN

## 2015-06-22 MED ORDER — BUPIVACAINE-EPINEPHRINE (PF) 0.25% -1:200000 IJ SOLN
INTRAMUSCULAR | Status: AC
  Filled 2015-06-22: qty 30

## 2015-06-22 MED ORDER — DEXTROSE 5 % IV SOLN
10.0000 mg | INTRAVENOUS | Status: DC | PRN
  Administered 2015-06-22: 50 ug/min via INTRAVENOUS

## 2015-06-22 MED ORDER — ROPIVACAINE HCL 5 MG/ML IJ SOLN
INTRAMUSCULAR | Status: DC | PRN
  Administered 2015-06-22: 25 mL via PERINEURAL

## 2015-06-22 MED ORDER — PHENYLEPHRINE HCL 10 MG/ML IJ SOLN
INTRAMUSCULAR | Status: DC | PRN
  Administered 2015-06-22: 40 ug via INTRAVENOUS
  Administered 2015-06-22: 80 ug via INTRAVENOUS
  Administered 2015-06-22: 40 ug via INTRAVENOUS
  Administered 2015-06-22: 80 ug via INTRAVENOUS

## 2015-06-22 MED ORDER — SERTRALINE HCL 50 MG PO TABS
150.0000 mg | ORAL_TABLET | ORAL | Status: DC
  Administered 2015-06-23 – 2015-06-24 (×2): 150 mg via ORAL
  Filled 2015-06-22 (×2): qty 1

## 2015-06-22 MED ORDER — WHITE PETROLATUM GEL: 1.0000 "application " | Status: DC | PRN

## 2015-06-22 MED ORDER — GABAPENTIN 600 MG PO TABS
600.0000 mg | ORAL_TABLET | Freq: Three times a day (TID) | ORAL | Status: DC
  Administered 2015-06-22 – 2015-06-24 (×7): 600 mg via ORAL
  Filled 2015-06-22 (×8): qty 1

## 2015-06-22 MED ORDER — METHOCARBAMOL 1000 MG/10ML IJ SOLN
500.0000 mg | Freq: Four times a day (QID) | INTRAVENOUS | Status: DC | PRN
  Filled 2015-06-22: qty 5

## 2015-06-22 SURGICAL SUPPLY — 63 items
BANDAGE ACE 6X5 VEL STRL LF (GAUZE/BANDAGES/DRESSINGS) ×1 IMPLANT
BANDAGE ESMARK 6X9 LF (GAUZE/BANDAGES/DRESSINGS) ×1 IMPLANT
BLADE SAGITTAL 13X1.27X60 (BLADE) ×2 IMPLANT
BLADE SAW SGTL 83.5X18.5 (BLADE) ×2 IMPLANT
BLADE SURG 10 STRL SS (BLADE) ×2 IMPLANT
BNDG CMPR 9X6 STRL LF SNTH (GAUZE/BANDAGES/DRESSINGS) ×1
BNDG ESMARK 6X9 LF (GAUZE/BANDAGES/DRESSINGS) ×2
BOWL SMART MIX CTS (DISPOSABLE) ×2 IMPLANT
CAPT KNEE TOTAL 3 ×2 IMPLANT
CEMENT BONE SIMPLEX SPEEDSET (Cement) ×4 IMPLANT
CHLORAPREP W/TINT 26ML (MISCELLANEOUS) ×1 IMPLANT
CLSR STERI-STRIP ANTIMIC 1/2X4 (GAUZE/BANDAGES/DRESSINGS) ×1 IMPLANT
COVER SURGICAL LIGHT HANDLE (MISCELLANEOUS) ×2 IMPLANT
CUFF TOURNIQUET SINGLE 34IN LL (TOURNIQUET CUFF) ×2 IMPLANT
DRAPE EXTREMITY T 121X128X90 (DRAPE) ×2 IMPLANT
DRAPE INCISE IOBAN 66X45 STRL (DRAPES) ×2 IMPLANT
DRAPE PROXIMA HALF (DRAPES) IMPLANT
DRAPE U-SHAPE 47X51 STRL (DRAPES) ×2 IMPLANT
DRSG ADAPTIC 3X8 NADH LF (GAUZE/BANDAGES/DRESSINGS) ×2 IMPLANT
DRSG AQUACEL AG ADV 3.5X10 (GAUZE/BANDAGES/DRESSINGS) ×1 IMPLANT
DRSG PAD ABDOMINAL 8X10 ST (GAUZE/BANDAGES/DRESSINGS) ×2 IMPLANT
DURAPREP 26ML APPLICATOR (WOUND CARE) ×2 IMPLANT
ELECT REM PT RETURN 9FT ADLT (ELECTROSURGICAL) ×2
ELECTRODE REM PT RTRN 9FT ADLT (ELECTROSURGICAL) ×1 IMPLANT
GAUZE SPONGE 4X4 12PLY STRL (GAUZE/BANDAGES/DRESSINGS) ×2 IMPLANT
GLOVE BIOGEL M 7.0 STRL (GLOVE) ×1 IMPLANT
GLOVE BIOGEL PI IND STRL 7.5 (GLOVE) IMPLANT
GLOVE BIOGEL PI IND STRL 8.5 (GLOVE) ×5 IMPLANT
GLOVE BIOGEL PI INDICATOR 7.5 (GLOVE) ×1
GLOVE BIOGEL PI INDICATOR 8.5 (GLOVE) ×5
GLOVE SURG ORTHO 8.0 STRL STRW (GLOVE) ×12 IMPLANT
GOWN STRL REUS W/ TWL LRG LVL3 (GOWN DISPOSABLE) ×1 IMPLANT
GOWN STRL REUS W/ TWL XL LVL3 (GOWN DISPOSABLE) ×2 IMPLANT
GOWN STRL REUS W/TWL 2XL LVL3 (GOWN DISPOSABLE) ×2 IMPLANT
GOWN STRL REUS W/TWL LRG LVL3 (GOWN DISPOSABLE) ×4
GOWN STRL REUS W/TWL XL LVL3 (GOWN DISPOSABLE) ×4
HANDPIECE INTERPULSE COAX TIP (DISPOSABLE) ×2
HOOD PEEL AWAY FACE SHEILD DIS (HOOD) ×6 IMPLANT
KIT BASIN OR (CUSTOM PROCEDURE TRAY) ×2 IMPLANT
KIT ROOM TURNOVER OR (KITS) ×2 IMPLANT
KNEE CAPITATED TOTAL 3 IMPLANT
MANIFOLD NEPTUNE II (INSTRUMENTS) ×2 IMPLANT
NEEDLE 22X1 1/2 (OR ONLY) (NEEDLE) ×4 IMPLANT
NS IRRIG 1000ML POUR BTL (IV SOLUTION) ×2 IMPLANT
PACK TOTAL JOINT (CUSTOM PROCEDURE TRAY) ×2 IMPLANT
PACK UNIVERSAL I (CUSTOM PROCEDURE TRAY) ×2 IMPLANT
PAD ARMBOARD 7.5X6 YLW CONV (MISCELLANEOUS) ×4 IMPLANT
PADDING CAST COTTON 6X4 STRL (CAST SUPPLIES) ×2 IMPLANT
SET HNDPC FAN SPRY TIP SCT (DISPOSABLE) ×1 IMPLANT
STAPLER VISISTAT 35W (STAPLE) ×1 IMPLANT
SUCTION FRAZIER HANDLE 10FR (MISCELLANEOUS) ×1
SUCTION TUBE FRAZIER 10FR DISP (MISCELLANEOUS) ×1 IMPLANT
SUT BONE WAX W31G (SUTURE) ×2 IMPLANT
SUT MNCRL AB 3-0 PS2 18 (SUTURE) ×1 IMPLANT
SUT VIC AB 0 CTB1 27 (SUTURE) ×4 IMPLANT
SUT VIC AB 1 CT1 27 (SUTURE) ×4
SUT VIC AB 1 CT1 27XBRD ANBCTR (SUTURE) ×2 IMPLANT
SUT VIC AB 2-0 CT1 27 (SUTURE) ×4
SUT VIC AB 2-0 CT1 TAPERPNT 27 (SUTURE) ×2 IMPLANT
SYR 20CC LL (SYRINGE) ×4 IMPLANT
TOWEL OR 17X24 6PK STRL BLUE (TOWEL DISPOSABLE) ×2 IMPLANT
TOWEL OR 17X26 10 PK STRL BLUE (TOWEL DISPOSABLE) ×2 IMPLANT
WATER STERILE IRR 1000ML POUR (IV SOLUTION) ×3 IMPLANT

## 2015-06-22 NOTE — Anesthesia Procedure Notes (Addendum)
Anesthesia Regional Block:  Adductor canal block  Pre-Anesthetic Checklist: ,, timeout performed, Correct Patient, Correct Site, Correct Laterality, Correct Procedure, Correct Position, site marked, Risks and benefits discussed,  Surgical consent,  Pre-op evaluation,  At surgeon's request and post-op pain management  Laterality: Right  Prep: chloraprep       Needles:  Injection technique: Single-shot  Needle Type: Stimiplex     Needle Length: 9cm 9 cm Needle Gauge: 21 and 21 G    Additional Needles:  Procedures: ultrasound guided (picture in chart) Adductor canal block Narrative:  Injection made incrementally with aspirations every 5 mL.  Performed by: Personally  Anesthesiologist: JUDD, BENJAMIN  Additional Notes: Risks, benefits and alternative to block explained extensively.  Patient tolerated procedure well, without complications.   Procedure Name: LMA Insertion Date/Time: 06/22/2015 11:55 AM Performed by: Maryland Pink Pre-anesthesia Checklist: Patient identified, Emergency Drugs available, Suction available, Patient being monitored and Timeout performed Patient Re-evaluated:Patient Re-evaluated prior to inductionOxygen Delivery Method: Circle system utilized Preoxygenation: Pre-oxygenation with 100% oxygen Intubation Type: IV induction LMA: LMA inserted LMA Size: 4.0 Number of attempts: 1 Placement Confirmation: positive ETCO2 and breath sounds checked- equal and bilateral Tube secured with: Tape Dental Injury: Teeth and Oropharynx as per pre-operative assessment

## 2015-06-22 NOTE — Anesthesia Preprocedure Evaluation (Addendum)
Anesthesia Evaluation  Patient identified by MRN, date of birth, ID band Patient awake    Reviewed: Allergy & Precautions, NPO status , Patient's Chart, lab work & pertinent test results  History of Anesthesia Complications Negative for: history of anesthetic complications  Airway Mallampati: III  TM Distance: >3 FB Neck ROM: Full    Dental  (+) Poor Dentition, Dental Advisory Given   Pulmonary PE PE x 3, last while hospitalized in March 2016   Pulmonary exam normal breath sounds clear to auscultation       Cardiovascular hypertension, Pt. on medications (-) angina(-) Past MI and (-) CHF  Rhythm:Regular Rate:Normal     Neuro/Psych  Headaches, neg Seizures PSYCHIATRIC DISORDERS Anxiety Stroke 3/16, no residual symptoms, left CNVII injury  CVA, No Residual Symptoms    GI/Hepatic GERD  Medicated,  Endo/Other    Renal/GU      Musculoskeletal  (+) Fibromyalgia -  Abdominal   Peds  Hematology  (+) anemia ,   Anesthesia Other Findings   Reproductive/Obstetrics                            Anesthesia Physical Anesthesia Plan  ASA: III  Anesthesia Plan: General   Post-op Pain Management:    Induction: Intravenous  Airway Management Planned: LMA  Additional Equipment: None  Intra-op Plan:   Post-operative Plan:   Informed Consent: I have reviewed the patients History and Physical, chart, labs and discussed the procedure including the risks, benefits and alternatives for the proposed anesthesia with the patient or authorized representative who has indicated his/her understanding and acceptance.   Dental advisory given  Plan Discussed with: CRNA, Surgeon and Anesthesiologist  Anesthesia Plan Comments: (She denies wanting spinal anesthesia, will do adductor canal block and GA)       Anesthesia Quick Evaluation

## 2015-06-22 NOTE — H&P (Signed)
[name] MRN:  [id number] DOB/SEX:  [birth year]/female  CHIEF COMPLAINT:  Painful right Knee  HISTORY: Patient is a 60 y.o. female presented with a history of pain in the right knee. Onset of symptoms was gradual starting a few years ago with gradually worsening course since that time. Patient has been treated conservatively with over-the-counter NSAIDs and activity modification. Patient currently rates pain in the knee at 10 out of 10 with activity. There is pain at night.  PAST MEDICAL HISTORY: Patient Active Problem List   Diagnosis Date Noted  . Bell palsy   . Fibromyalgia   . Pulmonary embolism ([place])   . Colon polyps   . GERD 10/29/2008  . CHEST PAIN 10/29/2008  . PERSONAL HX COLONIC POLYPS 10/29/2008   Past Medical History  Diagnosis Date  . Nephrolithiasis   . Morbid obesity ([place])   . Migraine headache   . Bell palsy   . Fibromyalgia   . Pulmonary embolism ([place])   . Colon polyps   . Allergy     seasonal  . Wears glasses   . Stroke ([place])     03/2014  . History of blood transfusion   . Anxiety   . Tumor, thyroid     x 3  . Chronic back pain   . GERD (gastroesophageal reflux disease)   . Anemia   . Hypertension    Past Surgical History  Procedure Laterality Date  . Tubal ligation    . Carpal tunnel release    . Facial nerve tumor      BENIGN  . Breast surgery  1999    RIGHT BREAST/BENIGN  . Kidney stones  2000    HAD STINT  . Endometrial ablation    . Colonoscopy    . Polypectomy    . Ruptured disc surgery    . Diagnostic laparoscopy    . Parotidectomy    . Bladder surgery       MEDICATIONS:   No prescriptions prior to admission    ALLERGIES:   Allergies  Allergen Reactions  . Shellfish-Derived Products Swelling and Anaphylaxis  . Pineapple Swelling and Other (See Comments)    Stomach upset    REVIEW OF SYSTEMS:  A comprehensive review of systems was negative except for: Musculoskeletal: positive for arthralgias, bone pain and stiff  joints   FAMILY HISTORY:   Family History  Problem Relation Age of Onset  . Kidney disease    . Heart disease    . Alzheimer's disease    . Cancer Mother     THYROID  . Hypertension Father   . Colon cancer Neg Hx     SOCIAL HISTORY:   Social History  Substance Use Topics  . Smoking status: Never Smoker   . Smokeless tobacco: Never Used  . Alcohol Use: Yes     Comment: rare     EXAMINATION:  Vital signs in last 24 hours:    There were no vitals taken for this visit.  General Appearance:    Alert, cooperative, no distress, appears stated age  Head:    Normocephalic, without obvious abnormality, atraumatic  Eyes:    PERRL, conjunctiva/corneas clear, EOM's intact, fundi    benign, both eyes  Ears:    Normal TM's and external ear canals, both ears  Nose:   Nares normal, septum midline, mucosa normal, no drainage    or sinus tenderness  Throat:   Lips, mucosa, and tongue normal; teeth and gums normal  Neck:  Supple, symmetrical, trachea midline, no adenopathy;    thyroid:  no enlargement/tenderness/nodules; no carotid   bruit or JVD  Back:     Symmetric, no curvature, ROM normal, no CVA tenderness  Lungs:     Clear to auscultation bilaterally, respirations unlabored  Chest Wall:    No tenderness or deformity   Heart:    Regular rate and rhythm, S1 and S2 normal, no murmur, rub   or gallop  Breast Exam:    No tenderness, masses, or nipple abnormality  Abdomen:     Soft, non-tender, bowel sounds active all four quadrants,    no masses, no organomegaly  Genitalia:    Normal female without lesion, discharge or tenderness  Rectal:    Normal tone, no masses or tenderness;   guaiac negative stool  Extremities:   Extremities normal, atraumatic, no cyanosis or edema  Pulses:   2+ and symmetric all extremities  Skin:   Skin color, texture, turgor normal, no rashes or lesions  Lymph nodes:   Cervical, supraclavicular, and axillary nodes normal  Neurologic:   CNII-XII intact,  normal strength, sensation and reflexes    throughout    Musculoskeletal:  ROM 0-120, Ligaments intact,  Imaging Review Plain radiographs demonstrate severe degenerative joint disease of the right knee. The overall alignment is neutral. The bone quality appears to be excellent for age and reported activity level.  Assessment/Plan: Primary osteoarthritis, right knee   The patient history, physical examination and imaging studies are consistent with advanced degenerative joint disease of the right knee. The patient has failed conservative treatment.  The clearance notes were reviewed.  After discussion with the patient it was felt that Total Knee Replacement was indicated. The procedure,  risks, and benefits of total knee arthroplasty were presented and reviewed. The risks including but not limited to aseptic loosening, infection, blood clots, vascular injury, stiffness, patella tracking problems complications among others were discussed. The patient acknowledged the explanation, agreed to proceed with the plan.  Donia Ast 06/22/2015, 6:11 AM

## 2015-06-22 NOTE — Progress Notes (Signed)
Orthopedic Tech Progress Note Patient Details:  [name] Dec 26, 1955 [id number]  CPM Right Knee CPM Right Knee: On Right Knee Flexion (Degrees): 0 Right Knee Extension (Degrees): 0 Additional Comments: trapeze bar patient helper Viewed order from doctor's order list  Hildred Priest 06/22/2015, 2:17 PM

## 2015-06-22 NOTE — Transfer of Care (Signed)
Immediate Anesthesia Transfer of Care Note  Patient: [name]  Procedure(s) Performed: Procedure(s): TOTAL KNEE ARTHROPLASTY (Right)  Patient Location: PACU  Anesthesia Type:General and GA combined with regional for post-op pain  Level of Consciousness: awake and alert   Airway & Oxygen Therapy: Patient Spontanous Breathing and Patient connected to nasal cannula oxygen  Post-op Assessment: Report given to RN and Post -op Vital signs reviewed and stable  Post vital signs: Reviewed and stable  Last Vitals:  Filed Vitals:   06/22/15 1115 06/22/15 1120  BP: 115/56 121/59  Pulse: 83 83  Temp:    Resp: 15 16    Last Pain:  Filed Vitals:   06/22/15 1337  PainSc: 0-No pain      Patients Stated Pain Goal: 3 ([id number])  Complications: No apparent anesthesia complications

## 2015-06-22 NOTE — Addendum Note (Signed)
Addendum  created 06/22/15 1527 by Jillyn Hidden, MD   Modules edited: Anesthesia Attestations

## 2015-06-22 NOTE — Anesthesia Postprocedure Evaluation (Signed)
Anesthesia Post Note  Patient: [name]  Procedure(s) Performed: Procedure(s) (LRB): TOTAL KNEE ARTHROPLASTY (Right)  Patient location during evaluation: PACU Anesthesia Type: General and Regional Level of consciousness: awake and alert Pain management: pain level controlled Vital Signs Assessment: post-procedure vital signs reviewed and stable Respiratory status: spontaneous breathing, nonlabored ventilation, respiratory function stable and patient connected to nasal cannula oxygen Cardiovascular status: blood pressure returned to baseline and stable Postop Assessment: no signs of nausea or vomiting Anesthetic complications: no    Last Vitals:  Filed Vitals:   06/22/15 1445 06/22/15 1517  BP:  137/74  Pulse: 87 92  Temp: 36.1 C 36.9 C  Resp: 12 14    Last Pain:  Filed Vitals:   06/22/15 1519  PainSc: Asleep                 Zenaida Deed

## 2015-06-22 NOTE — Evaluation (Signed)
Physical Therapy Evaluation Patient Details Name: [name] MRN: [id number] DOB: 1955-08-11 Today's Date: 06/22/2015   History of Present Illness  Patient is a 60 y/o female with hx of Bells palsy, fibromyalgia, PE, CVA, anxiety and HTN presents s/p Rt TKA.   Clinical Impression  Patient presents with pain, dizziness and post surgical deficits RLE s/p Rt TKA. Tolerated taking a few steps to the chair with Min guard assist. Ambulation distance limited due to dizziness/lighteadedness and pallor. BP stable. 02 donned. Tech notified to take blood sugar. Pt will have support from spouse at home. Education re: exercises, precautions and positioning. Will follow acutely to maximize independence and mobility prior to return home.    Follow Up Recommendations Home health PT;Supervision - Intermittent    Equipment Recommendations  None recommended by PT    Recommendations for Other Services OT consult     Precautions / Restrictions Precautions Precautions: Knee Precaution Booklet Issued: No Precaution Comments: Reviewed no pillow under knee and precautions. Restrictions Weight Bearing Restrictions: Yes RLE Weight Bearing: Weight bearing as tolerated      Mobility  Bed Mobility Overal bed mobility: Needs Assistance Bed Mobility: Supine to Sit     Supine to sit: Min guard;HOB elevated     General bed mobility comments: Use of rails. No assist needed. + lightheadedness, dizziness. BP 142/81, pallor.  Transfers Overall transfer level: Needs assistance Equipment used: Rolling walker (2 wheeled) Transfers: Sit to/from Stand Sit to Stand: Min guard         General transfer comment: Min guard to boost from EOB with cues for hand placement/technique. + dizziness, pallor. Transferred to chair post ambulation.  Ambulation/Gait Ambulation/Gait assistance: Min guard Ambulation Distance (Feet): 8 Feet Assistive device: Rolling walker (2 wheeled) Gait Pattern/deviations: Step-to  pattern;Decreased stride length;Trunk flexed Gait velocity: decreased Gait velocity interpretation: Below normal speed for age/gender General Gait Details: Slow, unsteady gait with right knee instability noted. No buckling. Pt holding breath at times. + ligthheadedness. BP 157/67, pallor.   Stairs            Wheelchair Mobility    Modified Rankin (Stroke Patients Only)       Balance Overall balance assessment: Needs assistance Sitting-balance support: Feet supported;No upper extremity supported Sitting balance-Leahy Scale: Fair     Standing balance support: During functional activity Standing balance-Leahy Scale: Poor Standing balance comment: Reliant on BUEs for support.                              Pertinent Vitals/Pain Pain Assessment: Faces Faces Pain Scale: Hurts whole lot Pain Location: right knee Pain Descriptors / Indicators: Sore;Aching Pain Intervention(s): Monitored during session;Repositioned;Patient requesting pain meds-RN notified    Home Living Family/patient expects to be discharged to:: Private residence Living Arrangements: Spouse/significant other Available Help at Discharge: Family;Available 24 hours/day Type of Home: House Home Access: Stairs to enter Entrance Stairs-Rails: Right Entrance Stairs-Number of Steps: 3 Home Layout: Two level;Bed/bath upstairs Home Equipment: Walker - 2 wheels;Bedside commode      Prior Function Level of Independence: Independent with assistive device(s)         Comments: Uses SPC PTA.     Hand Dominance        Extremity/Trunk Assessment   Upper Extremity Assessment: Defer to OT evaluation           Lower Extremity Assessment: RLE deficits/detail RLE Deficits / Details: Limited AROM/strength post surgery. Able to perform partial  LAQ       Communication   Communication: No difficulties  Cognition Arousal/Alertness: Awake/alert Behavior During Therapy: WFL for tasks  assessed/performed Overall Cognitive Status: Within Functional Limits for tasks assessed                      General Comments General comments (skin integrity, edema, etc.): Spouse present during session.    Exercises Total Joint Exercises Ankle Circles/Pumps: Both;15 reps;Supine Quad Sets: Both;10 reps;Supine (3 sec hold) Gluteal Sets: Both;10 reps;Supine      Assessment/Plan    PT Assessment Patient needs continued PT services  PT Diagnosis Difficulty walking;Acute pain   PT Problem List Decreased strength;Pain;Decreased range of motion;Impaired sensation;Decreased activity tolerance;Decreased balance;Decreased mobility;Decreased knowledge of precautions  PT Treatment Interventions Balance training;Gait training;Stair training;Functional mobility training;Therapeutic activities;Therapeutic exercise;Patient/family education   PT Goals (Current goals can be found in the Care Plan section) Acute Rehab PT Goals Patient Stated Goal: to get out of here ASAP PT Goal Formulation: With patient/family Time For Goal Achievement: 07/06/15 Potential to Achieve Goals: Good    Frequency 7X/week   Barriers to discharge Inaccessible home environment 1 flight of steps to get to bedroom    Co-evaluation               End of Session Equipment Utilized During Treatment: Gait belt Activity Tolerance: Treatment limited secondary to medical complications (Comment) (dizziness, lightheadedness) Patient left: in chair;with call bell/phone within reach;with family/visitor present Nurse Communication: Mobility status;Other (comment) (zero degree knee)         Time: [id number] PT Time Calculation (min) (ACUTE ONLY): 30 min   Charges:   PT Evaluation $PT Eval Moderate Complexity: 1 Procedure PT Treatments $Therapeutic Activity: 8-22 mins   PT G Codes:        Garnie Borchardt A Charie Pinkus 06/22/2015, 4:44 PM Wray Kearns, [place], DPT [phone]

## 2015-06-23 ENCOUNTER — Encounter (HOSPITAL_COMMUNITY): Payer: Self-pay | Admitting: Orthopedic Surgery

## 2015-06-23 DIAGNOSIS — M1711 Unilateral primary osteoarthritis, right knee: Secondary | ICD-10-CM | POA: Diagnosis not present

## 2015-06-23 LAB — CBC
HCT: 22.4 % — ABNORMAL LOW (ref 36.0–46.0)
Hemoglobin: 7 g/dL — ABNORMAL LOW (ref 12.0–15.0)
MCH: 34.5 pg — ABNORMAL HIGH (ref 26.0–34.0)
MCHC: 31.3 g/dL (ref 30.0–36.0)
MCV: 110.3 fL — ABNORMAL HIGH (ref 78.0–100.0)
Platelets: 362 10*3/uL (ref 150–400)
RBC: 2.03 MIL/uL — ABNORMAL LOW (ref 3.87–5.11)
RDW: 20.8 % — ABNORMAL HIGH (ref 11.5–15.5)
WBC: 4.4 10*3/uL (ref 4.0–10.5)

## 2015-06-23 LAB — BASIC METABOLIC PANEL
Anion gap: 6 (ref 5–15)
BUN: 12 mg/dL (ref 6–20)
CO2: 28 mmol/L (ref 22–32)
Calcium: 8.6 mg/dL — ABNORMAL LOW (ref 8.9–10.3)
Chloride: 103 mmol/L (ref 101–111)
Creatinine, Ser: 1.25 mg/dL — ABNORMAL HIGH (ref 0.44–1.00)
GFR calc Af Amer: 54 mL/min — ABNORMAL LOW (ref >60)
GFR calc non Af Amer: 46 mL/min — ABNORMAL LOW (ref >60)
Glucose, Bld: 121 mg/dL — ABNORMAL HIGH (ref 65–99)
Potassium: 4.3 mmol/L (ref 3.5–5.1)
Sodium: 137 mmol/L (ref 135–145)

## 2015-06-23 MED ORDER — ONDANSETRON HCL 4 MG PO TABS: 4.0000 mg | ORAL_TABLET | Freq: Four times a day (QID) | ORAL | Status: DC | PRN

## 2015-06-23 MED ORDER — OXYCODONE HCL 5 MG PO TABS: 5.0000 mg | ORAL_TABLET | ORAL | Status: DC | PRN

## 2015-06-23 MED ORDER — METHOCARBAMOL 500 MG PO TABS: 500.0000 mg | ORAL_TABLET | Freq: Four times a day (QID) | ORAL | Status: DC | PRN

## 2015-06-23 NOTE — Progress Notes (Signed)
Physical Therapy Treatment Patient Details Name: [name] MRN: [id number] DOB: March 18, 1955 Today's Date: 06/23/2015    History of Present Illness Patient is a 60 y/o female with hx of Bells palsy, fibromyalgia, PE, CVA, anxiety and HTN presents s/p Rt TKA.     PT Comments    Pt performed increased mobility and stair training.  Pt advanced therapeutic exercises and reviewed complete HEP.  Pt slated for d/c tomorrow.  Current plan remains appropriate.    Follow Up Recommendations  Home health PT;Supervision - Intermittent     Equipment Recommendations  None recommended by PT    Recommendations for Other Services       Precautions / Restrictions Precautions Precautions: Knee Precaution Booklet Issued: Yes (comment) Precaution Comments: Reviewed no pillow under knee Restrictions Weight Bearing Restrictions: Yes RLE Weight Bearing: Weight bearing as tolerated    Mobility  Bed Mobility Overal bed mobility: Modified Independent Bed Mobility: Supine to Sit     Supine to sit: Min guard;HOB elevated     General bed mobility comments: pt up in recliner upon entering room  Transfers Overall transfer level: Needs assistance Equipment used: Rolling walker (2 wheeled) Transfers: Sit to/from Stand Sit to Stand: Supervision         General transfer comment: Good technique and hand placement.  No assist required.  Supervision for safety with carryover of previous education.    Ambulation/Gait Ambulation/Gait assistance: Min guard Ambulation Distance (Feet): 80 Feet (x2 trials.  ) Assistive device: Rolling walker (2 wheeled) Gait Pattern/deviations: Step-through pattern;Decreased stride length;Trunk flexed Gait velocity: decreased   General Gait Details: Pt progressing with step through sequencing.  No knee instability noted.  Remains to require close proximity of RW to improve safety.     Stairs Stairs: Yes   Stair Management: One rail  Left;Backwards;Forwards Number of Stairs: 12 General stair comments: Cues for sequencing and hand placement on railing.  Pt performed negotiation forward to ascend and backwards to descend.    Wheelchair Mobility    Modified Rankin (Stroke Patients Only)       Balance Overall balance assessment: Needs assistance Sitting-balance support: No upper extremity supported;Feet supported Sitting balance-Leahy Scale: Good     Standing balance support: During functional activity Standing balance-Leahy Scale: Fair                      Cognition Arousal/Alertness: Awake/alert Behavior During Therapy: WFL for tasks assessed/performed Overall Cognitive Status: Within Functional Limits for tasks assessed                      Exercises Total Joint Exercises Ankle Circles/Pumps: Both;15 reps;Supine Quad Sets: 10 reps;Supine;Right Towel Squeeze: AROM;Both;10 reps;Supine Short Arc Quad: AROM;Right;10 reps;Supine Heel Slides: AAROM;Right;10 reps;Supine Hip ABduction/ADduction: AAROM;Right;10 reps;Supine Straight Leg Raises: AAROM;Right;10 reps;Supine Long Arc Quad: AROM;Right;10 reps;Seated Knee Flexion: AROM;AAROM;Seated;Right (1x5 AROM with 10 sec hold and 1x5 reps AAROM with 10 sec hold.  ) Goniometric ROM: 4-74 degrees R knee.      General Comments        Pertinent Vitals/Pain Pain Assessment: 0-10 Pain Score: 6  Pain Location: R Knee with movement.   Pain Descriptors / Indicators: Aching;Sore Pain Intervention(s): Monitored during session;Repositioned    Home Living   Living Arrangements: Spouse/significant other Available Help at Discharge: Family;Available 24 hours/day Type of Home: House Home Access: Stairs to enter Entrance Stairs-Rails: Right Home Layout: Two level;Bed/bath upstairs Home Equipment: Walker - 2 wheels;Bedside commode;Shower seat  Prior Function Level of Independence: Independent with assistive device(s)      Comments: uses cane    PT Goals (current goals can now be found in the care plan section) Acute Rehab PT Goals Patient Stated Goal: go home today Potential to Achieve Goals: Good Progress towards PT goals: Progressing toward goals    Frequency  7X/week    PT Plan Current plan remains appropriate    Co-evaluation             End of Session Equipment Utilized During Treatment: Gait belt Activity Tolerance: Patient tolerated treatment well Patient left: in chair;with call bell/phone within reach;with family/visitor present     Time: [id number] PT Time Calculation (min) (ACUTE ONLY): 38 min  Charges:  $Gait Training: 8-22 mins $Therapeutic Exercise: 23-37 mins                    G Codes:      [name] 07/23/15, 2:53 PM  [name], PTA pager [phone]

## 2015-06-23 NOTE — Discharge Instructions (Signed)

## 2015-06-23 NOTE — Progress Notes (Signed)
SPORTS MEDICINE AND JOINT REPLACEMENT  Lara Mulch, MD   [facility] PA-C [place], [place],   [id number]                             [phone]   PROGRESS NOTE  Subjective:  negative for Chest Pain  negative for Shortness of Breath  negative for Nausea/Vomiting   negative for Calf Pain  negative for Bowel Movement   Tolerating Diet: yes         Patient reports pain as 5 on 0-10 scale.    Objective: Vital signs in last 24 hours:   Patient Vitals for the past 24 hrs:  BP Temp Temp src Pulse Resp SpO2  06/23/15 1300 (!) 136/57 mmHg 99.1 F (37.3 C) Oral 98 16 96 %  06/23/15 0500 (!) 127/59 mmHg - - 95 14 100 %  06/23/15 0100 (!) 109/52 mmHg - - 91 13 98 %  06/22/15 2019 (!) 94/55 mmHg 99.2 F (37.3 C) Oral 98 14 99 %  06/22/15 1708 137/74 mmHg - - - - -  06/22/15 1517 137/74 mmHg 98.5 F (36.9 C) Oral 92 14 100 %  06/22/15 1445 - 97 F (36.1 C) - 87 12 100 %  06/22/15 1442 138/77 mmHg - - 91 15 100 %  06/22/15 1430 - - - 92 14 100 %  06/22/15 1415 - - - 91 12 100 %  06/22/15 1413 (!) 152/75 mmHg - - 90 14 100 %  06/22/15 1400 - - - 94 (!) 21 95 %  06/22/15 1357 (!) 156/82 mmHg - - 93 (!) 21 91 %  06/22/15 1351 - - - 94 10 96 %    @flow {1959:LAST@   Intake/Output from previous day:   06/19 0701 - 06/20 0700 In: 1870 [I.V.:1870] Out: 30    Intake/Output this shift:   06/20 0701 - 06/20 1900 In: 240 [P.O.:240] Out: -    Intake/Output      06/19 0701 - 06/20 0700 06/20 0701 - 06/21 0700   P.O.  240   I.V. 1870    Total Intake 1870 240   Urine 0    Blood 30    Total Output 30     Net +1840 +240        Urine Occurrence 2 x 1 x      LABORATORY DATA:  Recent Labs  06/23/15 0306  WBC 4.4  HGB 7.0*  HCT 22.4*  PLT 362    Recent Labs  06/22/15 0954 06/23/15 0306  NA 138 137  K 4.1 4.3  CL 105 103  CO2 26 28  BUN 13 12  CREATININE 1.27* 1.25*  GLUCOSE 101* 121*  CALCIUM 9.4 8.6*   Lab Results  Component Value Date   INR 1.11 06/22/2015   INR 1.60* 06/12/2015   INR 0.99 01/28/2009    Examination:  General appearance: alert, cooperative and no distress Extremities: extremities normal, atraumatic, no cyanosis or edema  Wound Exam: clean, dry, intact   Drainage:  None: wound tissue dry  Motor Exam: Quadriceps and Hamstrings Intact  Sensory Exam: Superficial Peroneal, Deep Peroneal and Tibial normal   Assessment:    1 Day Post-Op  Procedure(s) (LRB): TOTAL KNEE ARTHROPLASTY (Right)  ADDITIONAL DIAGNOSIS:  Active Problems:   Primary localized osteoarthritis of right knee  Acute Blood Loss Anemia   Plan: Physical Therapy as ordered Weight Bearing as Tolerated (WBAT)  DVT  Prophylaxis:  Xarelto  DISCHARGE PLAN: Home  DISCHARGE NEEDS: HHPT   Patient is progressing, will wait for clearance from PT as well as making sure her blood count comes back up         Donia Ast 06/23/2015, 1:47 PM

## 2015-06-23 NOTE — Care Management Note (Signed)
Case Management Note  Patient Details  Name: [name] MRN: [id number] Date of Birth: 1955-08-06  Subjective/Objective:   60 yr old female s/p right total knee arthroplasty.  Action/Plan:   Case manager spoke with patient and her husband concerning home health and DME needs. Choice was offered for [facility], patient was preoperatively setup with [facility], no changes. Patient has rolling walker, 3in1 and CPM at home. Will have family support at discharge.    Expected Discharge Date:    06/24/15              Expected Discharge Plan:   home with Home Health  In-House Referral:     Discharge planning Services     Post Acute Care Choice:    Choice offered to:     DME Arranged:   NA DME Agency:     HH Arranged:   PT  HH Agency:   [facility]  Status of Service:     If discussed at [employer], dates discussed:    Additional Comments:  Ninfa Meeker, RN 06/23/2015, 12:15 PM

## 2015-06-23 NOTE — Care Management Important Message (Signed)
Important Message  Patient Details  Name: [name] MRN: [id number] Date of Birth: 03-Aug-1955   Medicare Important Message Given:  Yes    Shiah Berhow, Leroy Sea 06/23/2015, 8:22 AM

## 2015-06-23 NOTE — Progress Notes (Signed)
Physical Therapy Treatment Patient Details Name: [name] MRN: [id number] DOB: May 16, 1955 Today's Date: 06/23/2015    History of Present Illness Patient is a 60 y/o female with hx of Bells palsy, fibromyalgia, PE, CVA, anxiety and HTN presents s/p Rt TKA.     PT Comments    Pt performed increased gait and mobility.  Pt remains to c/o dizziness and presents with pallor.  SPO2 on RA 94% during tx.  Informed RN and RN reports to place O2 back as pt desats to 80s when sleeping.    Follow Up Recommendations  Home health PT;Supervision - Intermittent     Equipment Recommendations  None recommended by PT    Recommendations for Other Services       Precautions / Restrictions Precautions Precautions: Knee Precaution Booklet Issued: No Precaution Comments: Reviewed no pillow under knee and precautions. Restrictions Weight Bearing Restrictions: Yes RLE Weight Bearing: Weight bearing as tolerated    Mobility  Bed Mobility Overal bed mobility: Modified Independent Bed Mobility: Supine to Sit     Supine to sit: Min guard;HOB elevated     General bed mobility comments: Use of rails. No assist needed. Cues for advancement of LEs to improve ease and decreased pain.  pallor remains with mobility.    Transfers Overall transfer level: Needs assistance Equipment used: Rolling walker (2 wheeled) Transfers: Sit to/from Stand Sit to Stand: Min guard         General transfer comment: Min guard to boost from EOB with cues for hand placement/technique. + dizziness, pallor. Transferred to chair post ambulation.  Ambulation/Gait Ambulation/Gait assistance: Min guard Ambulation Distance (Feet): 55 Feet Assistive device: Rolling walker (2 wheeled) Gait Pattern/deviations: Step-through pattern;Decreased stride length;Trunk flexed Gait velocity: decreased   General Gait Details: Slow, unsteady gait with right knee instability noted. No buckling. Pt holding breath at times. +  ligthheadedness, pallor. Cues for R heel strike.     Stairs            Wheelchair Mobility    Modified Rankin (Stroke Patients Only)       Balance                                    Cognition Arousal/Alertness: Awake/alert Behavior During Therapy: WFL for tasks assessed/performed Overall Cognitive Status: Within Functional Limits for tasks assessed                      Exercises Total Joint Exercises Ankle Circles/Pumps: Both;15 reps;Supine Quad Sets: 10 reps;Supine;Right Heel Slides: AAROM;Right;10 reps;Supine Hip ABduction/ADduction: AAROM;Right;10 reps;Supine Straight Leg Raises: AAROM;Right;10 reps;Supine    General Comments        Pertinent Vitals/Pain Pain Assessment: 0-10 Pain Score: 6  Pain Location: R knee with movement Pain Descriptors / Indicators: Grimacing;Guarding;Discomfort;Sore Pain Intervention(s): Monitored during session;Repositioned;Ice applied    Home Living                      Prior Function            PT Goals (current goals can now be found in the care plan section) Acute Rehab PT Goals Patient Stated Goal: to get out of here ASAP Potential to Achieve Goals: Good Progress towards PT goals: Progressing toward goals    Frequency  7X/week    PT Plan Current plan remains appropriate    Co-evaluation  End of Session Equipment Utilized During Treatment: Gait belt Activity Tolerance: Treatment limited secondary to medical complications (Comment) (remains to c/o dizziness) Patient left: in chair;with call bell/phone within reach;with family/visitor present     Time: [id number] PT Time Calculation (min) (ACUTE ONLY): 24 min  Charges:  $Gait Training: 8-22 mins $Therapeutic Exercise: 8-22 mins                    G Codes:      Cristela Blue 2015-06-30, 11:55 AM Governor Rooks, PTA pager [phone]

## 2015-06-23 NOTE — Op Note (Signed)
TOTAL KNEE REPLACEMENT OPERATIVE NOTE:  06/22/2015  5:36 PM  PATIENT:  [name]  60 y.o. female  PRE-OPERATIVE DIAGNOSIS:  primary osteoarthritis right knee  POST-OPERATIVE DIAGNOSIS:  primary osteoarthritis right knee  PROCEDURE:  Procedure(s): TOTAL KNEE ARTHROPLASTY  SURGEON:  Surgeon(s): Vickey Huger, MD  PHYSICIAN ASSISTANT:  Nehemiah Massed, [facility]  ANESTHESIA:   spinal  DRAINS: Hemovac  SPECIMEN: None  COUNTS:  Correct  TOURNIQUET:   Total Tourniquet Time Documented: Thigh (Right) - 43 minutes Total: Thigh (Right) - 43 minutes   DICTATION:  Indication for procedure:    The patient is a 60 y.o. female who has failed conservative treatment for primary osteoarthritis right knee.  Informed consent was obtained prior to anesthesia. The risks versus benefits of the operation were explain and in a way the patient can, and did, understand.   On the implant demand matching protocol, this patient scored 10.  Therefore, this patient did" "did not receive a polyethylene insert with vitamin E which is a high demand implant.  Description of procedure:     The patient was taken to the operating room and placed under anesthesia.  The patient was positioned in the usual fashion taking care that all body parts were adequately padded and/or protected.  I foley catheter was not placed.  A tourniquet was applied and the leg prepped and draped in the usual sterile fashion.  The extremity was exsanguinated with the esmarch and tourniquet inflated to 350 mmHg.  Pre-operative range of motion was normal.  The knee was in 5 degree of mild varus.  A midline incision approximately 6-7 inches long was made with a #10 blade.  A new blade was used to make a parapatellar arthrotomy going 2-3 cm into the quadriceps tendon, over the patella, and alongside the medial aspect of the patellar tendon.  A synovectomy was then performed with the #10 blade and forceps. I then elevated the deep MCL off the  medial tibial metaphysis subperiosteally around to the semimembranosus attachment.    I everted the patella and used calipers to measure patellar thickness.  I used the reamer to ream down to appropriate thickness to recreate the native thickness.  I then removed excess bone with the rongeur and sagittal saw.  I used the appropriately sized template and drilled the three lug holes.  I then put the trial in place and measured the thickness with the calipers to ensure recreation of the native thickness.  The trial was then removed and the patella subluxed and the knee brought into flexion.  A homan retractor was place to retract and protect the patella and lateral structures.  A Z-retractor was place medially to protect the medial structures.  The extra-medullary alignment system was used to make cut the tibial articular surface perpendicular to the anamotic axis of the tibia and in 3 degrees of posterior slope.  The cut surface and alignment jig was removed.  I then used the intramedullary alignment guide to make a 6 valgus cut on the distal femur.  I then marked out the epicondylar axis on the distal femur.  The posterior condylar axis measured 5 degrees.  I then used the anterior referencing sizer and measured the femur to be a size 7.  The 4-In-1 cutting block was screwed into place in external rotation matching the posterior condylar angle, making our cuts perpendicular to the epicondylar axis.  Anterior, posterior and chamfer cuts were made with the sagittal saw.  The cutting block and cut  pieces were removed.  A lamina spreader was placed in 90 degrees of flexion.  The ACL, PCL, menisci, and posterior condylar osteophytes were removed.  A 10 mm spacer blocked was found to offer good flexion and extension gap balance after minimal in degree releasing.   The scoop retractor was then placed and the femoral finishing block was pinned in place.  The small sagittal saw was used as well as the lug drill to  finish the femur.  The block and cut surfaces were removed and the medullary canal hole filled with autograft bone from the cut pieces.  The tibia was delivered forward in deep flexion and external rotation.  A size E tray was selected and pinned into place centered on the medial 1/3 of the tibial tubercle.  The reamer and keel was used to prepare the tibia through the tray.    I then trialed with the size 7 femur, size E tibia, a 10 mm insert and the 32 patella.  I had excellent flexion/extension gap balance, excellent patella tracking.  Flexion was full and beyond 120 degrees; extension was zero.  These components were chosen and the staff opened them to me on the back table while the knee was lavaged copiously and the cement mixed.  The soft tissue was infiltrated with 60cc of exparel 1.3% through a 21 gauge needle.  I cemented in the components and removed all excess cement.  The polyethylene tibial component was snapped into place and the knee placed in extension while cement was hardening.  The capsule was infilltrated with 30cc of .25% Marcaine with epinephrine.  A hemovac was place in the joint exiting superolaterally.  A pain pump was place superomedially superficial to the arthrotomy.  Once the cement was hard, the tourniquet was let down.  Hemostasis was obtained.  The arthrotomy was closed with figure-8 #1 vicryl sutures.  The deep soft tissues were closed with #0 vicryls and the subcuticular layer closed with a running #2-0 vicryl.  The skin was reapproximated and closed with skin staples.  The wound was dressed with xeroform, 4 x4's, 2 ABD sponges, a single layer of webril and a TED stocking.   The patient was then awakened, extubated, and taken to the recovery room in stable condition.  BLOOD LOSS:  300cc DRAINS: 1 hemovac, 1 pain catheter COMPLICATIONS:  None.  PLAN OF CARE: Admit to inpatient   PATIENT DISPOSITION:  PACU - hemodynamically stable.   Delay start of Pharmacological  VTE agent (>24hrs) due to surgical blood loss or risk of bleeding:  not applicable  Please fax a copy of this op note to my office at [phone] (please only include page 1 and 2 of the Case Information op note)

## 2015-06-23 NOTE — Progress Notes (Signed)
Rept to [employer] PA. Pt hgb this AM 7.0. Pt oxy sat 84% on RA when pt falls asleep. IS encouraged and oxygen applied. Pt quickly returns to 99% 2 lpm. No new orders at this time. Will continue to monitor.

## 2015-06-23 NOTE — Evaluation (Signed)
Occupational Therapy Evaluation Patient Details Name: [name] MRN: [id number] DOB: 1955/03/26 Today's Date: 06/23/2015    History of Present Illness Patient is a 60 y/o female with hx of Bells palsy, fibromyalgia, PE, CVA, anxiety and HTN presents s/p Rt TKA.    Clinical Impression   Pt with decline in function and safety with ADLs and ADL mobility with decreased balance and activity tolerance. Pt would benefit from acute OT services to address impairments to increase level of function and safety to return home. Pt has DME at home and plans to get a reacher    Follow Up Recommendations  Supervision - Intermittent;No OT follow up    Equipment Recommendations  None recommended by OT    Recommendations for Other Services       Precautions / Restrictions Precautions Precautions: Knee Precaution Booklet Issued: No Precaution Comments: Reviewed no pillow under knee Restrictions Weight Bearing Restrictions: Yes RLE Weight Bearing: Weight bearing as tolerated      Mobility Bed Mobility Overal bed mobility: Modified Independent Bed Mobility: Supine to Sit     Supine to sit: Min guard;HOB elevated     General bed mobility comments: pt up in recliner upon entering room  Transfers Overall transfer level: Needs assistance Equipment used: Rolling walker (2 wheeled) Transfers: Sit to/from Stand Sit to Stand: Min guard         General transfer comment: Min guard to boost from EOB with cues for hand placement/technique. + dizziness, pallor. Transferred to chair post ambulation.    Balance Overall balance assessment: Needs assistance Sitting-balance support: No upper extremity supported;Feet supported Sitting balance-Leahy Scale: Good     Standing balance support: During functional activity Standing balance-Leahy Scale: Fair                              ADL Overall ADL's : Needs assistance/impaired     Grooming: Wash/dry hands;Wash/dry face;Min  guard;Standing   Upper Body Bathing: Supervision/ safety;Set up;Sitting   Lower Body Bathing: Moderate assistance   Upper Body Dressing : Supervision/safety;Set up;Sitting   Lower Body Dressing: Maximal assistance   Toilet Transfer: Min guard;RW;Comfort height toilet;Ambulation   Toileting- Clothing Manipulation and Hygiene: Minimal assistance;Sit to/from stand   Tub/ Shower Transfer: 3 in 1;Rolling walker;Ambulation   Functional mobility during ADLs: Min guard General ADL Comments: Pt is familar with ADL A/E and reviewed use of reacher for LB dressing     Vision  no change from baseline              Pertinent Vitals/Pain Pain Assessment: 0-10 Pain Score: 3  Pain Location: R knee Pain Descriptors / Indicators: Aching;Sore Pain Intervention(s): Premedicated before session;Monitored during session;Repositioned     Hand Dominance Right   Extremity/Trunk Assessment Upper Extremity Assessment Upper Extremity Assessment: Overall WFL for tasks assessed   Lower Extremity Assessment Lower Extremity Assessment: Defer to PT evaluation       Communication Communication Communication: No difficulties   Cognition Arousal/Alertness: Awake/alert Behavior During Therapy: WFL for tasks assessed/performed Overall Cognitive Status: Within Functional Limits for tasks assessed                     General Comments   pt very pleasant and cooperative                 Home Living   Living Arrangements: Spouse/significant other Available Help at Discharge: Family;Available 24 hours/day Type of Home: House  Home Access: Stairs to enter Entrance Stairs-Number of Steps: 3 Entrance Stairs-Rails: Right Home Layout: Two level;Bed/bath upstairs     Bathroom Shower/Tub: Occupational psychologist: Standard     Home Equipment: Environmental consultant - 2 wheels;Bedside commode;Shower seat          Prior Functioning/Environment Level of Independence: Independent with  assistive device(s)        Comments: uses cane    OT Diagnosis: Acute pain   OT Problem List: Pain;Impaired balance (sitting and/or standing);Decreased activity tolerance   OT Treatment/Interventions: Self-care/ADL training;Patient/family education;Therapeutic activities;DME and/or AE instruction    OT Goals(Current goals can be found in the care plan section) Acute Rehab OT Goals Patient Stated Goal: go home today OT Goal Formulation: With patient Time For Goal Achievement: 06/30/15 Potential to Achieve Goals: Good ADL Goals Pt Will Perform Grooming: with set-up;with supervision;standing Pt Will Perform Lower Body Bathing: with caregiver independent in assisting;with min assist;sitting/lateral leans;sit to/from stand Pt Will Perform Lower Body Dressing: with mod assist;with min assist;with caregiver independent in assisting;sitting/lateral leans;sit to/from stand Pt Will Transfer to Toilet: with supervision;ambulating (3 in 1) Pt Will Perform Toileting - Clothing Manipulation and hygiene: with min guard assist;with supervision;sit to/from stand Pt Will Perform Tub/Shower Transfer: with supervision;shower seat;3 in 1;ambulating;rolling walker  OT Frequency: Min 2X/week   Barriers to D/C:    none                     End of Session Equipment Utilized During Treatment: Rolling walker;Other (comment);Gait belt (3 in 1) CPM Right Knee CPM Right Knee: Off  Activity Tolerance: Patient tolerated treatment well Patient left: in chair;with call bell/phone within reach;with family/visitor present   Time: 1319-1340 OT Time Calculation (min): 21 min Charges:  OT General Charges $OT Visit: 1 Procedure OT Evaluation $OT Eval Moderate Complexity: 1 Procedure G-Codes:    Britt Bottom 06/23/2015, 2:05 PM

## 2015-06-24 LAB — CBC
HCT: 20.4 % — ABNORMAL LOW (ref 36.0–46.0)
Hemoglobin: 6.2 g/dL — CL (ref 12.0–15.0)
MCH: 33.9 pg (ref 26.0–34.0)
MCHC: 30.4 g/dL (ref 30.0–36.0)
MCV: 111.5 fL — ABNORMAL HIGH (ref 78.0–100.0)
Platelets: 323 10*3/uL (ref 150–400)
RBC: 1.83 MIL/uL — ABNORMAL LOW (ref 3.87–5.11)
RDW: 21.2 % — ABNORMAL HIGH (ref 11.5–15.5)
WBC: 7.2 10*3/uL (ref 4.0–10.5)

## 2015-06-24 LAB — CBC WITH DIFFERENTIAL/PLATELET
Basophils Absolute: 0.1 10*3/uL (ref 0.0–0.1)
Basophils Relative: 1 %
Eosinophils Absolute: 0.1 10*3/uL (ref 0.0–0.7)
Eosinophils Relative: 2 %
HCT: 25.5 % — ABNORMAL LOW (ref 36.0–46.0)
Hemoglobin: 8.1 g/dL — ABNORMAL LOW (ref 12.0–15.0)
Lymphocytes Relative: 20 %
Lymphs Abs: 1.4 10*3/uL (ref 0.7–4.0)
MCH: 33.1 pg (ref 26.0–34.0)
MCHC: 31.8 g/dL (ref 30.0–36.0)
MCV: 104.1 fL — ABNORMAL HIGH (ref 78.0–100.0)
Monocytes Absolute: 0.5 10*3/uL (ref 0.1–1.0)
Monocytes Relative: 7 %
Neutro Abs: 4.7 10*3/uL (ref 1.7–7.7)
Neutrophils Relative %: 70 %
Platelets: 322 10*3/uL (ref 150–400)
RBC: 2.45 MIL/uL — ABNORMAL LOW (ref 3.87–5.11)
RDW: 21.3 % — ABNORMAL HIGH (ref 11.5–15.5)
WBC: 6.8 10*3/uL (ref 4.0–10.5)

## 2015-06-24 LAB — BASIC METABOLIC PANEL
Anion gap: 7 (ref 5–15)
BUN: 16 mg/dL (ref 6–20)
CO2: 25 mmol/L (ref 22–32)
Calcium: 8.9 mg/dL (ref 8.9–10.3)
Chloride: 105 mmol/L (ref 101–111)
Creatinine, Ser: 1.13 mg/dL — ABNORMAL HIGH (ref 0.44–1.00)
GFR calc Af Amer: >60 mL/min (ref >60)
GFR calc non Af Amer: 53 mL/min — ABNORMAL LOW (ref >60)
Glucose, Bld: 118 mg/dL — ABNORMAL HIGH (ref 65–99)
Potassium: 4.5 mmol/L (ref 3.5–5.1)
Sodium: 137 mmol/L (ref 135–145)

## 2015-06-24 LAB — PREPARE RBC (CROSSMATCH)

## 2015-06-24 MED ORDER — SODIUM CHLORIDE 0.9 % IV SOLN
Freq: Once | INTRAVENOUS | Status: AC
  Administered 2015-06-24: 14:00:00 via INTRAVENOUS

## 2015-06-24 NOTE — Progress Notes (Signed)
Critical hgb level 6.2. On call MD notified. Patient stable, vs wnl, been out of bed with staff to bathroom without any complaints of dizziness. Orders given to relay message to MD/PA in the morning during rounds. Will continue to monitor patient.

## 2015-06-24 NOTE — Progress Notes (Signed)
Occupational Therapy Treatment Patient Details Name: GLENDOLA RENDEROS MRN: [id number] DOB: 1955-06-04 Today's Date: 06/24/2015    History of present illness Patient is a 60 y/o female with hx of Bells palsy, fibromyalgia, PE, CVA, anxiety and HTN presents s/p Rt TKA.    OT comments  Pt making good progress with functional goals and is scheduled to d/c home today  Follow Up Recommendations  Supervision - Intermittent;No OT follow up    Equipment Recommendations  None recommended by OT    Recommendations for Other Services      Precautions / Restrictions Precautions Precautions: Knee Restrictions Weight Bearing Restrictions: Yes RLE Weight Bearing: Weight bearing as tolerated       Mobility Bed Mobility Overal bed mobility: Modified Independent Bed Mobility: Supine to Sit     Supine to sit: HOB elevated;Modified independent (Device/Increase time)        Transfers Overall transfer level: Needs assistance Equipment used: Rolling walker (2 wheeled) Transfers: Sit to/from Stand Sit to Stand: Supervision              Balance Overall balance assessment: Needs assistance   Sitting balance-Leahy Scale: Good     Standing balance support: During functional activity Standing balance-Leahy Scale: Fair                     ADL       Grooming: Wash/dry hands;Wash/dry face;Standing;Set up;Supervision/safety                   Toilet Transfer: RW;Comfort height toilet;Ambulation;Supervision/safety   Toileting- Clothing Manipulation and Hygiene: Sit to/from stand;Supervision/safety       Functional mobility during ADLs: Supervision/safety General ADL Comments: pt declined bathing/shower with OT stating that she was going home today and wanted to wait and take a shower in her own home/bathroom      Vision  no change form baseline                              Cognition   Behavior During Therapy: WFL for tasks  assessed/performed Overall Cognitive Status: Within Functional Limits for tasks assessed                       Extremity/Trunk Assessment   WFL                        General Comments  pt very pleasant and cooperative    Pertinent Vitals/ Pain       Pain Assessment: 0-10 Pain Score: 4  Pain Location: R knee Pain Descriptors / Indicators: Sore Pain Intervention(s): Monitored during session;Repositioned  Home Living  with family                                        Prior Functioning/Environment  independent           Frequency Min 2X/week     Progress Toward Goals  OT Goals(current goals can now be found in the care plan section)  Progress towards OT goals: Progressing toward goals     Plan Discharge plan remains appropriate                     End of Session Equipment Utilized During Treatment: Rolling walker;Other (comment) (3 in 1)  Activity Tolerance Patient tolerated treatment well   Patient Left in chair;with call bell/phone within reach;with family/visitor present             Time: 0910-0926 OT Time Calculation (min): 16 min  Charges: OT General Charges $OT Visit: 1 Procedure OT Treatments $Self Care/Home Management : 8-22 mins  Emmit Alexanders [facility] 06/24/2015, 11:13 AM

## 2015-06-24 NOTE — Progress Notes (Signed)
Orthopedic Tech Progress Note Patient Details:  [name] [birth year] [id number]  Patient ID: [name], female   DOB: 09/20/1955, 60 y.o.   MRN: [id number] Placed pt's rle on cpm @0 -90 degrees @ [place], [place] 06/24/2015, 2:25 PM

## 2015-06-24 NOTE — Progress Notes (Signed)
PT Cancellation Note  Patient Details Name: [name] MRN: [id number] DOB: 1955/08/02   Cancelled Treatment:    Reason Eval/Treat Not Completed: Medical issues which prohibited therapy (HGB 6.2, PA reports in note may tranfuse will continue to follow patient.  )   Cristela Blue 06/24/2015, 9:31 AM  Governor Rooks, PTA pager [phone]

## 2015-06-24 NOTE — Discharge Summary (Signed)
SPORTS MEDICINE & JOINT REPLACEMENT   Diana Mulch, MD   Carlynn Spry, PA-C Carlyon Shadow, PA-C [place], [place], [place]  [address]                             [phone]  PATIENT ID: [name]        MRN:  [id number]          DOB/AGE: May 05, 1955 / 60 y.o.    DISCHARGE SUMMARY  ADMISSION DATE:    06/22/2015 DISCHARGE DATE:   06/24/2015   ADMISSION DIAGNOSIS: primary osteoarthritis right knee    DISCHARGE DIAGNOSIS:  primary osteoarthritis right knee    ADDITIONAL DIAGNOSIS: Active Problems:   Primary localized osteoarthritis of right knee  Past Medical History  Diagnosis Date  . Morbid obesity ([place])   . Bell palsy   . Fibromyalgia   . Pulmonary embolism ([place]) 01/2008; 03/2014    "2; 1"  . Colon polyps   . Seasonal allergies   . Wears glasses   . History of blood transfusion 01/2014    "related to anemia"  . Anxiety   . Tumor, thyroid 2010    x 3  . GERD (gastroesophageal reflux disease)   . Anemia   . Hypertension   . High cholesterol   . Migraine headache     "2-3 times/year" (06/22/2015)  . Stroke ([place]) 03/2014    denies residual on 06/22/2015  . Arthritis     "knees, elbows, back" (06/22/2015)  . Chronic lower back pain   . Depression   . Nephrolithiasis     "passed them all" (06/22/2015)    PROCEDURE: Procedure(s): TOTAL KNEE ARTHROPLASTY on 06/22/2015  CONSULTS:     HISTORY:  See H&P in chart  HOSPITAL COURSE:  [name] is a 60 y.o. admitted on 06/22/2015 and found to have a diagnosis of primary osteoarthritis right knee.  After appropriate laboratory studies were obtained  they were taken to the operating room on 06/22/2015 and underwent Procedure(s): TOTAL KNEE ARTHROPLASTY.   They were given perioperative antibiotics:  Anti-infectives    Start     Dose/Rate Route Frequency Ordered Stop   06/22/15 1800  ceFAZolin (ANCEF) IVPB 2g/100 mL premix     2 g 200 mL/hr over 30 Minutes Intravenous Every 6 hours 06/22/15 1513 06/23/15  0029   06/22/15 0930  ceFAZolin (ANCEF) IVPB 2g/100 mL premix     2 g 200 mL/hr over 30 Minutes Intravenous To ShortStay Surgical 06/21/15 1123 06/22/15 1158    .  Patient given tranexamic acid IV or topical and exparel intra-operatively.  Tolerated the procedure well.    POD# 1: Vital signs were stable.  Patient denied Chest pain, shortness of breath, or calf pain.  Patient was started on Lovenox 30 mg subcutaneously twice daily at 8am.  Consults to PT, OT, and care management were made.  The patient was weight bearing as tolerated.  CPM was placed on the operative leg 0-90 degrees for 6-8 hours a day. When out of the CPM, patient was placed in the foam block to achieve full extension. Incentive spirometry was taught.  Dressing was changed.       POD #2, Continued  PT for ambulation and exercise program.  IV saline locked.  O2 discontinued.    The remainder of the hospital course was dedicated to ambulation and strengthening.   The patient was discharged on 2 Days Post-Op in  Good condition.  Blood products given:2 units PRBc  DIAGNOSTIC STUDIES: Recent vital signs: Patient Vitals for the past 24 hrs:  BP Temp Temp src Pulse Resp SpO2  06/24/15 1445 (!) 121/49 mmHg 98.3 F (36.8 C) Oral 92 15 95 %  06/24/15 1400 (!) 129/55 mmHg 98.3 F (36.8 C) Oral 96 15 100 %  06/24/15 1357 (!) 129/55 mmHg 98.3 F (36.8 C) Oral 97 15 100 %  06/24/15 1135 106/61 mmHg 97.8 F (36.6 C) Oral 91 15 94 %  06/24/15 1102 (!) 116/57 mmHg 97.5 F (36.4 C) Oral 98 15 100 %  06/24/15 0650 (!) 122/58 mmHg 98.1 F (36.7 C) Oral 86 15 98 %  06/23/15 2017 (!) 113/51 mmHg 98.9 F (37.2 C) Oral 94 16 99 %       Recent laboratory studies:  Recent Labs  06/23/15 0306 06/24/15 0450  WBC 4.4 7.2  HGB 7.0* 6.2*  HCT 22.4* 20.4*  PLT 362 323    Recent Labs  06/22/15 0954 06/23/15 0306 06/24/15 0450  NA 138 137 137  K 4.1 4.3 4.5  CL 105 103 105  CO2 26 28 25   BUN 13 12 16   CREATININE 1.27*  1.25* 1.13*  GLUCOSE 101* 121* 118*  CALCIUM 9.4 8.6* 8.9   Lab Results  Component Value Date   INR 1.11 06/22/2015   INR 1.60* 06/12/2015   INR 0.99 01/28/2009     Recent Radiographic Studies :  No results found.  DISCHARGE INSTRUCTIONS: Discharge Instructions    CPM    Complete by:  As directed   Continuous passive motion machine (CPM):      Use the CPM from 0 to 90 for 4-6 hours per day.      You may increase by 10 per day.  You may break it up into 2 or 3 sessions per day.      Use CPM for 2 weeks or until you are told to stop.     Call MD / Call 911    Complete by:  As directed   If you experience chest pain or shortness of breath, CALL 911 and be transported to the hospital emergency room.  If you develope a fever above 101 F, pus (white drainage) or increased drainage or redness at the wound, or calf pain, call your surgeon's office.     Constipation Prevention    Complete by:  As directed   Drink plenty of fluids.  Prune juice may be helpful.  You may use a stool softener, such as Colace (over the counter) 100 mg twice a day.  Use MiraLax (over the counter) for constipation as needed.     Diet - low sodium heart healthy    Complete by:  As directed      Discharge instructions    Complete by:  As directed   INSTRUCTIONS AFTER JOINT REPLACEMENT   Remove items at home which could result in a fall. This includes throw rugs or furniture in walking pathways ICE to the affected joint every three hours while awake for 30 minutes at a time, for at least the first 3-5 days, and then as needed for pain and swelling.  Continue to use ice for pain and swelling. You may notice swelling that will progress down to the foot and ankle.  This is normal after surgery.  Elevate your leg when you are not up walking on it.   Continue to use the breathing machine you got in the hospital (  incentive spirometer) which will help keep your temperature down.  It is common for your temperature to cycle up  and down following surgery, especially at night when you are not up moving around and exerting yourself.  The breathing machine keeps your lungs expanded and your temperature down.   DIET:  As you were doing prior to hospitalization, we recommend a well-balanced diet.  DRESSING / WOUND CARE / SHOWERING  You may change your dressing 3-5 days after surgery.  Then change the dressing every day with sterile gauze.  Please use good hand washing techniques before changing the dressing.  Do not use any lotions or creams on the incision until instructed by your surgeon.  ACTIVITY  Increase activity slowly as tolerated, but follow the weight bearing instructions below.   No driving for 6 weeks or until further direction given by your physician.  You cannot drive while taking narcotics.  No lifting or carrying greater than 10 lbs. until further directed by your surgeon. Avoid periods of inactivity such as sitting longer than an hour when not asleep. This helps prevent blood clots.  You may return to work once you are authorized by your doctor.     WEIGHT BEARING   Weight bearing as tolerated with assist device (walker, cane, etc) as directed, use it as long as suggested by your surgeon or therapist, typically at least 4-6 weeks.   EXERCISES  Results after joint replacement surgery are often greatly improved when you follow the exercise, range of motion and muscle strengthening exercises prescribed by your doctor. Safety measures are also important to protect the joint from further injury. Any time any of these exercises cause you to have increased pain or swelling, decrease what you are doing until you are comfortable again and then slowly increase them. If you have problems or questions, call your caregiver or physical therapist for advice.   Rehabilitation is important following a joint replacement. After just a few days of immobilization, the muscles of the leg can become weakened and shrink  (atrophy).  These exercises are designed to build up the tone and strength of the thigh and leg muscles and to improve motion. Often times heat used for twenty to thirty minutes before working out will loosen up your tissues and help with improving the range of motion but do not use heat for the first two weeks following surgery (sometimes heat can increase post-operative swelling).   These exercises can be done on a training (exercise) mat, on the floor, on a table or on a bed. Use whatever works the best and is most comfortable for you.    Use music or television while you are exercising so that the exercises are a pleasant break in your day. This will make your life better with the exercises acting as a break in your routine that you can look forward to.   Perform all exercises about fifteen times, three times per day or as directed.  You should exercise both the operative leg and the other leg as well.   Exercises include:   Quad Sets - Tighten up the muscle on the front of the thigh (Quad) and hold for 5-10 seconds.   Straight Leg Raises - With your knee straight (if you were given a brace, keep it on), lift the leg to 60 degrees, hold for 3 seconds, and slowly lower the leg.  Perform this exercise against resistance later as your leg gets stronger.  Leg Slides: Lying on your back,  slowly slide your foot toward your buttocks, bending your knee up off the floor (only go as far as is comfortable). Then slowly slide your foot back down until your leg is flat on the floor again.  Angel Wings: Lying on your back spread your legs to the side as far apart as you can without causing discomfort.  Hamstring Strength:  Lying on your back, push your heel against the floor with your leg straight by tightening up the muscles of your buttocks.  Repeat, but this time bend your knee to a comfortable angle, and push your heel against the floor.  You may put a pillow under the heel to make it more comfortable if  necessary.   A rehabilitation program following joint replacement surgery can speed recovery and prevent re-injury in the future due to weakened muscles. Contact your doctor or a physical therapist for more information on knee rehabilitation.    CONSTIPATION  Constipation is defined medically as fewer than three stools per week and severe constipation as less than one stool per week.  Even if you have a regular bowel pattern at home, your normal regimen is likely to be disrupted due to multiple reasons following surgery.  Combination of anesthesia, postoperative narcotics, change in appetite and fluid intake all can affect your bowels.   YOU MUST use at least one of the following options; they are listed in order of increasing strength to get the job done.  They are all available over the counter, and you may need to use some, POSSIBLY even all of these options:    Drink plenty of fluids (prune juice may be helpful) and high fiber foods Colace 100 mg by mouth twice a day  Senokot for constipation as directed and as needed Dulcolax (bisacodyl), take with full glass of water  Miralax (polyethylene glycol) once or twice a day as needed.  If you have tried all these things and are unable to have a bowel movement in the first 3-4 days after surgery call either your surgeon or your primary doctor.    If you experience loose stools or diarrhea, hold the medications until you stool forms back up.  If your symptoms do not get better within 1 week or if they get worse, check with your doctor.  If you experience "the worst abdominal pain ever" or develop nausea or vomiting, please contact the office immediately for further recommendations for treatment.   ITCHING:  If you experience itching with your medications, try taking only a single pain pill, or even half a pain pill at a time.  You can also use Benadryl over the counter for itching or also to help with sleep.   TED HOSE STOCKINGS:  Use stockings  on both legs until for at least 2 weeks or as directed by physician office. They may be removed at night for sleeping.  MEDICATIONS:  See your medication summary on the "After Visit Summary" that nursing will review with you.  You may have some home medications which will be placed on hold until you complete the course of blood thinner medication.  It is important for you to complete the blood thinner medication as prescribed.  PRECAUTIONS:  If you experience chest pain or shortness of breath - call 911 immediately for transfer to the hospital emergency department.   If you develop a fever greater that 101 F, purulent drainage from wound, increased redness or drainage from wound, foul odor from the wound/dressing, or calf pain - CONTACT  YOUR SURGEON.                                                   FOLLOW-UP APPOINTMENTS:  If you do not already have a post-op appointment, please call the office for an appointment to be seen by your surgeon.  Guidelines for how soon to be seen are listed in your "After Visit Summary", but are typically between 1-4 weeks after surgery.  OTHER INSTRUCTIONS:   Knee Replacement:  Do not place pillow under knee, focus on keeping the knee straight while resting. CPM instructions: 0-90 degrees, 2 hours in the morning, 2 hours in the afternoon, and 2 hours in the evening. Place foam block, curve side up under heel at all times except when in CPM or when walking.  DO NOT modify, tear, cut, or change the foam block in any way.  MAKE SURE YOU:  Understand these instructions.  Get help right away if you are not doing well or get worse.    Thank you for letting us be a part of your medical care team.  It is a privilege we respect greatly.  We hope these instructions will help you stay on track for a fast and full recovery!     Increase activity slowly as tolerated    Complete by:  As directed            DISCHARGE MEDICATIONS:     Medication List    STOP taking  these medications        HYDROcodone-acetaminophen 10-325 MG tablet  Commonly known as:  NORCO      TAKE these medications        allopurinol 100 MG tablet  Commonly known as:  ZYLOPRIM  Take 100 mg by mouth daily.     ARIPiprazole 5 MG tablet  Commonly known as:  ABILIFY  Take 5 mg by mouth daily.     aspirin-acetaminophen-caffeine 250-250-65 MG tablet  Commonly known as:  EXCEDRIN MIGRAINE  Take 1 tablet by mouth every 6 (six) hours as needed for pain.     buPROPion 300 MG 24 hr tablet  Commonly known as:  WELLBUTRIN XL  Take 300 mg by mouth every morning.     cetirizine 10 MG tablet  Commonly known as:  ZYRTEC  Take 10 mg by mouth daily as needed for allergies.     docusate sodium 50 MG capsule  Commonly known as:  COLACE  Take 100 mg by mouth every morning.     esomeprazole 40 MG capsule  Commonly known as:  NEXIUM  Take 40 mg by mouth daily before breakfast.     folic acid [id number] MCG tablet  Commonly known as:  FOLVITE  Take 800 mcg by mouth daily.     furosemide 20 MG tablet  Commonly known as:  LASIX  Take 20 mg by mouth daily as needed for fluid.     gabapentin 600 MG tablet  Commonly known as:  NEURONTIN  Take 600 mg by mouth 3 (three) times daily.     lisinopril 20 MG tablet  Commonly known as:  PRINIVIL,ZESTRIL  Take 20 mg by mouth daily.     methocarbamol 500 MG tablet  Commonly known as:  ROBAXIN  Take 1-2 tablets (500-1,000 mg total) by mouth every 6 (six) hours as needed for muscle spasms.  multivitamin tablet  Take 1 tablet by mouth daily.     ondansetron 4 MG tablet  Commonly known as:  ZOFRAN  Take 1 tablet (4 mg total) by mouth every 6 (six) hours as needed for nausea.     oxyCODONE 5 MG immediate release tablet  Commonly known as:  Oxy IR/ROXICODONE  Take 1-2 tablets (5-10 mg total) by mouth every 4 (four) hours as needed for breakthrough pain.     REFRESH 1.4-0.6 % ophthalmic solution  Generic drug:  polyvinyl alcohol-povidone   Place 1-2 drops into both eyes as needed (for dry eyes).     SAVELLA 25 MG Tabs  Generic drug:  Milnacipran HCl  Take 25 mg by mouth 2 (two) times daily before a meal.     sertraline 100 MG tablet  Commonly known as:  ZOLOFT  Take 150 mg by mouth every morning.     simvastatin 20 MG tablet  Commonly known as:  ZOCOR  Take 20 mg by mouth at bedtime.     SUMAtriptan 25 MG tablet  Commonly known as:  IMITREX  Take 25 mg by mouth every 2 (two) hours as needed for migraine.     Vitamin B-12 2500 MCG Subl  Place 2,500 mcg under the tongue daily.     white petrolatum Gel  Commonly known as:  VASELINE  Apply 1 application topically as needed (under lower lid and upper lid to prevent drying).     XARELTO 20 MG Tabs tablet  Generic drug:  rivaroxaban  Take 20 mg by mouth daily with supper.        FOLLOW UP VISIT:       Follow-up Information    Follow up with [facility].   Why:  Someone from [place] at [facility]) will contact you to arrange start date and time for therapy   Contact information:   [place] [address] [phone]       DISPOSITION: HOME VS. SNF  CONDITION:  Good   Donia Ast 06/24/2015, 4:41 PM

## 2015-06-24 NOTE — Progress Notes (Signed)
Lab results, hgb 8.1 post 2 units of PRBC transfusion. Patient discharged home per orders.

## 2015-06-24 NOTE — Progress Notes (Signed)
CRITICAL VALUE ALERT  Critical value received:  hgb 6.2  Date of notification:  06/24/2015  Time of notification:  0515  Critical value read back:Yes.    Nurse who received alert:  Elder Davidian  MD notified (1st page):  Mardelle Matte  Time of first page:  0526  MD notified (2nd page):  Time of second page:  Responding MD:  Mardelle Matte  Time MD responded:  [phone]

## 2015-06-24 NOTE — Progress Notes (Signed)
Physical Therapy Treatment Patient Details Name: [name] MRN: [id number] DOB: [birth year] Today's Date: 06/24/2015    History of Present Illness Patient is a 60 y/o female with hx of Bells palsy, fibromyalgia, PE, CVA, anxiety and HTN presents s/p Rt TKA.     PT Comments    Pt performed increased mobility, No complaints of dizziness.  Pt just finished second unit of blood before tx was performed.  Will f/u in am if patient remain hospitalized otherwise pt is set for d/c home.    Follow Up Recommendations  Home health PT;Supervision - Intermittent     Equipment Recommendations  None recommended by PT    Recommendations for Other Services       Precautions / Restrictions Precautions Precautions: Knee Precaution Booklet Issued: Yes (comment) Precaution Comments: Reviewed no pillow under knee Restrictions Weight Bearing Restrictions: Yes RLE Weight Bearing: Weight bearing as tolerated    Mobility  Bed Mobility Overal bed mobility: Modified Independent             General bed mobility comments: Good technique no assist needed.    Transfers Overall transfer level: Needs assistance Equipment used: Rolling walker (2 wheeled) Transfers: Sit to/from Stand Sit to Stand: Supervision         General transfer comment: Cues for hand placement and advancement of RLE forward no assist needed.   Ambulation/Gait Ambulation/Gait assistance: Supervision Ambulation Distance (Feet): 250 Feet Assistive device: Rolling walker (2 wheeled) Gait Pattern/deviations: Step-through pattern;Antalgic;Trunk flexed Gait velocity: decreased Gait velocity interpretation: Below normal speed for age/gender General Gait Details: Improved gait speed, required cues for gait symmetry and RW safety.     Stairs            Wheelchair Mobility    Modified Rankin (Stroke Patients Only)       Balance Overall balance assessment: Needs assistance   Sitting balance-Leahy Scale:  Good       Standing balance-Leahy Scale: Fair                      Cognition Arousal/Alertness: Awake/alert Behavior During Therapy: WFL for tasks assessed/performed Overall Cognitive Status: Within Functional Limits for tasks assessed                      Exercises Total Joint Exercises Ankle Circles/Pumps: AROM;Both;10 reps;Supine Quad Sets: AROM;Right;10 reps;Supine Towel Squeeze: AROM;Right;10 reps;Supine Short Arc Quad: AROM;Right;10 reps;Supine;AAROM Heel Slides: AAROM;Right;10 reps;Supine Hip ABduction/ADduction: AROM;AAROM;Right;10 reps;Supine Straight Leg Raises: AROM;Right;10 reps;Supine Long Arc Quad: AROM;Right;10 reps;Supine Knee Flexion: AROM;AAROM;Seated;Right (1x5 AROM with 10 sec hold and 1x5 reps AAROM with 10 sec hold.  ) Goniometric ROM: 3-82 degrees R Knee.      General Comments        Pertinent Vitals/Pain Pain Assessment: 0-10 Pain Score: 4  Pain Location: R knee Pain Descriptors / Indicators: Sore Pain Intervention(s): Monitored during session;Repositioned    Home Living                      Prior Function            PT Goals (current goals can now be found in the care plan section) Acute Rehab PT Goals Patient Stated Goal: go home today Potential to Achieve Goals: Good Progress towards PT goals: Progressing toward goals    Frequency  7X/week    PT Plan Current plan remains appropriate    Co-evaluation  End of Session Equipment Utilized During Treatment: Gait belt Activity Tolerance: Patient tolerated treatment well Patient left: in chair;with call bell/phone within reach;with family/visitor present     Time: [id number] PT Time Calculation (min) (ACUTE ONLY): 24 min  Charges:  $Gait Training: 8-22 mins $Therapeutic Exercise: 8-22 mins                    G Codes:      Cristela Blue 16-Jul-2015, 5:45 PM  [place], PTA pager [phone]

## 2015-06-24 NOTE — Progress Notes (Signed)
SPORTS MEDICINE AND JOINT REPLACEMENT  Lara Mulch, MD   [facility] PA-C [place], [place], [place]  [address]                             [phone]   PROGRESS NOTE  Subjective:  negative for Chest Pain  negative for Shortness of Breath  negative for Nausea/Vomiting   negative for Calf Pain  negative for Bowel Movement   Tolerating Diet: yes         Patient reports pain as 5 on 0-10 scale.    Objective: Vital signs in last 24 hours:   Patient Vitals for the past 24 hrs:  BP Temp Temp src Pulse Resp SpO2  06/24/15 0650 (!) 122/58 mmHg 98.1 F (36.7 C) Oral 86 15 98 %  06/23/15 2017 (!) 113/51 mmHg 98.9 F (37.2 C) Oral 94 16 99 %  06/23/15 1300 (!) 136/57 mmHg 99.1 F (37.3 C) Oral 98 16 96 %  06/23/15 1020 (!) 143/60 mmHg 99 F (37.2 C) - 92 18 99 %    @flow {1959:LAST@   Intake/Output from previous day:   06/20 0701 - 06/21 0700 In: 640 [P.O.:240; I.V.:400] Out: -    Intake/Output this shift:       Intake/Output      06/20 0701 - 06/21 0700   P.O. 240   I.V. 400   Total Intake 640   Net +640       Urine Occurrence 4 x      LABORATORY DATA:  Recent Labs  06/23/15 0306 06/24/15 0450  WBC 4.4 7.2  HGB 7.0* 6.2*  HCT 22.4* 20.4*  PLT 362 323    Recent Labs  06/22/15 0954 06/23/15 0306 06/24/15 0450  NA 138 137 137  K 4.1 4.3 4.5  CL 105 103 105  CO2 26 28 25   BUN 13 12 16   CREATININE 1.27* 1.25* 1.13*  GLUCOSE 101* 121* 118*  CALCIUM 9.4 8.6* 8.9   Lab Results  Component Value Date   INR 1.11 06/22/2015   INR 1.60* 06/12/2015   INR 0.99 01/28/2009    Examination:  General appearance: alert, cooperative and no distress Extremities: extremities normal, atraumatic, no cyanosis or edema  Wound Exam: clean, dry, intact   Drainage:  None: wound tissue dry  Motor Exam: Quadriceps and Hamstrings Intact  Sensory Exam: Superficial Peroneal, Deep Peroneal and Tibial normal   Assessment:    2 Days Post-Op   Procedure(s) (LRB): TOTAL KNEE ARTHROPLASTY (Right)  ADDITIONAL DIAGNOSIS:  Active Problems:   Primary localized osteoarthritis of right knee  Acute Blood Loss Anemia   Plan: Physical Therapy as ordered Weight Bearing as Tolerated (WBAT)  DVT Prophylaxis:  Xarelto  DISCHARGE PLAN: Home  DISCHARGE NEEDS: HHPT   Patient looks good, HGB dropped to 6.2, will speak to Dr Ronnie Derby about transfusing PRBC and then D/C later this evening.          Donia Ast 06/24/2015, 6:53 AM

## 2015-06-24 NOTE — Progress Notes (Signed)
Reviewed discharge information with patient.  Answered all her questions.

## 2015-06-25 DIAGNOSIS — Z7901 Long term (current) use of anticoagulants: Secondary | ICD-10-CM | POA: Diagnosis not present

## 2015-06-25 DIAGNOSIS — Z471 Aftercare following joint replacement surgery: Secondary | ICD-10-CM | POA: Diagnosis not present

## 2015-06-25 DIAGNOSIS — Z79891 Long term (current) use of opiate analgesic: Secondary | ICD-10-CM | POA: Diagnosis not present

## 2015-06-25 DIAGNOSIS — Z6839 Body mass index (BMI) 39.0-39.9, adult: Secondary | ICD-10-CM | POA: Diagnosis not present

## 2015-06-25 DIAGNOSIS — Z86711 Personal history of pulmonary embolism: Secondary | ICD-10-CM | POA: Diagnosis not present

## 2015-06-25 DIAGNOSIS — G510 Bell's palsy: Secondary | ICD-10-CM | POA: Diagnosis not present

## 2015-06-25 DIAGNOSIS — Z96651 Presence of right artificial knee joint: Secondary | ICD-10-CM | POA: Diagnosis not present

## 2015-06-25 DIAGNOSIS — Z9181 History of falling: Secondary | ICD-10-CM | POA: Diagnosis not present

## 2015-06-25 DIAGNOSIS — I10 Essential (primary) hypertension: Secondary | ICD-10-CM | POA: Diagnosis not present

## 2015-06-25 DIAGNOSIS — M797 Fibromyalgia: Secondary | ICD-10-CM | POA: Diagnosis not present

## 2015-06-25 DIAGNOSIS — Z8673 Personal history of transient ischemic attack (TIA), and cerebral infarction without residual deficits: Secondary | ICD-10-CM | POA: Diagnosis not present

## 2015-06-25 LAB — TYPE AND SCREEN
ABO/RH(D): A POS
Antibody Screen: NEGATIVE
Unit division: 0
Unit division: 0

## 2015-06-29 DIAGNOSIS — Z6839 Body mass index (BMI) 39.0-39.9, adult: Secondary | ICD-10-CM | POA: Diagnosis not present

## 2015-06-29 DIAGNOSIS — G510 Bell's palsy: Secondary | ICD-10-CM | POA: Diagnosis not present

## 2015-06-29 DIAGNOSIS — Z86711 Personal history of pulmonary embolism: Secondary | ICD-10-CM | POA: Diagnosis not present

## 2015-06-29 DIAGNOSIS — Z9181 History of falling: Secondary | ICD-10-CM | POA: Diagnosis not present

## 2015-06-29 DIAGNOSIS — M797 Fibromyalgia: Secondary | ICD-10-CM | POA: Diagnosis not present

## 2015-06-29 DIAGNOSIS — Z79891 Long term (current) use of opiate analgesic: Secondary | ICD-10-CM | POA: Diagnosis not present

## 2015-06-29 DIAGNOSIS — Z8673 Personal history of transient ischemic attack (TIA), and cerebral infarction without residual deficits: Secondary | ICD-10-CM | POA: Diagnosis not present

## 2015-06-29 DIAGNOSIS — Z7901 Long term (current) use of anticoagulants: Secondary | ICD-10-CM | POA: Diagnosis not present

## 2015-06-29 DIAGNOSIS — I10 Essential (primary) hypertension: Secondary | ICD-10-CM | POA: Diagnosis not present

## 2015-06-29 DIAGNOSIS — Z96651 Presence of right artificial knee joint: Secondary | ICD-10-CM | POA: Diagnosis not present

## 2015-06-29 DIAGNOSIS — Z471 Aftercare following joint replacement surgery: Secondary | ICD-10-CM | POA: Diagnosis not present

## 2015-07-06 DIAGNOSIS — M1711 Unilateral primary osteoarthritis, right knee: Secondary | ICD-10-CM | POA: Diagnosis not present

## 2015-07-06 DIAGNOSIS — M25561 Pain in right knee: Secondary | ICD-10-CM | POA: Diagnosis not present

## 2015-07-06 DIAGNOSIS — Z96651 Presence of right artificial knee joint: Secondary | ICD-10-CM | POA: Diagnosis not present

## 2015-07-13 DIAGNOSIS — R262 Difficulty in walking, not elsewhere classified: Secondary | ICD-10-CM | POA: Diagnosis not present

## 2015-07-13 DIAGNOSIS — M25561 Pain in right knee: Secondary | ICD-10-CM | POA: Diagnosis not present

## 2015-07-13 DIAGNOSIS — Z96651 Presence of right artificial knee joint: Secondary | ICD-10-CM | POA: Diagnosis not present

## 2015-07-16 DIAGNOSIS — R262 Difficulty in walking, not elsewhere classified: Secondary | ICD-10-CM | POA: Diagnosis not present

## 2015-07-16 DIAGNOSIS — Z96651 Presence of right artificial knee joint: Secondary | ICD-10-CM | POA: Diagnosis not present

## 2015-07-16 DIAGNOSIS — M25561 Pain in right knee: Secondary | ICD-10-CM | POA: Diagnosis not present

## 2015-07-17 DIAGNOSIS — R5383 Other fatigue: Secondary | ICD-10-CM | POA: Diagnosis not present

## 2015-07-17 DIAGNOSIS — Z79899 Other long term (current) drug therapy: Secondary | ICD-10-CM | POA: Diagnosis not present

## 2015-07-17 DIAGNOSIS — D508 Other iron deficiency anemias: Secondary | ICD-10-CM | POA: Diagnosis not present

## 2015-07-17 DIAGNOSIS — M1711 Unilateral primary osteoarthritis, right knee: Secondary | ICD-10-CM | POA: Diagnosis not present

## 2015-07-17 DIAGNOSIS — K5900 Constipation, unspecified: Secondary | ICD-10-CM | POA: Diagnosis not present

## 2015-07-21 DIAGNOSIS — Z96651 Presence of right artificial knee joint: Secondary | ICD-10-CM | POA: Diagnosis not present

## 2015-07-21 DIAGNOSIS — R262 Difficulty in walking, not elsewhere classified: Secondary | ICD-10-CM | POA: Diagnosis not present

## 2015-07-21 DIAGNOSIS — M25561 Pain in right knee: Secondary | ICD-10-CM | POA: Diagnosis not present

## 2015-07-27 DIAGNOSIS — R262 Difficulty in walking, not elsewhere classified: Secondary | ICD-10-CM | POA: Diagnosis not present

## 2015-07-27 DIAGNOSIS — M25561 Pain in right knee: Secondary | ICD-10-CM | POA: Diagnosis not present

## 2015-07-27 DIAGNOSIS — Z96651 Presence of right artificial knee joint: Secondary | ICD-10-CM | POA: Diagnosis not present

## 2015-07-31 DIAGNOSIS — Z96651 Presence of right artificial knee joint: Secondary | ICD-10-CM | POA: Diagnosis not present

## 2015-07-31 DIAGNOSIS — M25561 Pain in right knee: Secondary | ICD-10-CM | POA: Diagnosis not present

## 2015-07-31 DIAGNOSIS — R262 Difficulty in walking, not elsewhere classified: Secondary | ICD-10-CM | POA: Diagnosis not present

## 2015-08-07 DIAGNOSIS — Z96651 Presence of right artificial knee joint: Secondary | ICD-10-CM | POA: Diagnosis not present

## 2015-08-07 DIAGNOSIS — R262 Difficulty in walking, not elsewhere classified: Secondary | ICD-10-CM | POA: Diagnosis not present

## 2015-08-07 DIAGNOSIS — M25561 Pain in right knee: Secondary | ICD-10-CM | POA: Diagnosis not present

## 2015-08-10 DIAGNOSIS — M25561 Pain in right knee: Secondary | ICD-10-CM | POA: Diagnosis not present

## 2015-08-10 DIAGNOSIS — R262 Difficulty in walking, not elsewhere classified: Secondary | ICD-10-CM | POA: Diagnosis not present

## 2015-08-10 DIAGNOSIS — Z96651 Presence of right artificial knee joint: Secondary | ICD-10-CM | POA: Diagnosis not present

## 2015-08-24 DIAGNOSIS — I10 Essential (primary) hypertension: Secondary | ICD-10-CM | POA: Diagnosis not present

## 2015-08-24 DIAGNOSIS — M1711 Unilateral primary osteoarthritis, right knee: Secondary | ICD-10-CM | POA: Diagnosis not present

## 2015-08-24 DIAGNOSIS — R221 Localized swelling, mass and lump, neck: Secondary | ICD-10-CM | POA: Diagnosis not present

## 2015-08-24 DIAGNOSIS — E041 Nontoxic single thyroid nodule: Secondary | ICD-10-CM | POA: Diagnosis not present

## 2015-08-24 DIAGNOSIS — Z5181 Encounter for therapeutic drug level monitoring: Secondary | ICD-10-CM | POA: Diagnosis not present

## 2015-09-04 DIAGNOSIS — H2511 Age-related nuclear cataract, right eye: Secondary | ICD-10-CM | POA: Diagnosis not present

## 2015-09-04 DIAGNOSIS — H25011 Cortical age-related cataract, right eye: Secondary | ICD-10-CM | POA: Diagnosis not present

## 2015-09-04 DIAGNOSIS — H40023 Open angle with borderline findings, high risk, bilateral: Secondary | ICD-10-CM | POA: Diagnosis not present

## 2015-09-04 DIAGNOSIS — H2512 Age-related nuclear cataract, left eye: Secondary | ICD-10-CM | POA: Diagnosis not present

## 2015-09-16 DIAGNOSIS — M4120 Other idiopathic scoliosis, site unspecified: Secondary | ICD-10-CM | POA: Diagnosis not present

## 2015-09-16 DIAGNOSIS — M5126 Other intervertebral disc displacement, lumbar region: Secondary | ICD-10-CM | POA: Diagnosis not present

## 2015-09-16 DIAGNOSIS — Z6841 Body Mass Index (BMI) 40.0 and over, adult: Secondary | ICD-10-CM | POA: Diagnosis not present

## 2015-09-16 DIAGNOSIS — M5416 Radiculopathy, lumbar region: Secondary | ICD-10-CM | POA: Diagnosis not present

## 2015-09-16 DIAGNOSIS — M545 Low back pain: Secondary | ICD-10-CM | POA: Diagnosis not present

## 2015-09-18 DIAGNOSIS — F3341 Major depressive disorder, recurrent, in partial remission: Secondary | ICD-10-CM | POA: Diagnosis not present

## 2015-09-18 DIAGNOSIS — M1711 Unilateral primary osteoarthritis, right knee: Secondary | ICD-10-CM | POA: Diagnosis not present

## 2015-09-18 DIAGNOSIS — Z5181 Encounter for therapeutic drug level monitoring: Secondary | ICD-10-CM | POA: Diagnosis not present

## 2015-09-30 DIAGNOSIS — Z96651 Presence of right artificial knee joint: Secondary | ICD-10-CM | POA: Diagnosis not present

## 2015-09-30 DIAGNOSIS — Z471 Aftercare following joint replacement surgery: Secondary | ICD-10-CM | POA: Diagnosis not present

## 2015-10-21 DIAGNOSIS — M5137 Other intervertebral disc degeneration, lumbosacral region: Secondary | ICD-10-CM | POA: Diagnosis not present

## 2015-10-21 DIAGNOSIS — I10 Essential (primary) hypertension: Secondary | ICD-10-CM | POA: Diagnosis not present

## 2015-10-21 DIAGNOSIS — M797 Fibromyalgia: Secondary | ICD-10-CM | POA: Diagnosis not present

## 2015-10-21 DIAGNOSIS — R300 Dysuria: Secondary | ICD-10-CM | POA: Diagnosis not present

## 2015-10-21 DIAGNOSIS — Z79899 Other long term (current) drug therapy: Secondary | ICD-10-CM | POA: Diagnosis not present

## 2015-10-28 DIAGNOSIS — M5416 Radiculopathy, lumbar region: Secondary | ICD-10-CM | POA: Diagnosis not present

## 2015-10-28 DIAGNOSIS — M47816 Spondylosis without myelopathy or radiculopathy, lumbar region: Secondary | ICD-10-CM | POA: Diagnosis not present

## 2015-11-02 DIAGNOSIS — Z23 Encounter for immunization: Secondary | ICD-10-CM | POA: Diagnosis not present

## 2015-11-11 DIAGNOSIS — M545 Low back pain: Secondary | ICD-10-CM | POA: Diagnosis not present

## 2015-11-11 DIAGNOSIS — M4120 Other idiopathic scoliosis, site unspecified: Secondary | ICD-10-CM | POA: Diagnosis not present

## 2015-11-11 DIAGNOSIS — M5416 Radiculopathy, lumbar region: Secondary | ICD-10-CM | POA: Diagnosis not present

## 2015-11-11 DIAGNOSIS — M5126 Other intervertebral disc displacement, lumbar region: Secondary | ICD-10-CM | POA: Diagnosis not present

## 2015-11-17 DIAGNOSIS — D638 Anemia in other chronic diseases classified elsewhere: Secondary | ICD-10-CM | POA: Diagnosis not present

## 2015-11-17 DIAGNOSIS — Z5181 Encounter for therapeutic drug level monitoring: Secondary | ICD-10-CM | POA: Diagnosis not present

## 2015-11-17 DIAGNOSIS — M1711 Unilateral primary osteoarthritis, right knee: Secondary | ICD-10-CM | POA: Diagnosis not present

## 2015-11-17 DIAGNOSIS — F3341 Major depressive disorder, recurrent, in partial remission: Secondary | ICD-10-CM | POA: Diagnosis not present

## 2015-11-23 DIAGNOSIS — D508 Other iron deficiency anemias: Secondary | ICD-10-CM | POA: Diagnosis not present

## 2015-12-11 DIAGNOSIS — M545 Low back pain: Secondary | ICD-10-CM | POA: Diagnosis not present

## 2015-12-11 DIAGNOSIS — R2689 Other abnormalities of gait and mobility: Secondary | ICD-10-CM | POA: Diagnosis not present

## 2015-12-11 DIAGNOSIS — M6281 Muscle weakness (generalized): Secondary | ICD-10-CM | POA: Diagnosis not present

## 2015-12-14 DIAGNOSIS — M797 Fibromyalgia: Secondary | ICD-10-CM | POA: Diagnosis not present

## 2015-12-14 DIAGNOSIS — Z5181 Encounter for therapeutic drug level monitoring: Secondary | ICD-10-CM | POA: Diagnosis not present

## 2015-12-14 DIAGNOSIS — M1711 Unilateral primary osteoarthritis, right knee: Secondary | ICD-10-CM | POA: Diagnosis not present

## 2015-12-14 DIAGNOSIS — I10 Essential (primary) hypertension: Secondary | ICD-10-CM | POA: Diagnosis not present

## 2015-12-15 DIAGNOSIS — M48062 Spinal stenosis, lumbar region with neurogenic claudication: Secondary | ICD-10-CM | POA: Diagnosis not present

## 2015-12-15 DIAGNOSIS — M4120 Other idiopathic scoliosis, site unspecified: Secondary | ICD-10-CM | POA: Diagnosis not present

## 2015-12-23 DIAGNOSIS — R2689 Other abnormalities of gait and mobility: Secondary | ICD-10-CM | POA: Diagnosis not present

## 2015-12-23 DIAGNOSIS — M6281 Muscle weakness (generalized): Secondary | ICD-10-CM | POA: Diagnosis not present

## 2015-12-23 DIAGNOSIS — M545 Low back pain: Secondary | ICD-10-CM | POA: Diagnosis not present

## 2015-12-25 DIAGNOSIS — M545 Low back pain: Secondary | ICD-10-CM | POA: Diagnosis not present

## 2015-12-25 DIAGNOSIS — R2689 Other abnormalities of gait and mobility: Secondary | ICD-10-CM | POA: Diagnosis not present

## 2015-12-25 DIAGNOSIS — M6281 Muscle weakness (generalized): Secondary | ICD-10-CM | POA: Diagnosis not present

## 2016-01-18 DIAGNOSIS — I10 Essential (primary) hypertension: Secondary | ICD-10-CM | POA: Diagnosis not present

## 2016-01-18 DIAGNOSIS — D518 Other vitamin B12 deficiency anemias: Secondary | ICD-10-CM | POA: Diagnosis not present

## 2016-01-18 DIAGNOSIS — E782 Mixed hyperlipidemia: Secondary | ICD-10-CM | POA: Diagnosis not present

## 2016-01-18 DIAGNOSIS — E559 Vitamin D deficiency, unspecified: Secondary | ICD-10-CM | POA: Diagnosis not present

## 2016-01-18 DIAGNOSIS — E038 Other specified hypothyroidism: Secondary | ICD-10-CM | POA: Diagnosis not present

## 2016-01-18 DIAGNOSIS — E119 Type 2 diabetes mellitus without complications: Secondary | ICD-10-CM | POA: Diagnosis not present

## 2016-01-18 DIAGNOSIS — E041 Nontoxic single thyroid nodule: Secondary | ICD-10-CM | POA: Diagnosis not present

## 2016-01-18 DIAGNOSIS — Z5181 Encounter for therapeutic drug level monitoring: Secondary | ICD-10-CM | POA: Diagnosis not present

## 2016-01-19 DIAGNOSIS — Z6841 Body Mass Index (BMI) 40.0 and over, adult: Secondary | ICD-10-CM | POA: Diagnosis not present

## 2016-01-19 DIAGNOSIS — M545 Low back pain: Secondary | ICD-10-CM | POA: Diagnosis not present

## 2016-01-19 DIAGNOSIS — M48062 Spinal stenosis, lumbar region with neurogenic claudication: Secondary | ICD-10-CM | POA: Diagnosis not present

## 2016-02-02 DIAGNOSIS — E782 Mixed hyperlipidemia: Secondary | ICD-10-CM | POA: Diagnosis not present

## 2016-02-02 DIAGNOSIS — I10 Essential (primary) hypertension: Secondary | ICD-10-CM | POA: Diagnosis not present

## 2016-02-02 DIAGNOSIS — D638 Anemia in other chronic diseases classified elsewhere: Secondary | ICD-10-CM | POA: Diagnosis not present

## 2016-02-02 DIAGNOSIS — F3341 Major depressive disorder, recurrent, in partial remission: Secondary | ICD-10-CM | POA: Diagnosis not present

## 2016-02-15 DIAGNOSIS — M1711 Unilateral primary osteoarthritis, right knee: Secondary | ICD-10-CM | POA: Diagnosis not present

## 2016-02-15 DIAGNOSIS — R221 Localized swelling, mass and lump, neck: Secondary | ICD-10-CM | POA: Diagnosis not present

## 2016-02-15 DIAGNOSIS — M797 Fibromyalgia: Secondary | ICD-10-CM | POA: Diagnosis not present

## 2016-02-15 DIAGNOSIS — D638 Anemia in other chronic diseases classified elsewhere: Secondary | ICD-10-CM | POA: Diagnosis not present

## 2016-02-15 DIAGNOSIS — Z5181 Encounter for therapeutic drug level monitoring: Secondary | ICD-10-CM | POA: Diagnosis not present

## 2016-02-17 DIAGNOSIS — Z471 Aftercare following joint replacement surgery: Secondary | ICD-10-CM | POA: Diagnosis not present

## 2016-02-17 DIAGNOSIS — M1712 Unilateral primary osteoarthritis, left knee: Secondary | ICD-10-CM | POA: Diagnosis not present

## 2016-02-17 DIAGNOSIS — Z96651 Presence of right artificial knee joint: Secondary | ICD-10-CM | POA: Diagnosis not present

## 2016-03-02 DIAGNOSIS — M48062 Spinal stenosis, lumbar region with neurogenic claudication: Secondary | ICD-10-CM | POA: Diagnosis not present

## 2016-03-02 DIAGNOSIS — M545 Low back pain: Secondary | ICD-10-CM | POA: Diagnosis not present

## 2016-03-02 DIAGNOSIS — M4120 Other idiopathic scoliosis, site unspecified: Secondary | ICD-10-CM | POA: Diagnosis not present

## 2016-03-02 DIAGNOSIS — M5416 Radiculopathy, lumbar region: Secondary | ICD-10-CM | POA: Diagnosis not present

## 2016-03-17 DIAGNOSIS — E041 Nontoxic single thyroid nodule: Secondary | ICD-10-CM | POA: Diagnosis not present

## 2016-03-17 DIAGNOSIS — I10 Essential (primary) hypertension: Secondary | ICD-10-CM | POA: Diagnosis not present

## 2016-03-17 DIAGNOSIS — M797 Fibromyalgia: Secondary | ICD-10-CM | POA: Diagnosis not present

## 2016-03-17 DIAGNOSIS — D508 Other iron deficiency anemias: Secondary | ICD-10-CM | POA: Diagnosis not present

## 2016-03-17 DIAGNOSIS — Z5181 Encounter for therapeutic drug level monitoring: Secondary | ICD-10-CM | POA: Diagnosis not present

## 2016-03-24 DIAGNOSIS — M47816 Spondylosis without myelopathy or radiculopathy, lumbar region: Secondary | ICD-10-CM | POA: Diagnosis not present

## 2016-03-28 DIAGNOSIS — F3341 Major depressive disorder, recurrent, in partial remission: Secondary | ICD-10-CM | POA: Diagnosis not present

## 2016-03-28 DIAGNOSIS — E782 Mixed hyperlipidemia: Secondary | ICD-10-CM | POA: Diagnosis not present

## 2016-03-28 DIAGNOSIS — D638 Anemia in other chronic diseases classified elsewhere: Secondary | ICD-10-CM | POA: Diagnosis not present

## 2016-03-28 DIAGNOSIS — I10 Essential (primary) hypertension: Secondary | ICD-10-CM | POA: Diagnosis not present

## 2016-04-14 DIAGNOSIS — M1711 Unilateral primary osteoarthritis, right knee: Secondary | ICD-10-CM | POA: Diagnosis not present

## 2016-04-14 DIAGNOSIS — D638 Anemia in other chronic diseases classified elsewhere: Secondary | ICD-10-CM | POA: Diagnosis not present

## 2016-04-14 DIAGNOSIS — M797 Fibromyalgia: Secondary | ICD-10-CM | POA: Diagnosis not present

## 2016-04-14 DIAGNOSIS — Z5181 Encounter for therapeutic drug level monitoring: Secondary | ICD-10-CM | POA: Diagnosis not present

## 2016-04-14 DIAGNOSIS — M47816 Spondylosis without myelopathy or radiculopathy, lumbar region: Secondary | ICD-10-CM | POA: Diagnosis not present

## 2016-04-20 DIAGNOSIS — F3341 Major depressive disorder, recurrent, in partial remission: Secondary | ICD-10-CM | POA: Diagnosis not present

## 2016-04-20 DIAGNOSIS — I10 Essential (primary) hypertension: Secondary | ICD-10-CM | POA: Diagnosis not present

## 2016-04-20 DIAGNOSIS — D638 Anemia in other chronic diseases classified elsewhere: Secondary | ICD-10-CM | POA: Diagnosis not present

## 2016-04-20 DIAGNOSIS — E782 Mixed hyperlipidemia: Secondary | ICD-10-CM | POA: Diagnosis not present

## 2016-05-12 DIAGNOSIS — M1711 Unilateral primary osteoarthritis, right knee: Secondary | ICD-10-CM | POA: Diagnosis not present

## 2016-05-12 DIAGNOSIS — Z5181 Encounter for therapeutic drug level monitoring: Secondary | ICD-10-CM | POA: Diagnosis not present

## 2016-05-12 DIAGNOSIS — D638 Anemia in other chronic diseases classified elsewhere: Secondary | ICD-10-CM | POA: Diagnosis not present

## 2016-05-12 DIAGNOSIS — M797 Fibromyalgia: Secondary | ICD-10-CM | POA: Diagnosis not present

## 2016-05-18 DIAGNOSIS — D638 Anemia in other chronic diseases classified elsewhere: Secondary | ICD-10-CM | POA: Diagnosis not present

## 2016-05-18 DIAGNOSIS — F3341 Major depressive disorder, recurrent, in partial remission: Secondary | ICD-10-CM | POA: Diagnosis not present

## 2016-05-18 DIAGNOSIS — I10 Essential (primary) hypertension: Secondary | ICD-10-CM | POA: Diagnosis not present

## 2016-05-18 DIAGNOSIS — E782 Mixed hyperlipidemia: Secondary | ICD-10-CM | POA: Diagnosis not present

## 2016-05-19 DIAGNOSIS — M1712 Unilateral primary osteoarthritis, left knee: Secondary | ICD-10-CM | POA: Diagnosis not present

## 2016-06-10 DIAGNOSIS — K219 Gastro-esophageal reflux disease without esophagitis: Secondary | ICD-10-CM | POA: Diagnosis not present

## 2016-06-10 DIAGNOSIS — Z5181 Encounter for therapeutic drug level monitoring: Secondary | ICD-10-CM | POA: Diagnosis not present

## 2016-06-10 DIAGNOSIS — M1711 Unilateral primary osteoarthritis, right knee: Secondary | ICD-10-CM | POA: Diagnosis not present

## 2016-06-10 DIAGNOSIS — I10 Essential (primary) hypertension: Secondary | ICD-10-CM | POA: Diagnosis not present

## 2016-06-20 DIAGNOSIS — M5416 Radiculopathy, lumbar region: Secondary | ICD-10-CM | POA: Diagnosis not present

## 2016-06-20 DIAGNOSIS — M5136 Other intervertebral disc degeneration, lumbar region: Secondary | ICD-10-CM | POA: Diagnosis not present

## 2016-06-20 DIAGNOSIS — M4120 Other idiopathic scoliosis, site unspecified: Secondary | ICD-10-CM | POA: Diagnosis not present

## 2016-06-20 DIAGNOSIS — M48062 Spinal stenosis, lumbar region with neurogenic claudication: Secondary | ICD-10-CM | POA: Diagnosis not present

## 2016-06-24 DIAGNOSIS — I10 Essential (primary) hypertension: Secondary | ICD-10-CM | POA: Diagnosis not present

## 2016-06-24 DIAGNOSIS — E782 Mixed hyperlipidemia: Secondary | ICD-10-CM | POA: Diagnosis not present

## 2016-06-24 DIAGNOSIS — D638 Anemia in other chronic diseases classified elsewhere: Secondary | ICD-10-CM | POA: Diagnosis not present

## 2016-06-24 DIAGNOSIS — F3341 Major depressive disorder, recurrent, in partial remission: Secondary | ICD-10-CM | POA: Diagnosis not present

## 2016-07-07 ENCOUNTER — Other Ambulatory Visit: Payer: Self-pay | Admitting: Neurosurgery

## 2016-07-11 DIAGNOSIS — E041 Nontoxic single thyroid nodule: Secondary | ICD-10-CM | POA: Diagnosis not present

## 2016-07-11 DIAGNOSIS — M1711 Unilateral primary osteoarthritis, right knee: Secondary | ICD-10-CM | POA: Diagnosis not present

## 2016-07-11 DIAGNOSIS — Z5181 Encounter for therapeutic drug level monitoring: Secondary | ICD-10-CM | POA: Diagnosis not present

## 2016-07-11 DIAGNOSIS — M5136 Other intervertebral disc degeneration, lumbar region: Secondary | ICD-10-CM | POA: Diagnosis not present

## 2016-07-11 DIAGNOSIS — M5416 Radiculopathy, lumbar region: Secondary | ICD-10-CM | POA: Diagnosis not present

## 2016-07-11 DIAGNOSIS — E782 Mixed hyperlipidemia: Secondary | ICD-10-CM | POA: Diagnosis not present

## 2016-07-11 DIAGNOSIS — M48062 Spinal stenosis, lumbar region with neurogenic claudication: Secondary | ICD-10-CM | POA: Diagnosis not present

## 2016-07-26 ENCOUNTER — Encounter (HOSPITAL_COMMUNITY)
Admission: RE | Admit: 2016-07-26 | Discharge: 2016-07-26 | Disposition: A | Discharge status: Home / Self Care | Payer: PPO | Source: Ambulatory Visit | Attending: Neurosurgery | Admitting: Neurosurgery

## 2016-07-26 ENCOUNTER — Encounter (HOSPITAL_COMMUNITY): Payer: Self-pay

## 2016-07-26 DIAGNOSIS — F329 Major depressive disorder, single episode, unspecified: Secondary | ICD-10-CM | POA: Diagnosis not present

## 2016-07-26 DIAGNOSIS — R0602 Shortness of breath: Secondary | ICD-10-CM | POA: Diagnosis not present

## 2016-07-26 DIAGNOSIS — J9602 Acute respiratory failure with hypercapnia: Secondary | ICD-10-CM | POA: Diagnosis not present

## 2016-07-26 DIAGNOSIS — F418 Other specified anxiety disorders: Secondary | ICD-10-CM | POA: Diagnosis not present

## 2016-07-26 DIAGNOSIS — M4186 Other forms of scoliosis, lumbar region: Secondary | ICD-10-CM | POA: Diagnosis not present

## 2016-07-26 DIAGNOSIS — Z01812 Encounter for preprocedural laboratory examination: Secondary | ICD-10-CM | POA: Diagnosis not present

## 2016-07-26 DIAGNOSIS — J9600 Acute respiratory failure, unspecified whether with hypoxia or hypercapnia: Secondary | ICD-10-CM | POA: Diagnosis not present

## 2016-07-26 DIAGNOSIS — Z6841 Body Mass Index (BMI) 40.0 and over, adult: Secondary | ICD-10-CM | POA: Diagnosis not present

## 2016-07-26 DIAGNOSIS — E872 Acidosis: Secondary | ICD-10-CM | POA: Diagnosis not present

## 2016-07-26 DIAGNOSIS — Z79899 Other long term (current) drug therapy: Secondary | ICD-10-CM | POA: Diagnosis not present

## 2016-07-26 DIAGNOSIS — E7800 Pure hypercholesterolemia, unspecified: Secondary | ICD-10-CM | POA: Insufficient documentation

## 2016-07-26 DIAGNOSIS — Z7901 Long term (current) use of anticoagulants: Secondary | ICD-10-CM | POA: Insufficient documentation

## 2016-07-26 DIAGNOSIS — M5116 Intervertebral disc disorders with radiculopathy, lumbar region: Secondary | ICD-10-CM | POA: Diagnosis not present

## 2016-07-26 DIAGNOSIS — Z86711 Personal history of pulmonary embolism: Secondary | ICD-10-CM | POA: Diagnosis not present

## 2016-07-26 DIAGNOSIS — Z91013 Allergy to seafood: Secondary | ICD-10-CM | POA: Diagnosis not present

## 2016-07-26 DIAGNOSIS — K219 Gastro-esophageal reflux disease without esophagitis: Secondary | ICD-10-CM | POA: Insufficient documentation

## 2016-07-26 DIAGNOSIS — Z0183 Encounter for blood typing: Secondary | ICD-10-CM | POA: Insufficient documentation

## 2016-07-26 DIAGNOSIS — G9341 Metabolic encephalopathy: Secondary | ICD-10-CM | POA: Diagnosis not present

## 2016-07-26 DIAGNOSIS — Z96651 Presence of right artificial knee joint: Secondary | ICD-10-CM | POA: Diagnosis not present

## 2016-07-26 DIAGNOSIS — F419 Anxiety disorder, unspecified: Secondary | ICD-10-CM | POA: Insufficient documentation

## 2016-07-26 DIAGNOSIS — G4733 Obstructive sleep apnea (adult) (pediatric): Secondary | ICD-10-CM | POA: Diagnosis not present

## 2016-07-26 DIAGNOSIS — Z01818 Encounter for other preprocedural examination: Secondary | ICD-10-CM | POA: Insufficient documentation

## 2016-07-26 DIAGNOSIS — D62 Acute posthemorrhagic anemia: Secondary | ICD-10-CM | POA: Diagnosis not present

## 2016-07-26 DIAGNOSIS — Z91018 Allergy to other foods: Secondary | ICD-10-CM | POA: Diagnosis not present

## 2016-07-26 DIAGNOSIS — M549 Dorsalgia, unspecified: Secondary | ICD-10-CM | POA: Diagnosis present

## 2016-07-26 DIAGNOSIS — J95821 Acute postprocedural respiratory failure: Secondary | ICD-10-CM | POA: Diagnosis not present

## 2016-07-26 DIAGNOSIS — M797 Fibromyalgia: Secondary | ICD-10-CM | POA: Diagnosis not present

## 2016-07-26 DIAGNOSIS — M48062 Spinal stenosis, lumbar region with neurogenic claudication: Secondary | ICD-10-CM | POA: Diagnosis not present

## 2016-07-26 DIAGNOSIS — M5136 Other intervertebral disc degeneration, lumbar region: Secondary | ICD-10-CM | POA: Diagnosis not present

## 2016-07-26 DIAGNOSIS — N179 Acute kidney failure, unspecified: Secondary | ICD-10-CM | POA: Diagnosis not present

## 2016-07-26 DIAGNOSIS — M4326 Fusion of spine, lumbar region: Secondary | ICD-10-CM | POA: Diagnosis not present

## 2016-07-26 DIAGNOSIS — Z8673 Personal history of transient ischemic attack (TIA), and cerebral infarction without residual deficits: Secondary | ICD-10-CM | POA: Diagnosis not present

## 2016-07-26 DIAGNOSIS — I10 Essential (primary) hypertension: Secondary | ICD-10-CM | POA: Diagnosis not present

## 2016-07-26 DIAGNOSIS — M4726 Other spondylosis with radiculopathy, lumbar region: Secondary | ICD-10-CM | POA: Diagnosis not present

## 2016-07-26 DIAGNOSIS — M5416 Radiculopathy, lumbar region: Secondary | ICD-10-CM | POA: Diagnosis not present

## 2016-07-26 HISTORY — DX: Personal history of urinary calculi: Z87.442

## 2016-07-26 LAB — BASIC METABOLIC PANEL
Anion gap: 8 (ref 5–15)
BUN: 13 mg/dL (ref 6–20)
CO2: 26 mmol/L (ref 22–32)
Calcium: 9.2 mg/dL (ref 8.9–10.3)
Chloride: 104 mmol/L (ref 101–111)
Creatinine, Ser: 1.23 mg/dL — ABNORMAL HIGH (ref 0.44–1.00)
GFR calc Af Amer: 55 mL/min — ABNORMAL LOW (ref >60)
GFR calc non Af Amer: 47 mL/min — ABNORMAL LOW (ref >60)
Glucose, Bld: 105 mg/dL — ABNORMAL HIGH (ref 65–99)
Potassium: 4.2 mmol/L (ref 3.5–5.1)
Sodium: 138 mmol/L (ref 135–145)

## 2016-07-26 LAB — CBC
HCT: 24.5 % — ABNORMAL LOW (ref 36.0–46.0)
Hemoglobin: 7.7 g/dL — ABNORMAL LOW (ref 12.0–15.0)
MCH: 34.8 pg — ABNORMAL HIGH (ref 26.0–34.0)
MCHC: 31.4 g/dL (ref 30.0–36.0)
MCV: 110.9 fL — ABNORMAL HIGH (ref 78.0–100.0)
Platelets: 365 10*3/uL (ref 150–400)
RBC: 2.21 MIL/uL — ABNORMAL LOW (ref 3.87–5.11)
RDW: 22.2 % — ABNORMAL HIGH (ref 11.5–15.5)
WBC: 6.2 10*3/uL (ref 4.0–10.5)

## 2016-07-26 LAB — PROTIME-INR
INR: 1.34
Prothrombin Time: 16.7 seconds — ABNORMAL HIGH (ref 11.4–15.2)

## 2016-07-26 LAB — SURGICAL PCR SCREEN
MRSA, PCR: NEGATIVE
Staphylococcus aureus: NEGATIVE

## 2016-07-26 LAB — APTT: aPTT: 35 seconds (ref 24–36)

## 2016-07-26 MED ORDER — CHLORHEXIDINE GLUCONATE CLOTH 2 % EX PADS: 6.0000 | MEDICATED_PAD | Freq: Once | CUTANEOUS | Status: DC

## 2016-07-26 NOTE — Progress Notes (Addendum)
PCP: Raelyn Number, MD  Cardiologist: pt denies  EKG: pt denies past year  Stress test: 5+ years ago per pt  ECHO: pt denies ever  Cardiac Cath: pt denies ever  Chest x-ray: pt denies past

## 2016-07-26 NOTE — Pre-Procedure Instructions (Signed)
[name]  07/26/2016      [facility] - [place], [place] [address] Phone: [phone] Fax: [place], [place] [address] Phone: [phone] Fax: [phone]    Your procedure is scheduled on August 02, 2016.  Report to [facility] Admitting at 530 AM.  Call this number if you have problems the morning of surgery:  [phone]   Remember:  Do not eat food or drink liquids after midnight.  Take these medicines the morning of surgery with A SIP OF WATER allopurinol (zyloprim), aripiprazole (abilify), bupropion (wellbutrin), esomeprazole (nexium), gabapentin (neurontin), hydrocodone (norco)-if needed, eye drops-if needed, sertraline (zoloft).  Stop taking Xaralto as instructed by your surgeon. (5-7 days prior to surgery)  Take atenolol (tenormin) the night before surgery as usual  7 days prior to surgery STOP taking any Aspirin, Aleve, Naproxen, Ibuprofen, Motrin, Advil, Goody's, BC's, all herbal medications, fish oil, and all vitamins   Do not wear jewelry, make-up or nail polish.  Do not wear lotions, powders, or perfumes, or deoderant.  Do not shave 48 hours prior to surgery.    Do not bring valuables to the hospital.  [facility] is not responsible for any belongings or valuables.  Contacts, dentures or bridgework may not be worn into surgery.  Leave your suitcase in the car.  After surgery it may be brought to your room.  For patients admitted to the hospital, discharge time will be determined by your treatment team.  Patients discharged the day of surgery will not be allowed to drive home.   Special instructions:   [facility]- Preparing For Surgery  Before surgery, you can play an important role. Because skin is not sterile, your skin needs to be as free of germs as possible. You can reduce the  number of germs on your skin by washing with CHG (chlorahexidine gluconate) Soap before surgery.  CHG is an antiseptic cleaner which kills germs and bonds with the skin to continue killing germs even after washing.  Please do not use if you have an allergy to CHG or antibacterial soaps. If your skin becomes reddened/irritated stop using the CHG.  Do not shave (including legs and underarms) for at least 48 hours prior to first CHG shower. It is OK to shave your face.  Please follow these instructions carefully.   1. Shower the NIGHT BEFORE SURGERY and the MORNING OF SURGERY with CHG.   2. If you chose to wash your hair, wash your hair first as usual with your normal shampoo.  3. After you shampoo, rinse your hair and body thoroughly to remove the shampoo.  4. Use CHG as you would any other liquid soap. You can apply CHG directly to the skin and wash gently with a scrungie or a clean washcloth.   5. Apply the CHG Soap to your body ONLY FROM THE NECK DOWN.  Do not use on open wounds or open sores. Avoid contact with your eyes, ears, mouth and genitals (private parts). Wash genitals (private parts) with your normal soap.  6. Wash thoroughly, paying special attention to the area where your surgery will be performed.  7. Thoroughly rinse your body with warm water from the neck down.  8. DO NOT shower/wash with your normal soap after using and rinsing off the CHG Soap.  9. Pat yourself dry  with a CLEAN TOWEL.   10. Wear CLEAN PAJAMAS   11. Place CLEAN SHEETS on your bed the night of your first shower and DO NOT SLEEP WITH PETS.    Day of Surgery: Do not apply any deodorants/lotions. Please wear clean clothes to the hospital/surgery center.     Please read over the following fact sheets that you were given. Pain Booklet, Coughing and Deep Breathing, MRSA Information and Surgical Site Infection Prevention

## 2016-07-28 ENCOUNTER — Encounter (HOSPITAL_COMMUNITY): Payer: Self-pay

## 2016-07-28 NOTE — H&P (Signed)
Patient ID:   [id number] Patient: [name]  Date of Birth: May 15, 1955 Visit Type: Office Visit   Date: 06/20/2016 10:15 AM Provider: Marchia Meiers. Vertell Limber MD   This 61 year old female presents for back pain.   History of Present Illness: 1.  back pain  04/14/2016 MBB bilateral L2-3, L3-4 03/24/2016 MBB bilateral L2-3, L3-4  Patient returns, reporting only 1 day of pain relief following each injection.  Currently she reports lumbar pain, radiating to her buttocks and legs only with sitting long periods.  Buttock pain dissipates upon standing and walking.  Pain medication as listed below from PCP for longstanding fibromyalgia pain:     Norco 10/325 q.i.d.     Gabapentin 600 milligrams t.i.d.  Xarelto continues  History of discectomy to L2-3 in December 2010 by Dr. Alisa Graff. Patient reports she did not experience pain radiating to the legs at the time of that procedure.           MEDICATIONS(added, continued or stopped this visit): Started Medication Directions Instruction Stopped   Abilify 5 mg tablet take 1 tablet by oral route  every day     allopurinol 100 mg tablet take 1 tablet by oral route  every day     atenolol 25 mg tablet take 1 tablet by oral route  every day     bupropion HCl XL 300 mg 24 hr tablet, extended release take 1 tablet by oral route  every day     furosemide 20 mg tablet take 1 tablet by oral route  every day     gabapentin 600 mg tablet take 1 tablet by oral route 3 times every day     hydrocodone 10 mg-acetaminophen 325 mg tablet take 1 tablet by oral route  every 6 hours as needed for pain     simvastatin 20 mg tablet take 1 tablet by oral route  every day in the evening     sumatriptan 25 mg tablet take 1 tablet by oral route after onset of migraine; may repeat after 2 hours if headache returns,not to exceed 200mg  in 24hrs     Xarelto 20 mg tablet take 1 tablet by oral route  every day with the evening meal     Zoloft 100 mg tablet take 1 1/2  tablet by oral route  every day       ALLERGIES: Ingredient Reaction Medication Name Comment  PINEAPPLE     [place] Date Temp F BP Pulse Ht In Wt Lb BMI BSA Pain Score  06/20/2016  129/69 84 63 260 46.06  6/10      IMPRESSION X-ray shows degeneration at L2-3, mild degeneration at L1-2.  Comments:  Pending cardiac clearance, patient will need to be off Xarelto prior to surgery  Assessment/Plan # Detail Type Description   1. Assessment Degenerative disc disease, lumbar (M51.36).       2. Assessment Scoliosis (and kyphoscoliosis), idiopathic (M41.20).       3. Assessment Lumbar radiculopathy (M54.16).       4. 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           Pain Management Plan Pain Scale: 6/10. Method: Numeric Pain Intensity Scale. Location: lower back. Onset: 09/03/2013. Pain management follow-up plan of care: Patient taking medication as prescribed..  Fall Risk Plan The patient has not fallen in the last year.  Schedule MRI of the lumbar spine. Schedule L2-3 XLIF with lateral plate. Nurse  education given. Patient advised to stop Xarelto.  Orders: Diagnostic Procedures: Assessment Procedure  M54.16 Lumbar Spine- AP/Lat             Provider:  Vertell Limber MD, Marchia Meiers 06/20/2016 11:45 AM  Dictation edited by: Lucita Lora    CC Providers: Cher Nakai [facility] [address] [place],  [place]  [address]   Erline Levine MD  [address] [place], [place] [address]              Electronically signed by Marchia Meiers. Vertell Limber MD on 06/24/2016 07:14 PM  Patient ID:   [id number] Patient: [name]  Date of Birth: 30-Mar-1955 Visit Type: Office Visit   Date: 03/02/2016 01:30 PM Provider: Marchia Meiers. Vertell Limber MD   This 61 year old female presents for back pain.  History of Present Illness: 1.  back pain    12/18/16 - 12/20/16 Two L3-4 ESIs with Dr. Maryjean Ka  The  patient comes in post-injections, with continued and persistent low back pain.  She denies any pain in her legs.  ESIs offered one day of relief and PT made it worse.  Today, her pain is a 7/10.  On Xarelto for three PEs.      Medical/Surgical/Interim History Reviewed, no change.  Last detailed document date:09/16/2015.   Family History: Reviewed, no changes.  Last detailed document: 09/16/2015.   Social History: Tobacco use reviewed. Reviewed, no changes. Last detailed document date: 09/16/2015.      MEDICATIONS(added, continued or stopped this visit): Started Medication Directions Instruction Stopped   Abilify 5 mg tablet take 1 tablet by oral route  every day     allopurinol 100 mg tablet take 1 tablet by oral route  every day     atenolol 25 mg tablet take 1 tablet by oral route  every day     bupropion HCl XL 300 mg 24 hr tablet, extended release take 1 tablet by oral route  every day     furosemide 20 mg tablet take 1 tablet by oral route  every day     gabapentin 600 mg tablet take 1 tablet by oral route 3 times every day     hydrocodone 10 mg-acetaminophen 325 mg tablet take 1 tablet by oral route  every 6 hours as needed for pain     simvastatin 20 mg tablet take 1 tablet by oral route  every day in the evening     sumatriptan 25 mg tablet take 1 tablet by oral route after onset of migraine; may repeat after 2 hours if headache returns,not to exceed 200mg  in 24hrs     Xarelto 20 mg tablet take 1 tablet by oral route  every day with the evening meal     Zoloft 100 mg tablet take 1 1/2 tablet by oral route  every day       ALLERGIES: Ingredient Reaction Medication Name Comment  Gregory      Reviewed, no changes.    Vitals Date Temp F BP Pulse Ht In Wt Lb BMI BSA Pain Score  03/02/2016  135/68 82 63 251.6 49.14  6/10      IMPRESSION The patient comes in with persistent low back pain.  She reports recent injections provided only a day  of relief and that PT in the past exacerbated her symptoms. Her old MRI shows loss of normal curvature of her spine, severe arthritis and disc degeneration at L2-3.  PT worsened  her condition in the past so we discussed surgical intervention and diagnostic/therapeutic medial branch blocks.  She expressed interest in the injections and will need to come off of xarelto.  Scheduled two sets of blocks and if positive we will schedule RF ablation.  Follow up with me after to discuss.  Completed Orders (this encounter) Order Details Reason Side Interpretation Result Initial Treatment Date Region  Lifestyle education Patient to follow up with primary care provider.        Lifestyle education regarding diet patient encouraged to a well balanced diet         Assessment/Plan # Detail Type Description   1. Assessment Essential (primary) hypertension (I10).       2. Assessment Body mass index (BMI) 45.0-49.9, adult (Z68.42).   Plan Orders Today's instructions / counseling include(s) Lifestyle education regarding diet.         Pain Assessment/Treatment Pain Scale: 6/10. Method: Numeric Pain Intensity Scale. Location: back. Onset: 09/03/2013. Duration: varies. Quality: discomforting. Pain Assessment/Treatment follow-up plan of care: Patient taking medication as prescribed..  Fall Risk Plan The patient has not fallen in the last year.  Scheduled two sets of blocks and will schedule RF ablation if she gets appropriate degree of relief.  Follow up with me after to discuss.  Orders: Instruction(s)/Education: Assessment Instruction  I10 Lifestyle education  [phone] Lifestyle education regarding diet             Provider:  Marchia Meiers. Vertell Limber MD  03/02/2016 02:30 PM Dictation edited by: Daine Gravel    CC Providers: Cher Nakai [facility] [address] [place],  [place]  [address]   [place] [address] [place], [place]  [address]              Electronically signed by Marchia Meiers. Vertell Limber MD on 03/05/2016 03:14 PM

## 2016-07-28 NOTE — Progress Notes (Addendum)
Anesthesia Chart Review: Patient is a 61 year old female scheduled for L2-3 anterolateral lumbar interbody fusion with lateral plate on 29/79/8921 by Dr. Vertell Limber.  History includes HTN, PE 01/2008, stroke (03/2014), anemia, thyroid tumor ("benign"), Bells Palsy, fibromyalgia, anxiety, depression, GERD, hypercholesterolemia, migraines, parotid tumor excision ("benign"), endometrial ablation, L2-3 laminectomy/discectomy 12/11/08 with redo 01/29/09, right TKA 06/22/15. Never smoker. BMI 45.76 consistent with morbid obesity.   - PCP is Valaria Good, PA-C/Dr. Celedonio Miyamoto. Ankita signed a note of medical clearance that indicates patient with be holding Xarelto for five days prior to surgery.  - She is not routinely followed by cardiology, but was seen by Dr. Vivi Martens ([facility]) 11/19/08 for preoperative evaluation. She also had cardiac testing in 2016.  Medications include Allopurinol, Abilify, atenolol, Wellbutrin XL, Nexium, Lasix, midodrine, Neurontin, Norco, midodrine, Zoloft, Zocor, Xarelto.   BP (!) 144/58   Pulse 86   Temp 36.8 C (Oral)   Resp 16   Ht 5\' 3"  (1.6 m)   Wt 258 lb 4.8 oz (117.2 kg)   SpO2 100%   BMI 45.76 kg/m   EKG 07/26/16: NSR.  Nuclear stress test 07/15/14 [facility]; scanned under Media tab, Correspondence 06/22/15): No evidence of ischemia or scar. Normal wall motion and contractility. EF 64%  Echo 03/14/14 [facility]; scanned under Media tab, Correspondence 06/22/15): 1. Technically difficult study with suboptimal views 2. Mild concentric LVH 3. Overall LV systolic function normal, EF 55-60% 4. Mild MR 5. Mild TR  Preoperative labs noted. Cr 1.23. BUN 13. H/H 7.7/24.5. PT 16.7, INR 1.34. PTT 35. T&S done. A1c 5.8 01/19/16 at PCP office. She has known anemia, but prior to 06/2015 surgery, HGB was in the 9-10 range. I notified Jessica at Dr. Melven Sartorius office of H/H. I called and spoke with Valaria Good, PA-C. She has been seeing patient  since around late 2016. Patient's HGB stays ~ 9.0 range. She has periodically received transfusions for symptomatic anemia, last ~ 12/2016. Since this is a chronic issue, she did not feel inclined that patient needed to be re-evaluated, but may benefit from transfusion prior to surgery--she will defer to surgeon. Of note, patient recently had to be started on midodrine 06/2016, but CBC was not done at that time because last H/H on 05/13/16 were stable at 9.3/29.8.    I called and spoke with patient. She denied any known obvious bleeding (ie, hematochezia). She reports "normal" colonoscopies with Dr. Henrene Pastor every five years and believes her last one was ~ 4 years ago. She was also evaluated by hematologist Dr. Bobby Rumpf (~ 2-3 years ago) for anemia and was on Procrit, but it no longer on because of expenses. She also did not see a great jump in her numbers. Her last iron studies and B12 levers were normal (05/13/16). She has been off iron and B12 for ~ a year. She has chronic dizziness for the last few years felt at least partially due to her anemia. She has dyspnea on exertion which has worsened a little over the past year, which she has attributed to weight gain due to inactivity due to her back pain. She denied chest pain and syncope. Discussed that she will likely need a transfusion prior to or on the day of surgery.   I discussed above with anesthesiologist Dr. Orene Desanctis. From an anesthesia standpoint, she should have blood available to be given intraoperatively as indicated. If Dr. Vertell Limber wanted patient to have a transfusion prior to the day of surgery then he would  need to arrange. I have left a voice message with Janett Billow at Dr. Melven Sartorius office regarding this. I also notified Blood Bank staff who advised to have order available for release on patient's arrival--prepare 2 units PRBC entered, but defer transfusion order to surgeon and/or anesthesiologist. (Update 07/29/16 1:59 PM: Per Janett Billow, Dr. Vertell Limber is in agreement  just to have 2 Units PRBC available for intraoperative transfusion if felt indicated.)  George Hugh [facility] Short Stay Center/Anesthesiology Phone [phone] 07/28/2016 4:38 PM

## 2016-08-02 ENCOUNTER — Inpatient Hospital Stay (HOSPITAL_COMMUNITY): Payer: PPO | Admitting: Vascular Surgery

## 2016-08-02 ENCOUNTER — Encounter (HOSPITAL_COMMUNITY)
Admission: RE | Disposition: A | Discharge status: Home / Self Care | Payer: Self-pay | Source: Ambulatory Visit | Attending: Neurosurgery

## 2016-08-02 ENCOUNTER — Inpatient Hospital Stay (HOSPITAL_COMMUNITY)
Admission: RE | Admit: 2016-08-02 | Discharge: 2016-08-07 | DRG: 459 | Disposition: A | Discharge status: Home / Self Care | Payer: PPO | Source: Ambulatory Visit | Attending: Neurosurgery | Admitting: Neurosurgery

## 2016-08-02 ENCOUNTER — Inpatient Hospital Stay (HOSPITAL_COMMUNITY): Payer: PPO

## 2016-08-02 DIAGNOSIS — E872 Acidosis: Secondary | ICD-10-CM | POA: Diagnosis present

## 2016-08-02 DIAGNOSIS — Z91018 Allergy to other foods: Secondary | ICD-10-CM

## 2016-08-02 DIAGNOSIS — M4326 Fusion of spine, lumbar region: Secondary | ICD-10-CM | POA: Diagnosis not present

## 2016-08-02 DIAGNOSIS — N179 Acute kidney failure, unspecified: Secondary | ICD-10-CM | POA: Diagnosis not present

## 2016-08-02 DIAGNOSIS — M5416 Radiculopathy, lumbar region: Secondary | ICD-10-CM | POA: Diagnosis present

## 2016-08-02 DIAGNOSIS — J9600 Acute respiratory failure, unspecified whether with hypoxia or hypercapnia: Secondary | ICD-10-CM

## 2016-08-02 DIAGNOSIS — Z7901 Long term (current) use of anticoagulants: Secondary | ICD-10-CM | POA: Diagnosis not present

## 2016-08-02 DIAGNOSIS — G9341 Metabolic encephalopathy: Secondary | ICD-10-CM | POA: Diagnosis not present

## 2016-08-02 DIAGNOSIS — J9602 Acute respiratory failure with hypercapnia: Secondary | ICD-10-CM | POA: Diagnosis not present

## 2016-08-02 DIAGNOSIS — Z6841 Body Mass Index (BMI) 40.0 and over, adult: Secondary | ICD-10-CM

## 2016-08-02 DIAGNOSIS — Z91013 Allergy to seafood: Secondary | ICD-10-CM

## 2016-08-02 DIAGNOSIS — M5136 Other intervertebral disc degeneration, lumbar region: Secondary | ICD-10-CM | POA: Diagnosis present

## 2016-08-02 DIAGNOSIS — J95821 Acute postprocedural respiratory failure: Secondary | ICD-10-CM | POA: Diagnosis not present

## 2016-08-02 DIAGNOSIS — Z79899 Other long term (current) drug therapy: Secondary | ICD-10-CM | POA: Diagnosis not present

## 2016-08-02 DIAGNOSIS — M48062 Spinal stenosis, lumbar region with neurogenic claudication: Secondary | ICD-10-CM | POA: Diagnosis present

## 2016-08-02 DIAGNOSIS — M419 Scoliosis, unspecified: Secondary | ICD-10-CM | POA: Diagnosis present

## 2016-08-02 DIAGNOSIS — D62 Acute posthemorrhagic anemia: Secondary | ICD-10-CM | POA: Diagnosis not present

## 2016-08-02 DIAGNOSIS — M797 Fibromyalgia: Secondary | ICD-10-CM | POA: Diagnosis present

## 2016-08-02 DIAGNOSIS — G4733 Obstructive sleep apnea (adult) (pediatric): Secondary | ICD-10-CM | POA: Diagnosis present

## 2016-08-02 DIAGNOSIS — Z419 Encounter for procedure for purposes other than remedying health state, unspecified: Secondary | ICD-10-CM

## 2016-08-02 DIAGNOSIS — M549 Dorsalgia, unspecified: Secondary | ICD-10-CM | POA: Diagnosis present

## 2016-08-02 HISTORY — PX: ANTERIOR LAT LUMBAR FUSION: SHX1168

## 2016-08-02 LAB — PREPARE RBC (CROSSMATCH)

## 2016-08-02 SURGERY — ANTERIOR LATERAL LUMBAR FUSION 1 LEVEL
Anesthesia: General | Site: Flank

## 2016-08-02 MED ORDER — PROPOFOL 500 MG/50ML IV EMUL
INTRAVENOUS | Status: DC | PRN
  Administered 2016-08-02: 20 ug/kg/min via INTRAVENOUS

## 2016-08-02 MED ORDER — BUPROPION HCL ER (XL) 150 MG PO TB24
300.0000 mg | ORAL_TABLET | Freq: Every day | ORAL | Status: DC
  Administered 2016-08-02 – 2016-08-07 (×6): 300 mg via ORAL
  Filled 2016-08-02: qty 2
  Filled 2016-08-02: qty 1
  Filled 2016-08-02 (×2): qty 2
  Filled 2016-08-02: qty 1
  Filled 2016-08-02: qty 2

## 2016-08-02 MED ORDER — ONDANSETRON HCL 4 MG PO TABS
4.0000 mg | ORAL_TABLET | Freq: Four times a day (QID) | ORAL | Status: DC | PRN
  Administered 2016-08-02: 4 mg via ORAL
  Filled 2016-08-02: qty 1

## 2016-08-02 MED ORDER — BISACODYL 10 MG RE SUPP: 10.0000 mg | Freq: Every day | RECTAL | Status: DC | PRN

## 2016-08-02 MED ORDER — ACETAMINOPHEN 650 MG RE SUPP: 650.0000 mg | RECTAL | Status: DC | PRN

## 2016-08-02 MED ORDER — LIDOCAINE-EPINEPHRINE 1 %-1:100000 IJ SOLN
INTRAMUSCULAR | Status: AC
  Filled 2016-08-02: qty 1

## 2016-08-02 MED ORDER — PHENYLEPHRINE HCL 10 MG/ML IJ SOLN
INTRAVENOUS | Status: DC | PRN
  Administered 2016-08-02: 25 ug/min via INTRAVENOUS

## 2016-08-02 MED ORDER — THROMBIN 5000 UNITS EX SOLR
CUTANEOUS | Status: AC
  Filled 2016-08-02: qty 5000

## 2016-08-02 MED ORDER — HYDROCODONE-ACETAMINOPHEN 5-325 MG PO TABS: 1.0000 | ORAL_TABLET | ORAL | Status: DC | PRN

## 2016-08-02 MED ORDER — BUPIVACAINE HCL (PF) 0.5 % IJ SOLN
INTRAMUSCULAR | Status: AC
  Filled 2016-08-02: qty 30

## 2016-08-02 MED ORDER — ONDANSETRON HCL 4 MG/2ML IJ SOLN
INTRAMUSCULAR | Status: DC | PRN
  Administered 2016-08-02: 4 mg via INTRAVENOUS

## 2016-08-02 MED ORDER — PANTOPRAZOLE SODIUM 40 MG PO TBEC
40.0000 mg | DELAYED_RELEASE_TABLET | Freq: Every day | ORAL | Status: DC
  Administered 2016-08-03 – 2016-08-07 (×5): 40 mg via ORAL
  Filled 2016-08-02 (×5): qty 1

## 2016-08-02 MED ORDER — ALBUMIN HUMAN 5 % IV SOLN
INTRAVENOUS | Status: DC | PRN
  Administered 2016-08-02: 08:00:00 via INTRAVENOUS

## 2016-08-02 MED ORDER — OXYCODONE HCL 5 MG/5ML PO SOLN: 5.0000 mg | Freq: Once | ORAL | Status: DC | PRN

## 2016-08-02 MED ORDER — SENNOSIDES-DOCUSATE SODIUM 8.6-50 MG PO TABS: 1.0000 | ORAL_TABLET | Freq: Every evening | ORAL | Status: DC | PRN

## 2016-08-02 MED ORDER — MORPHINE SULFATE (PF) 4 MG/ML IV SOLN
2.0000 mg | INTRAVENOUS | Status: DC | PRN
  Administered 2016-08-02: 2 mg via INTRAVENOUS
  Filled 2016-08-02: qty 1

## 2016-08-02 MED ORDER — WHITE PETROLATUM GEL: 1.0000 "application " | Freq: Every day | Status: DC

## 2016-08-02 MED ORDER — MIDODRINE HCL 5 MG PO TABS
2.5000 mg | ORAL_TABLET | Freq: Every day | ORAL | Status: DC
  Administered 2016-08-02 – 2016-08-06 (×5): 2.5 mg via ORAL
  Filled 2016-08-02 (×6): qty 1

## 2016-08-02 MED ORDER — MENTHOL 3 MG MT LOZG: 1.0000 | LOZENGE | OROMUCOSAL | Status: DC | PRN

## 2016-08-02 MED ORDER — MIDAZOLAM HCL 5 MG/5ML IJ SOLN
INTRAMUSCULAR | Status: DC | PRN
  Administered 2016-08-02: 2 mg via INTRAVENOUS

## 2016-08-02 MED ORDER — ALLOPURINOL 100 MG PO TABS
200.0000 mg | ORAL_TABLET | Freq: Every day | ORAL | Status: DC
  Administered 2016-08-02 – 2016-08-07 (×6): 200 mg via ORAL
  Filled 2016-08-02 (×6): qty 2

## 2016-08-02 MED ORDER — FUROSEMIDE 20 MG PO TABS: 20.0000 mg | ORAL_TABLET | Freq: Every day | ORAL | Status: DC | PRN

## 2016-08-02 MED ORDER — PHENOL 1.4 % MT LIQD: 1.0000 | OROMUCOSAL | Status: DC | PRN

## 2016-08-02 MED ORDER — THROMBIN 5000 UNITS EX SOLR
CUTANEOUS | Status: DC | PRN
  Administered 2016-08-02 (×2): 5000 [IU] via TOPICAL

## 2016-08-02 MED ORDER — IPRATROPIUM-ALBUTEROL 0.5-2.5 (3) MG/3ML IN SOLN
RESPIRATORY_TRACT | Status: AC
  Filled 2016-08-02: qty 3

## 2016-08-02 MED ORDER — 0.9 % SODIUM CHLORIDE (POUR BTL) OPTIME
TOPICAL | Status: DC | PRN
  Administered 2016-08-02: 1000 mL

## 2016-08-02 MED ORDER — DOCUSATE SODIUM 100 MG PO CAPS
100.0000 mg | ORAL_CAPSULE | Freq: Two times a day (BID) | ORAL | Status: DC
  Administered 2016-08-02 – 2016-08-05 (×5): 100 mg via ORAL
  Filled 2016-08-02 (×9): qty 1

## 2016-08-02 MED ORDER — SIMVASTATIN 20 MG PO TABS
20.0000 mg | ORAL_TABLET | Freq: Every evening | ORAL | Status: DC
  Administered 2016-08-02 – 2016-08-06 (×5): 20 mg via ORAL
  Filled 2016-08-02 (×5): qty 1

## 2016-08-02 MED ORDER — HEMOSTATIC AGENTS (NO CHARGE) OPTIME
TOPICAL | Status: DC | PRN
  Administered 2016-08-02: 1 via TOPICAL

## 2016-08-02 MED ORDER — DEXAMETHASONE SODIUM PHOSPHATE 10 MG/ML IJ SOLN
INTRAMUSCULAR | Status: DC | PRN
  Administered 2016-08-02: 10 mg via INTRAVENOUS

## 2016-08-02 MED ORDER — HYDROMORPHONE HCL 1 MG/ML IJ SOLN
INTRAMUSCULAR | Status: AC
  Filled 2016-08-02: qty 1

## 2016-08-02 MED ORDER — SUCCINYLCHOLINE CHLORIDE 20 MG/ML IJ SOLN
INTRAMUSCULAR | Status: DC | PRN
  Administered 2016-08-02: 100 mg via INTRAVENOUS

## 2016-08-02 MED ORDER — SODIUM CHLORIDE 0.9% FLUSH: 3.0000 mL | INTRAVENOUS | Status: DC | PRN

## 2016-08-02 MED ORDER — OXYCODONE HCL 5 MG PO TABS: 5.0000 mg | ORAL_TABLET | Freq: Once | ORAL | Status: DC | PRN

## 2016-08-02 MED ORDER — ZOLPIDEM TARTRATE 5 MG PO TABS: 5.0000 mg | ORAL_TABLET | Freq: Every evening | ORAL | Status: DC | PRN

## 2016-08-02 MED ORDER — LACTATED RINGERS IV SOLN
INTRAVENOUS | Status: DC | PRN
  Administered 2016-08-02: 07:00:00 via INTRAVENOUS

## 2016-08-02 MED ORDER — BUPIVACAINE HCL (PF) 0.5 % IJ SOLN
INTRAMUSCULAR | Status: DC | PRN
  Administered 2016-08-02: 5 mL

## 2016-08-02 MED ORDER — LIDOCAINE HCL (CARDIAC) 20 MG/ML IV SOLN
INTRAVENOUS | Status: DC | PRN
  Administered 2016-08-02: 100 mg via INTRAVENOUS

## 2016-08-02 MED ORDER — ARIPIPRAZOLE 10 MG PO TABS
10.0000 mg | ORAL_TABLET | Freq: Every day | ORAL | Status: DC
  Administered 2016-08-03 – 2016-08-07 (×4): 10 mg via ORAL
  Filled 2016-08-02 (×6): qty 1

## 2016-08-02 MED ORDER — OXYCODONE HCL 5 MG PO TABS
5.0000 mg | ORAL_TABLET | ORAL | Status: DC | PRN
  Administered 2016-08-02 – 2016-08-03 (×4): 10 mg via ORAL
  Filled 2016-08-02 (×4): qty 2

## 2016-08-02 MED ORDER — GABAPENTIN 600 MG PO TABS: 600.0000 mg | ORAL_TABLET | Freq: Three times a day (TID) | ORAL | Status: DC

## 2016-08-02 MED ORDER — ONDANSETRON HCL 4 MG/2ML IJ SOLN: 4.0000 mg | Freq: Four times a day (QID) | INTRAMUSCULAR | Status: DC | PRN

## 2016-08-02 MED ORDER — PROPOFOL 500 MG/50ML IV EMUL: INTRAVENOUS | Status: DC | PRN

## 2016-08-02 MED ORDER — SODIUM CHLORIDE 0.9 % IV SOLN: 250.0000 mL | INTRAVENOUS | Status: DC

## 2016-08-02 MED ORDER — LACTATED RINGERS IV SOLN
INTRAVENOUS | Status: DC | PRN
  Administered 2016-08-02: 08:00:00 via INTRAVENOUS

## 2016-08-02 MED ORDER — SODIUM CHLORIDE 0.9% FLUSH
3.0000 mL | Freq: Two times a day (BID) | INTRAVENOUS | Status: DC
  Administered 2016-08-02 – 2016-08-07 (×10): 3 mL via INTRAVENOUS

## 2016-08-02 MED ORDER — ACETAMINOPHEN 325 MG PO TABS
650.0000 mg | ORAL_TABLET | ORAL | Status: DC | PRN
  Administered 2016-08-03: 650 mg via ORAL
  Filled 2016-08-02: qty 2

## 2016-08-02 MED ORDER — ATENOLOL 25 MG PO TABS
25.0000 mg | ORAL_TABLET | Freq: Every evening | ORAL | Status: DC
  Administered 2016-08-03 – 2016-08-06 (×4): 25 mg via ORAL
  Filled 2016-08-02 (×6): qty 1

## 2016-08-02 MED ORDER — PANTOPRAZOLE SODIUM 40 MG IV SOLR
40.0000 mg | Freq: Every day | INTRAVENOUS | Status: DC
  Administered 2016-08-02: 40 mg via INTRAVENOUS
  Filled 2016-08-02: qty 40

## 2016-08-02 MED ORDER — IPRATROPIUM-ALBUTEROL 0.5-2.5 (3) MG/3ML IN SOLN
3.0000 mL | Freq: Once | RESPIRATORY_TRACT | Status: AC
  Administered 2016-08-02: 3 mL via RESPIRATORY_TRACT

## 2016-08-02 MED ORDER — KCL IN DEXTROSE-NACL 20-5-0.45 MEQ/L-%-% IV SOLN: INTRAVENOUS | Status: DC

## 2016-08-02 MED ORDER — FLEET ENEMA 7-19 GM/118ML RE ENEM: 1.0000 | ENEMA | Freq: Once | RECTAL | Status: DC | PRN

## 2016-08-02 MED ORDER — IPRATROPIUM BROMIDE 0.02 % IN SOLN
RESPIRATORY_TRACT | Status: AC
  Filled 2016-08-02: qty 2.5

## 2016-08-02 MED ORDER — ALUM & MAG HYDROXIDE-SIMETH 200-200-20 MG/5ML PO SUSP: 30.0000 mL | Freq: Four times a day (QID) | ORAL | Status: DC | PRN

## 2016-08-02 MED ORDER — GABAPENTIN 300 MG PO CAPS
300.0000 mg | ORAL_CAPSULE | Freq: Three times a day (TID) | ORAL | Status: DC
  Administered 2016-08-02 – 2016-08-07 (×15): 300 mg via ORAL
  Filled 2016-08-02 (×17): qty 1

## 2016-08-02 MED ORDER — CEFAZOLIN SODIUM-DEXTROSE 2-4 GM/100ML-% IV SOLN
2.0000 g | Freq: Three times a day (TID) | INTRAVENOUS | Status: AC
  Administered 2016-08-02 (×2): 2 g via INTRAVENOUS
  Filled 2016-08-02 (×2): qty 100

## 2016-08-02 MED ORDER — PROPOFOL 10 MG/ML IV BOLUS
INTRAVENOUS | Status: AC
  Filled 2016-08-02: qty 20

## 2016-08-02 MED ORDER — PROMETHAZINE HCL 25 MG/ML IJ SOLN: 6.2500 mg | INTRAMUSCULAR | Status: DC | PRN

## 2016-08-02 MED ORDER — CEFAZOLIN SODIUM-DEXTROSE 2-4 GM/100ML-% IV SOLN
2.0000 g | INTRAVENOUS | Status: AC
  Administered 2016-08-02: 2 g via INTRAVENOUS
  Filled 2016-08-02: qty 100

## 2016-08-02 MED ORDER — FLUMAZENIL 1 MG/10ML IV SOLN
0.2000 mg | Freq: Once | INTRAVENOUS | Status: AC
  Administered 2016-08-02: 0.2 mg via INTRAVENOUS

## 2016-08-02 MED ORDER — HYDROCODONE-ACETAMINOPHEN 10-325 MG PO TABS: 1.0000 | ORAL_TABLET | Freq: Four times a day (QID) | ORAL | Status: DC

## 2016-08-02 MED ORDER — FLUMAZENIL 0.5 MG/5ML IV SOLN
INTRAVENOUS | Status: AC
  Filled 2016-08-02: qty 5

## 2016-08-02 MED ORDER — FENTANYL CITRATE (PF) 250 MCG/5ML IJ SOLN
INTRAMUSCULAR | Status: AC
  Filled 2016-08-02: qty 5

## 2016-08-02 MED ORDER — SERTRALINE HCL 50 MG PO TABS
150.0000 mg | ORAL_TABLET | ORAL | Status: DC
  Administered 2016-08-02 – 2016-08-06 (×5): 150 mg via ORAL
  Filled 2016-08-02 (×3): qty 1
  Filled 2016-08-02 (×2): qty 3
  Filled 2016-08-02: qty 1

## 2016-08-02 MED ORDER — CYCLOBENZAPRINE HCL 10 MG PO TABS
10.0000 mg | ORAL_TABLET | Freq: Three times a day (TID) | ORAL | Status: DC | PRN
  Administered 2016-08-02 – 2016-08-07 (×8): 10 mg via ORAL
  Filled 2016-08-02 (×8): qty 1

## 2016-08-02 MED ORDER — HYDROMORPHONE HCL 1 MG/ML IJ SOLN
0.2500 mg | INTRAMUSCULAR | Status: DC | PRN
  Administered 2016-08-02 (×2): 0.5 mg via INTRAVENOUS

## 2016-08-02 MED ORDER — ONDANSETRON HCL 4 MG/2ML IJ SOLN
INTRAMUSCULAR | Status: AC
  Filled 2016-08-02: qty 4

## 2016-08-02 MED ORDER — PROPOFOL 10 MG/ML IV BOLUS
INTRAVENOUS | Status: DC | PRN
  Administered 2016-08-02: 170 mg via INTRAVENOUS

## 2016-08-02 MED ORDER — POLYVINYL ALCOHOL-POVIDONE 1.4-0.6 % OP SOLN: 1.0000 [drp] | OPHTHALMIC | Status: DC | PRN

## 2016-08-02 MED ORDER — DEXAMETHASONE SODIUM PHOSPHATE 10 MG/ML IJ SOLN
INTRAMUSCULAR | Status: AC
  Filled 2016-08-02: qty 4

## 2016-08-02 MED ORDER — MIDAZOLAM HCL 2 MG/2ML IJ SOLN
INTRAMUSCULAR | Status: AC
  Filled 2016-08-02: qty 2

## 2016-08-02 MED ORDER — FENTANYL CITRATE (PF) 100 MCG/2ML IJ SOLN
INTRAMUSCULAR | Status: DC | PRN
  Administered 2016-08-02: 100 ug via INTRAVENOUS
  Administered 2016-08-02: 50 ug via INTRAVENOUS

## 2016-08-02 MED ORDER — LIDOCAINE-EPINEPHRINE 1 %-1:100000 IJ SOLN
INTRAMUSCULAR | Status: DC | PRN
  Administered 2016-08-02: 5 mL

## 2016-08-02 SURGICAL SUPPLY — 66 items
ADH SKN CLS APL DERMABOND .7 (GAUZE/BANDAGES/DRESSINGS) ×1
BLADE CLIPPER SURG (BLADE) IMPLANT
BOLT SPNL LRG 45X5.5XPLAT NS (Screw) IMPLANT
CAGE MODULUS XLW 8X22X50 - 10 (Cage) ×1 IMPLANT
CARTRIDGE OIL MAESTRO DRILL (MISCELLANEOUS) ×1 IMPLANT
COVER BACK TABLE 24X17X13 BIG (DRAPES) IMPLANT
COVER TABLE BACK 60X90 (DRAPES) ×1 IMPLANT
DECANTER SPIKE VIAL GLASS SM (MISCELLANEOUS) ×2 IMPLANT
DERMABOND ADVANCED (GAUZE/BANDAGES/DRESSINGS) ×1
DERMABOND ADVANCED .7 DNX12 (GAUZE/BANDAGES/DRESSINGS) ×2 IMPLANT
DIFFUSER DRILL AIR PNEUMATIC (MISCELLANEOUS) ×1 IMPLANT
DRAPE C-ARM 42X72 X-RAY (DRAPES) ×2 IMPLANT
DRAPE C-ARMOR (DRAPES) ×2 IMPLANT
DRAPE LAPAROTOMY 100X72X124 (DRAPES) ×2 IMPLANT
DRAPE POUCH INSTRU U-SHP 10X18 (DRAPES) ×2 IMPLANT
DRSG OPSITE POSTOP 3X4 (GAUZE/BANDAGES/DRESSINGS) ×2 IMPLANT
DURAPREP 26ML APPLICATOR (WOUND CARE) ×2 IMPLANT
ELECT REM PT RETURN 9FT ADLT (ELECTROSURGICAL) ×2
ELECTRODE REM PT RTRN 9FT ADLT (ELECTROSURGICAL) ×1 IMPLANT
GAUZE SPONGE 4X4 16PLY XRAY LF (GAUZE/BANDAGES/DRESSINGS) IMPLANT
GLOVE BIO SURGEON STRL SZ8 (GLOVE) ×2 IMPLANT
GLOVE BIOGEL PI IND STRL 7.5 (GLOVE) IMPLANT
GLOVE BIOGEL PI IND STRL 8 (GLOVE) ×1 IMPLANT
GLOVE BIOGEL PI IND STRL 8.5 (GLOVE) ×1 IMPLANT
GLOVE BIOGEL PI INDICATOR 7.5 (GLOVE) ×2
GLOVE BIOGEL PI INDICATOR 8 (GLOVE) ×3
GLOVE BIOGEL PI INDICATOR 8.5 (GLOVE) ×1
GLOVE ECLIPSE 7.5 STRL STRAW (GLOVE) ×3 IMPLANT
GLOVE ECLIPSE 8.0 STRL XLNG CF (GLOVE) ×2 IMPLANT
GLOVE EXAM NITRILE LRG STRL (GLOVE) IMPLANT
GLOVE EXAM NITRILE XL STR (GLOVE) IMPLANT
GLOVE EXAM NITRILE XS STR PU (GLOVE) IMPLANT
GLOVE SS BIOGEL STRL SZ 7.5 (GLOVE) IMPLANT
GLOVE SUPERSENSE BIOGEL SZ 7.5 (GLOVE) ×1
GOWN STRL REUS W/ TWL LRG LVL3 (GOWN DISPOSABLE) IMPLANT
GOWN STRL REUS W/ TWL XL LVL3 (GOWN DISPOSABLE) ×2 IMPLANT
GOWN STRL REUS W/TWL 2XL LVL3 (GOWN DISPOSABLE) ×2 IMPLANT
GOWN STRL REUS W/TWL LRG LVL3 (GOWN DISPOSABLE) ×2
GOWN STRL REUS W/TWL XL LVL3 (GOWN DISPOSABLE) ×2
KIT BASIN OR (CUSTOM PROCEDURE TRAY) ×2 IMPLANT
KIT DILATOR XLIF 5 (KITS) IMPLANT
KIT INFUSE XX SMALL 0.7CC (Orthopedic Implant) ×1 IMPLANT
KIT ROOM TURNOVER OR (KITS) ×2 IMPLANT
KIT SURGICAL ACCESS MAXCESS 4 (KITS) ×1 IMPLANT
KIT XLIF (KITS) ×1
MARKER SKIN DUAL TIP RULER LAB (MISCELLANEOUS) ×1 IMPLANT
MODULE NVM5 NEXT GEN EMG (NEEDLE) ×1 IMPLANT
NDL HYPO 25X1 1.5 SAFETY (NEEDLE) ×1 IMPLANT
NEEDLE HYPO 25X1 1.5 SAFETY (NEEDLE) ×2 IMPLANT
NS IRRIG 1000ML POUR BTL (IV SOLUTION) ×2 IMPLANT
OIL CARTRIDGE MAESTRO DRILL (MISCELLANEOUS)
PACK LAMINECTOMY NEURO (CUSTOM PROCEDURE TRAY) ×2 IMPLANT
PLATE 2H DECADE 8MM (Plate) ×1 IMPLANT
PUTTY BONE ATTRAX 5CC STRIP (Putty) ×1 IMPLANT
SCREW 45MM (Screw) ×4 IMPLANT
SPONGE LAP 4X18 X RAY DECT (DISPOSABLE) IMPLANT
SPONGE SURGIFOAM ABS GEL SZ50 (HEMOSTASIS) ×1 IMPLANT
STAPLER SKIN PROX WIDE 3.9 (STAPLE) ×2 IMPLANT
SUT VIC AB 1 CT1 18XBRD ANBCTR (SUTURE) ×1 IMPLANT
SUT VIC AB 1 CT1 8-18 (SUTURE) ×2
SUT VIC AB 2-0 CT1 18 (SUTURE) ×2 IMPLANT
SUT VIC AB 3-0 SH 8-18 (SUTURE) ×2 IMPLANT
TOWEL GREEN STERILE (TOWEL DISPOSABLE) ×2 IMPLANT
TOWEL GREEN STERILE FF (TOWEL DISPOSABLE) ×2 IMPLANT
TRAY FOLEY W/METER SILVER 16FR (SET/KITS/TRAYS/PACK) ×2 IMPLANT
WATER STERILE IRR 1000ML POUR (IV SOLUTION) ×2 IMPLANT

## 2016-08-02 NOTE — Progress Notes (Addendum)
Patient ID: [name], female   DOB: 1955-12-05, 61 y.o.   MRN: [id number] Alert, conversant, smiling. Reports lumbar pain with position changes, and right thigh pain as expected. Good strength BLE. Incisions without erythema, swelling, or drainage beneath Dermabond & honeycomb drsgs. Ambulated in brace short distances x2 already without issue.   Patient is doing well.

## 2016-08-02 NOTE — Anesthesia Postprocedure Evaluation (Signed)
Anesthesia Post Note  Patient: [name]  Procedure(s) Performed: Procedure(s) (LRB): Lumbar two-three Anterolateral Lumbar interbody fusion with lateral plate (N/A)     Patient location during evaluation: PACU Anesthesia Type: General Level of consciousness: awake and alert Pain management: pain level controlled Vital Signs Assessment: post-procedure vital signs reviewed and stable Respiratory status: spontaneous breathing, nonlabored ventilation, respiratory function stable and patient connected to nasal cannula oxygen Cardiovascular status: blood pressure returned to baseline and stable Postop Assessment: no signs of nausea or vomiting Anesthetic complications: no    Last Vitals:  Vitals:   08/02/16 1217 08/02/16 1244  BP: 130/60 (!) 147/57  Pulse: 86 86  Resp: (!) 23 20  Temp: 36.7 C 37 C    Last Pain:  Vitals:   08/02/16 1410  TempSrc:   PainSc: 10-Worst pain ever                 Shinichi Anguiano P Mallisa [place]ameda

## 2016-08-02 NOTE — Progress Notes (Signed)
nrb placed + nasal airway

## 2016-08-02 NOTE — Anesthesia Procedure Notes (Signed)
Procedure Name: Intubation Date/Time: 08/02/2016 7:42 AM Performed by: Tressia Miners LEFFEW Pre-anesthesia Checklist: Patient identified, Patient being monitored, Timeout performed, Emergency Drugs available and Suction available Patient Re-evaluated:Patient Re-evaluated prior to induction Oxygen Delivery Method: Circle System Utilized Preoxygenation: Pre-oxygenation with 100% oxygen Induction Type: IV induction Ventilation: Two handed mask ventilation required and Oral airway inserted - appropriate to patient size Laryngoscope Size: Miller and 2 Grade View: Grade II Tube type: Oral Tube size: 7.0 mm Number of attempts: 1 Airway Equipment and Method: Stylet Placement Confirmation: ETT inserted through vocal cords under direct vision,  positive ETCO2 and breath sounds checked- equal and bilateral Secured at: 22 cm Tube secured with: Tape Dental Injury: Teeth and Oropharynx as per pre-operative assessment

## 2016-08-02 NOTE — Progress Notes (Signed)
Sleepy, but arouses.  MAEW.  Breathing treatment for low SaO2.  Being monitored in PACU.

## 2016-08-02 NOTE — Evaluation (Signed)
Physical Therapy Evaluation Patient Details Name: [name] MRN: [id number] DOB: 1955-05-02 Today's Date: 08/02/2016   History of Present Illness  Pt is a 61 y/o female s/p L2-3 ALIF. PMH includes HTN, PE, CVA, anxiety, depression, and back surgery.   Clinical Impression  Pt s/p surgery above with deficits below. PTA, pt was independent with functional mobility. Upon eval, pt very limited by pain. Only tolerated short ambulation distance secondary to pain. Required cues throughout to maintain precautions. Pt reports husband will be available to assist at d/c. Has all necessary DME. No follow up PT recommended. Will continue to follow acutely to maximize functional mobility independence.     Follow Up Recommendations No PT follow up;Supervision for mobility/OOB    Equipment Recommendations  None recommended by PT    Recommendations for Other Services       Precautions / Restrictions Precautions Precautions: Back Precaution Booklet Issued: Yes (comment) Precaution Comments: Reviewed back precaution handout with pt.  Required Braces or Orthoses: Spinal Brace Spinal Brace: Lumbar corset;Applied in sitting position Restrictions Weight Bearing Restrictions: No      Mobility  Bed Mobility Overal bed mobility: Needs Assistance Bed Mobility: Rolling;Sidelying to Sit;Sit to Supine Rolling: Min guard Sidelying to sit: Min guard   Sit to supine: Min guard   General bed mobility comments: Min guard for safety and to ensure log roll technique. Upon entry to room from walking in the hallway, pt reporting increased pain and impulsive and returned to bed without using log roll. Educated to always use log roll technique to protect back.   Transfers Overall transfer level: Needs assistance Equipment used: None Transfers: Sit to/from Stand Sit to Stand: Min guard         General transfer comment: Min guard for safety. Stood X 2. On first attempt to stand, pt with increased pain  and required seated rest.   Ambulation/Gait Ambulation/Gait assistance: Min guard Ambulation Distance (Feet): 50 Feet Assistive device: 1 person hand held assist Gait Pattern/deviations: Step-through pattern;Decreased stride length Gait velocity: Decreased Gait velocity interpretation: Below normal speed for age/gender General Gait Details: Pt with very limited gait tolerance. After short distance, grabbing onto rails, and stated back pain was too much to continue walking. Min guard and HHA for safety. Educated pt that walking would help decrease pain and importance of mobility throughout the day.   Stairs            Wheelchair Mobility    Modified Rankin (Stroke Patients Only)       Balance Overall balance assessment: Needs assistance Sitting-balance support: No upper extremity supported;Feet supported Sitting balance-Leahy Scale: Fair     Standing balance support: Single extremity supported;During functional activity;No upper extremity supported Standing balance-Leahy Scale: Fair Standing balance comment: static standing without UE support                              Pertinent Vitals/Pain Pain Assessment: 0-10 Pain Score: 10-Worst pain ever Pain Location: back  Pain Descriptors / Indicators: Aching;Operative site guarding;Sore Pain Intervention(s): Monitored during session;Limited activity within patient's tolerance;Repositioned    Home Living Family/patient expects to be discharged to:: Private residence Living Arrangements: Spouse/significant other Available Help at Discharge: Family;Available 24 hours/day Type of Home: House Home Access: Stairs to enter Entrance Stairs-Rails: Right;Left;Can reach both Entrance Stairs-Number of Steps: 3 Home Layout: Two level Home Equipment: Walker - 2 wheels;Cane - single point;Shower seat - built in;Bedside commode  Prior Function Level of Independence: Independent               Hand Dominance         Extremity/Trunk Assessment   Upper Extremity Assessment Upper Extremity Assessment: Defer to OT evaluation    Lower Extremity Assessment Lower Extremity Assessment: RLE deficits/detail;Generalized weakness RLE Deficits / Details: Pain in R hip     Cervical / Trunk Assessment Cervical / Trunk Assessment: Other exceptions Cervical / Trunk Exceptions: s/p surgery   Communication   Communication: No difficulties  Cognition Arousal/Alertness: Awake/alert Behavior During Therapy: WFL for tasks assessed/performed Overall Cognitive Status: Within Functional Limits for tasks assessed                                        General Comments General comments (skin integrity, edema, etc.): Pt's husband present throughout session.     Exercises     Assessment/Plan    PT Assessment Patient needs continued PT services  PT Problem List Decreased strength;Decreased activity tolerance;Decreased balance;Decreased mobility;Decreased knowledge of use of DME;Decreased knowledge of precautions;Pain       PT Treatment Interventions DME instruction;Gait training;Stair training;Functional mobility training;Therapeutic activities;Neuromuscular re-education;Patient/family education    PT Goals (Current goals can be found in the Care Plan section)  Acute Rehab PT Goals Patient Stated Goal: to decrease pain  PT Goal Formulation: With patient/family Time For Goal Achievement: 08/09/16 Potential to Achieve Goals: Good    Frequency Min 5X/week   Barriers to discharge        Co-evaluation               AM-PAC PT "6 Clicks" Daily Activity  Outcome Measure Difficulty turning over in bed (including adjusting bedclothes, sheets and blankets)?: A Little Difficulty moving from lying on back to sitting on the side of the bed? : A Little Difficulty sitting down on and standing up from a chair with arms (e.g., wheelchair, bedside commode, etc,.)?: Total Help needed moving to  and from a bed to chair (including a wheelchair)?: A Little Help needed walking in hospital room?: A Little Help needed climbing 3-5 steps with a railing? : A Lot 6 Click Score: 15    End of Session Equipment Utilized During Treatment: Gait belt;Back brace Activity Tolerance: Patient limited by pain Patient left: in bed;with call bell/phone within reach;with family/visitor present Nurse Communication: Mobility status PT Visit Diagnosis: Other abnormalities of gait and mobility (R26.89);Pain Pain - part of body:  (back )    Time: 8768-1157 PT Time Calculation (min) (ACUTE ONLY): 18 min   Charges:   PT Evaluation $PT Eval Low Complexity: 1 Low     PT G Codes:        Leighton Ruff, PT, DPT  Acute Rehabilitation Services  Pager: [phone]   Rudean Hitt 08/02/2016, 3:00 PM

## 2016-08-02 NOTE — Progress Notes (Signed)
Patient arrived to PACU with O2 Sats in the 80's despite repositioning and simple mask. CRNA Ariel at bedside and placed on nonbreather. Nasal airway placed to right nare. Mark RN admitting patient administered 0.2mg  Flumazenil IV per order. Dr Roanna Banning at bedside and ordered duoneb. Given at 1015. O2 sats approaching 90-95% after nebulizer. Patient agitated but responding appropriately and moving all 4 extremities. Able to be placed on simple mask with O2 sat of 96. Will continue to monitor.

## 2016-08-02 NOTE — Transfer of Care (Signed)
Immediate Anesthesia Transfer of Care Note  Patient: [name]  Procedure(s) Performed: Procedure(s): Lumbar two-three Anterolateral Lumbar interbody fusion with lateral plate (N/A)  Patient Location: PACU  Anesthesia Type:General  Level of Consciousness: awake, alert  and responds to stimulation  Airway & Oxygen Therapy: Patient Spontanous Breathing and Patient connected to face mask oxygen  Post-op Assessment: Report given to RN, Post -op Vital signs reviewed and stable and Patient moving all extremities X 4  Post vital signs: Reviewed and stable  Last Vitals:  Vitals:   08/02/16 0623  BP: (!) 104/23  Pulse: 74  Resp: 18  Temp: 37.1 C    Last Pain:  Vitals:   08/02/16 0623  TempSrc: Oral  PainSc: 8          Complications: No apparent anesthesia complications

## 2016-08-02 NOTE — Progress Notes (Signed)
Patient being much more cooperative. Following commands, A&Ox4- Neuro intact with present, strong grips, dorsi flexion, and plantar flexion. Moved from stretcher to bed in [place] 8. 0.5mg  Dilaudid given for pain of 10/10 at 1045. Patient now asleep on right side, on simple mask- 02 sat 93%.

## 2016-08-02 NOTE — Anesthesia Preprocedure Evaluation (Addendum)
Anesthesia Evaluation  Patient identified by MRN, date of birth, ID band Patient awake    Reviewed: Allergy & Precautions, NPO status , Patient's Chart, lab work & pertinent test results, reviewed documented beta blocker date and time   History of Anesthesia Complications Negative for: history of anesthetic complications  Airway Mallampati: IV  TM Distance: >3 FB Neck ROM: Full    Dental  (+) Poor Dentition, Dental Advisory Given   Pulmonary PE PE x 3, last while hospitalized in March 2016   Pulmonary exam normal breath sounds clear to auscultation       Cardiovascular hypertension, Pt. on medications and Pt. on home beta blockers (-) angina(-) Past MI and (-) CHF  Rhythm:Regular Rate:Normal  ECG: NSR, rate 73  PCP is Valaria Good, PA-C/Dr. Celedonio Miyamoto. Ankita signed a note of medical clearance that indicates patient with be holding Xarelto for five days prior to surgery.    She is not routinely followed by cardiology, but was seen by Dr. Vivi Martens ([facility]) 11/19/08 for preoperative evaluation. She also had cardiac testing in 2016.  Echo 03/14/14 [facility]; scanned under Media tab, Correspondence 06/22/15): 1. Technically difficult study with suboptimal views 2. Mild concentric LVH 3. Overall LV systolic function normal, EF 55-60% 4. Mild MR 5. Mild TR   Neuro/Psych  Headaches, neg Seizures PSYCHIATRIC DISORDERS Anxiety Depression Stroke 3/16, no residual symptoms, left CNVII injury  CVA, No Residual Symptoms    GI/Hepatic GERD  Medicated and Controlled,  Endo/Other  Morbid obesity  Renal/GU      Musculoskeletal  (+) Fibromyalgia -  Abdominal (+) + obese,   Peds  Hematology  (+) anemia ,   Anesthesia Other Findings High cholesterol  Reproductive/Obstetrics                            Anesthesia Physical  Anesthesia Plan  ASA: III  Anesthesia Plan:  General   Post-op Pain Management:    Induction: Intravenous  PONV Risk Score and Plan: 3 and Ondansetron, Dexamethasone, Midazolam and Propofol infusion  Airway Management Planned: Oral ETT  Additional Equipment:   Intra-op Plan:   Post-operative Plan: Extubation in OR  Informed Consent: I have reviewed the patients History and Physical, chart, labs and discussed the procedure including the risks, benefits and alternatives for the proposed anesthesia with the patient or authorized representative who has indicated his/her understanding and acceptance.   Dental advisory given  Plan Discussed with: CRNA and Surgeon  Anesthesia Plan Comments: (Type and match)       Anesthesia Quick Evaluation

## 2016-08-02 NOTE — Brief Op Note (Addendum)
08/02/2016  9:48 AM  PATIENT:  [name]  61 y.o. female  PRE-OPERATIVE DIAGNOSIS:  Lumbar stenosis with neurogenic claudication, lumbar scoliosis, lumbar spondylosis, degenerative disc disease, lumbago, lumbar radiculopathy L 23 level  POST-OPERATIVE DIAGNOSIS:  Lumbar stenosis with neurogenic claudication, lumbar scoliosis, lumbar spondylosis, degenerative disc disease, lumbago, lumbar radiculopathy L 23 level  PROCEDURE:  Procedure(s): Lumbar two-three Anterolateral Lumbar interbody fusion with lateral plate (N/A)  SURGEON:  Surgeon(s) and Role:    Erline Levine, MD - Primary  PHYSICIAN ASSISTANT: Ditty  ASSISTANTS: Poteat, RN   ANESTHESIA:   general  EBL:  Total I/O In: 1780 [I.V.:900; Blood:630; IV Piggyback:250] Out: 60 [Urine:60]  BLOOD ADMINISTERED:none  DRAINS: none   LOCAL MEDICATIONS USED:  MARCAINE    and LIDOCAINE   SPECIMEN:  No Specimen  DISPOSITION OF SPECIMEN:  N/A  COUNTS:  YES  TOURNIQUET:  * No tourniquets in log *  DICTATION: DICTATION: DICTATION: Patient is a 61 year old with severe spondylosis stenosis and scoliosis of the lumbar spine with radiculopathy and lumbago. It was elected to take her to surgery for anterolateral decompression and lateral plate fixation at the L 23 level.  She had previously undergone discectomy L 23 level.  Procedure: Patient was brought to the operating room and placed in a left lateral decubitus position on the operative table and using orthogonally projected C-arm fluoroscopy the patient was placed so that the L 23  level was visualized in AP and lateral plane. The patient was then taped into position. The table was flexed so as to expose the L 23 level. Skin was marked along with a posterior finger dissection incision. Her flank was then prepped and draped in usual sterile fashion and incisions were made overlying the 12 th rib. Posterior finger dissection was made to enter the retroperitoneal space and then  subsequently the probe was inserted into the psoas muscle from the right side initially at the L 23 level. After mapping the neural elements were able to dock the probe per the posterior third of this vertebral level and without indications electrically of too close proximity to the neural tissues. Subsequently the self-retaining tractor was.after sequential dilators were utilized the shim was employed and the interspace was cleared of psoas muscle and then incised. A thorough discectomy was performed. Instruments were used to clear the interspace of disc material. After thorough discectomy was performed and this was performed using AP and lateral fluoroscopy an 8 lordotic by 50 x 22 mm implant was packed with extra extra small BMP andAttrax. This was tamped into position using the slides and its position was confirmed on AP and lateral fluoroscopy. A Decade two hole lateral plate (size 8) was affixed to the lateral spine with 5.0 x 45 mm screws. All screws were torque locked and positioning was confirmed with AP and lateral fluoroscopy. . Hemostasis was assured the wounds were irrigated and closed with interrupted Vicryl sutures.Sterile occlusive dressing was placed with Dermabond. The patient was then extubated in the operating room and taken to recovery in stable and satisfactory condition having tolerated his operation well. Counts were correct at the end of the case.  PLAN OF CARE: Admit to inpatient   PATIENT DISPOSITION:  PACU - hemodynamically stable.   Delay start of Pharmacological VTE agent (>24hrs) due to surgical blood loss or risk of bleeding: yes  Retractor time 30:17.

## 2016-08-02 NOTE — Interval H&P Note (Signed)
History and Physical Interval Note:  08/02/2016 7:15 AM  [name]  has presented today for surgery, with the diagnosis of Lumbar stenosis with neurogenic claudication  The various methods of treatment have been discussed with the patient and family. After consideration of risks, benefits and other options for treatment, the patient has consented to  Procedure(s) with comments: L2-3 Anterolateral Lumbar interbody fusion with lateral plate (N/A) - Z6-1 Anterolateral Lumbar interbody fusion with lateral plate as a surgical intervention .  The patient's history has been reviewed, patient examined, no change in status, stable for surgery.  I have reviewed the patient's chart and labs.  Questions were answered to the patient's satisfaction.     Gaylen Pereira D

## 2016-08-02 NOTE — Op Note (Signed)
08/02/2016  9:48 AM  PATIENT:  [name]  61 y.o. female  PRE-OPERATIVE DIAGNOSIS:  Lumbar stenosis with neurogenic claudication, lumbar scoliosis, lumbar spondylosis, degenerative disc disease, lumbago, lumbar radiculopathy L 23 level  POST-OPERATIVE DIAGNOSIS:  Lumbar stenosis with neurogenic claudication, lumbar scoliosis, lumbar spondylosis, degenerative disc disease, lumbago, lumbar radiculopathy L 23 level  PROCEDURE:  Procedure(s): Lumbar two-three Anterolateral Lumbar interbody fusion with lateral plate (N/A)  SURGEON:  Surgeon(s) and Role:    Erline Levine, MD - Primary  PHYSICIAN ASSISTANT: Ditty  ASSISTANTS: Poteat, RN   ANESTHESIA:   general  EBL:  Total I/O In: 1780 [I.V.:900; Blood:630; IV Piggyback:250] Out: 60 [Urine:60]  BLOOD ADMINISTERED:none  DRAINS: none   LOCAL MEDICATIONS USED:  MARCAINE    and LIDOCAINE   SPECIMEN:  No Specimen  DISPOSITION OF SPECIMEN:  N/A  COUNTS:  YES  TOURNIQUET:  * No tourniquets in log *  DICTATION: DICTATION: DICTATION: Patient is a 61 year old with severe spondylosis stenosis and scoliosis of the lumbar spine with radiculopathy and lumbago. It was elected to take her to surgery for anterolateral decompression and lateral plate fixation at the L 23 level.  She had previously undergone discectomy L 23 level.  Procedure: Patient was brought to the operating room and placed in a left lateral decubitus position on the operative table and using orthogonally projected C-arm fluoroscopy the patient was placed so that the L 23  level was visualized in AP and lateral plane. The patient was then taped into position. The table was flexed so as to expose the L 23 level. Skin was marked along with a posterior finger dissection incision. Her flank was then prepped and draped in usual sterile fashion and incisions were made overlying the 12 th rib. Posterior finger dissection was made to enter the retroperitoneal space and then  subsequently the probe was inserted into the psoas muscle from the right side initially at the L 23 level. After mapping the neural elements were able to dock the probe per the posterior third of this vertebral level and without indications electrically of too close proximity to the neural tissues. Subsequently the self-retaining tractor was.after sequential dilators were utilized the shim was employed and the interspace was cleared of psoas muscle and then incised. A thorough discectomy was performed. Instruments were used to clear the interspace of disc material. After thorough discectomy was performed and this was performed using AP and lateral fluoroscopy an 8 lordotic by 50 x 22 mm implant was packed with extra extra small BMP andAttrax. This was tamped into position using the slides and its position was confirmed on AP and lateral fluoroscopy. A Decade two hole lateral plate (size 8) was affixed to the lateral spine with 5.0 x 45 mm screws. All screws were torque locked and positioning was confirmed with AP and lateral fluoroscopy. . Hemostasis was assured the wounds were irrigated and closed with interrupted Vicryl sutures.Sterile occlusive dressing was placed with Dermabond. The patient was then extubated in the operating room and taken to recovery in stable and satisfactory condition having tolerated his operation well. Counts were correct at the end of the case.  PLAN OF CARE: Admit to inpatient   PATIENT DISPOSITION:  PACU - hemodynamically stable.   Delay start of Pharmacological VTE agent (>24hrs) due to surgical blood loss or risk of bleeding: yes  Retractor time 30:17.

## 2016-08-03 ENCOUNTER — Encounter (HOSPITAL_COMMUNITY): Payer: Self-pay | Admitting: Neurosurgery

## 2016-08-03 ENCOUNTER — Inpatient Hospital Stay (HOSPITAL_COMMUNITY): Payer: PPO

## 2016-08-03 DIAGNOSIS — N179 Acute kidney failure, unspecified: Secondary | ICD-10-CM

## 2016-08-03 DIAGNOSIS — J9600 Acute respiratory failure, unspecified whether with hypoxia or hypercapnia: Secondary | ICD-10-CM

## 2016-08-03 LAB — BLOOD GAS, ARTERIAL
Acid-Base Excess: 1.7 mmol/L (ref 0.0–2.0)
Bicarbonate: 27.4 mmol/L (ref 20.0–28.0)
Drawn by: 331761
O2 Content: 1 L/min
O2 Saturation: 84.8 %
Patient temperature: 98.9
pCO2 arterial: 56.9 mmHg — ABNORMAL HIGH (ref 32.0–48.0)
pH, Arterial: 7.305 — ABNORMAL LOW (ref 7.350–7.450)
pO2, Arterial: 56.2 mmHg — ABNORMAL LOW (ref 83.0–108.0)

## 2016-08-03 LAB — CBC WITH DIFFERENTIAL/PLATELET
Basophils Absolute: 0 10*3/uL (ref 0.0–0.1)
Basophils Relative: 0 %
Eosinophils Absolute: 0 10*3/uL (ref 0.0–0.7)
Eosinophils Relative: 0 %
HCT: 30.5 % — ABNORMAL LOW (ref 36.0–46.0)
Hemoglobin: 9.4 g/dL — ABNORMAL LOW (ref 12.0–15.0)
Lymphocytes Relative: 11 %
Lymphs Abs: 0.9 10*3/uL (ref 0.7–4.0)
MCH: 31.9 pg (ref 26.0–34.0)
MCHC: 30.8 g/dL (ref 30.0–36.0)
MCV: 103.4 fL — ABNORMAL HIGH (ref 78.0–100.0)
Monocytes Absolute: 0.7 10*3/uL (ref 0.1–1.0)
Monocytes Relative: 9 %
Neutro Abs: 6.7 10*3/uL (ref 1.7–7.7)
Neutrophils Relative %: 80 %
Platelets: 398 10*3/uL (ref 150–400)
RBC: 2.95 MIL/uL — ABNORMAL LOW (ref 3.87–5.11)
RDW: 26.8 % — ABNORMAL HIGH (ref 11.5–15.5)
WBC: 8.3 10*3/uL (ref 4.0–10.5)

## 2016-08-03 LAB — BASIC METABOLIC PANEL
Anion gap: 11 (ref 5–15)
BUN: 15 mg/dL (ref 6–20)
CO2: 27 mmol/L (ref 22–32)
Calcium: 8.9 mg/dL (ref 8.9–10.3)
Chloride: 100 mmol/L — ABNORMAL LOW (ref 101–111)
Creatinine, Ser: 1.49 mg/dL — ABNORMAL HIGH (ref 0.44–1.00)
GFR calc Af Amer: 43 mL/min — ABNORMAL LOW (ref >60)
GFR calc non Af Amer: 37 mL/min — ABNORMAL LOW (ref >60)
Glucose, Bld: 125 mg/dL — ABNORMAL HIGH (ref 65–99)
Potassium: 4.5 mmol/L (ref 3.5–5.1)
Sodium: 138 mmol/L (ref 135–145)

## 2016-08-03 MED ORDER — TRAMADOL HCL 50 MG PO TABS
50.0000 mg | ORAL_TABLET | Freq: Three times a day (TID) | ORAL | Status: DC | PRN
  Administered 2016-08-04 – 2016-08-06 (×6): 50 mg via ORAL
  Filled 2016-08-03 (×6): qty 1

## 2016-08-03 MED ORDER — ALBUTEROL SULFATE (2.5 MG/3ML) 0.083% IN NEBU
2.5000 mg | INHALATION_SOLUTION | RESPIRATORY_TRACT | Status: DC | PRN
  Administered 2016-08-03: 2.5 mg via RESPIRATORY_TRACT
  Filled 2016-08-03: qty 3

## 2016-08-03 MED ORDER — ACETAMINOPHEN 500 MG PO TABS
1000.0000 mg | ORAL_TABLET | Freq: Four times a day (QID) | ORAL | Status: AC
  Administered 2016-08-03 – 2016-08-04 (×4): 1000 mg via ORAL
  Filled 2016-08-03 (×4): qty 2

## 2016-08-03 MED ORDER — FENTANYL CITRATE (PF) 100 MCG/2ML IJ SOLN
12.5000 ug | INTRAMUSCULAR | Status: DC | PRN
Start: 2016-08-03 — End: 2016-08-07

## 2016-08-03 MED ORDER — SODIUM CHLORIDE 0.9 % IV SOLN
INTRAVENOUS | Status: DC
  Administered 2016-08-03 – 2016-08-04 (×2): via INTRAVENOUS

## 2016-08-03 MED ORDER — NALOXONE HCL 0.4 MG/ML IJ SOLN: 0.4000 mg | INTRAMUSCULAR | Status: DC | PRN

## 2016-08-03 MED ORDER — NALOXONE HCL 0.4 MG/ML IJ SOLN
INTRAMUSCULAR | Status: AC
  Administered 2016-08-03: 0.4 mg
  Filled 2016-08-03: qty 1

## 2016-08-03 NOTE — Progress Notes (Signed)
Upon shift change this morning, patient noted to be very drowsy, snoring loudly but easily arousable. She answers questions appropriately with strong grips, dorsi and plantar flex. MD aware. Patient given a dose of Narcan 0.4mg  IV with some result noted but falls easily back to sleep. Placed on Continuous Pulse Oximeter with readings at 94-98% on 3L oxygen. Falls down to mid 80's when off oxygen. Will continue to monitor.

## 2016-08-03 NOTE — Progress Notes (Addendum)
Subjective: Patient reports somnolent, answers questions briefly  Objective: Vital signs in last 24 hours: Temp:  [98 F (36.7 C)-100.7 F (38.2 C)] 99.2 F (37.3 C) (08/01 0550) Pulse Rate:  [80-95] 95 (08/01 0400) Resp:  [9-23] 18 (08/01 0400) BP: (114-147)/(49-87) 114/56 (08/01 0400) SpO2:  [85 %-100 %] 97 % (08/01 0400)  Intake/Output from previous day: 07/31 0701 - 08/01 0700 In: 2980 [P.O.:900; I.V.:900; Blood:630; IV Piggyback:250] Out: 1360 [Urine:1310; Blood:50] Intake/Output this shift: No intake/output data recorded.  Rouses to loud voice and touch; snoring even when talking. Poor eye contact, but does state appropriate persn, place, time. Follows commands with repeated requests. Incision without erythema, swelling, or drainage beneath Dermabond & honeycomb drsg. Good strength BLE.   Lab Results:  Recent Labs  08/03/16 0410  WBC 8.3  HGB 9.4*  HCT 30.5*  PLT 398   BMET  Recent Labs  08/03/16 0410  NA 138  K 4.5  CL 100*  CO2 27  GLUCOSE 125*  BUN 15  CREATININE 1.49*  CALCIUM 8.9    Studies/Results: Dg Lumbar Spine 2-3 Views  Result Date: 08/02/2016 CLINICAL DATA:  L2-3 fusion EXAM: LUMBAR SPINE - 2-3 VIEW; DG C-ARM 61-120 MIN COMPARISON:  None. FINDINGS: Changes of lateral fusion at L2-3. Normal alignment. No hardware complicating feature. IMPRESSION: Intraoperative imaging as above. Electronically Signed   By: Rolm Baptise M.D.   On: 08/02/2016 09:26   Dg C-arm 61-120 Min  Result Date: 08/02/2016 CLINICAL DATA:  L2-3 fusion EXAM: LUMBAR SPINE - 2-3 VIEW; DG C-ARM 61-120 MIN COMPARISON:  None. FINDINGS: Changes of lateral fusion at L2-3. Normal alignment. No hardware complicating feature. IMPRESSION: Intraoperative imaging as above. Electronically Signed   By: Rolm Baptise M.D.   On: 08/02/2016 09:26    Assessment/Plan:   LOS: 1 day  Suspect medication induced lethargy with ?sleep apnea (pt denies) hx contributing to current state. Will  reassess after waking a bit more. Needs to mobilize in brace.   Return visit elicits prompt responses to nurse's questions, but pt dozes intermittently during conversation, often removing nasal cannula. She states she usually wakes ~10am at home. Will monitor and encourage mobility.    Verdis Prime 08/03/2016, 7:29 AM   Will hold sedating medications and mobilize with PT.

## 2016-08-03 NOTE — Consult Note (Signed)
Name: [name] MRN: [id number] DOB: 08-Apr-1955    ADMISSION DATE:  08/02/2016 CONSULTATION DATE:  8/1  REFERRING MD :  Dr. Vertell Limber  CHIEF COMPLAINT:  Somnolence   HISTORY OF PRESENT ILLNESS:  61 year old female with PMH as below, who was admitted 7/31 for elective lumbar fusion under Dr. Vertell Limber. The procedure was without complication and postoperatively she was sent to the spine unit for recovery. The morning of 8/1 she was found to be somnolent with sonorous respirations. PCCM was consulted for further evaluation as there was concern for sleep apnea, although patient denied this in her history. She was given narcan by her primary team and She has woken up quite a bit.   She reports being overly tired for the past 3-4 months and sleeping commonly throughout the day. She has never worn, or been evaluated for CPAP. She does not know if she snores, but thinks that she probably does.   SIGNIFICANT EVENTS  7/31 admit for elective lumbar fusion 8/1 Somnolent, PCCM consult.  STUDIES:   PAST MEDICAL HISTORY :   has a past medical history of Anemia; Anxiety; Arthritis; Bell palsy; Chronic lower back pain; Colon polyps; Depression; Fibromyalgia; GERD (gastroesophageal reflux disease); High cholesterol; History of blood transfusion (01/2014); History of kidney stones; Hypertension; Migraine headache; Morbid obesity ([place]); Pulmonary embolism ([place]) (01/2008; 03/2014); Seasonal allergies; Stroke [facility]) (03/2014); Tumor, thyroid (2010); and Wears glasses.  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 Prior to Admission medications   Medication Sig Start Date End Date Taking? Authorizing Provider  allopurinol (ZYLOPRIM) 100 MG tablet Take 200 mg by mouth daily.    Yes [provider]  ARIPiprazole (ABILIFY) 10 MG tablet Take 10 mg by mouth daily.   Yes [provider]  atenolol (TENORMIN) 25 MG tablet Take 25 mg by mouth every evening. 06/10/16  Yes [provider]  buPROPion (WELLBUTRIN XL) 300 MG 24 hr tablet Take 300 mg by mouth every morning.   Yes [provider]  esomeprazole (NEXIUM 24HR) 20 MG capsule Take 20 mg by mouth daily before breakfast.   Yes [provider]  furosemide (LASIX) 20 MG tablet Take 20 mg by mouth daily as needed for fluid.   Yes [provider]  gabapentin (NEURONTIN) 600 MG tablet Take 600 mg by mouth 3 (three) times daily.   Yes [provider]  HYDROcodone-acetaminophen (NORCO) 10-325 MG tablet Take 1 tablet by mouth 4 (four) times daily. 06/10/16  Yes [provider]  polyvinyl alcohol-povidone (REFRESH) 1.4-0.6 % ophthalmic solution Place 1-2 drops into both eyes as needed (for dry eyes).   Yes [provider]  sertraline (ZOLOFT) 100 MG tablet Take 150 mg by mouth every morning. 04/14/15  Yes [provider]  simvastatin (ZOCOR) 20 MG tablet Take 20 mg by mouth every evening.    Yes [provider]  white petrolatum (VASELINE) GEL Apply 1 application topically daily.    Yes [provider]  XARELTO 20 MG TABS tablet Take 20 mg by mouth daily with supper.  05/04/15  Yes [provider]  midodrine (PROAMATINE) 2.5 MG tablet Take 2.5 mg by mouth every evening. 06/10/16   [provider]  Allergies  Allergen Reactions  . Shellfish-Derived Products Swelling and Anaphylaxis  . Pineapple Swelling and Other (See Comments)    Stomach upset    FAMILY HISTORY:  family history includes Alzheimer's disease in her unknown relative; Cancer  in her mother; Heart disease in her unknown relative; Hypertension in her father; Kidney disease in her unknown relative. SOCIAL HISTORY:  reports that she has never smoked. She has never used smokeless tobacco. She reports that she drinks alcohol. She reports that she does not use drugs.  REVIEW OF SYSTEMS:    Bolds are positive  Constitutional: weight loss, gain, night sweats, Fevers, chills, fatigue .  HEENT: headaches, Sore throat, sneezing, nasal congestion, post nasal drip, Difficulty swallowing, Tooth/dental problems, visual complaints visual changes, ear ache CV:  chest pain, radiates:,Orthopnea, PND, swelling in lower extremities, dizziness, palpitations, syncope.  GI  heartburn, indigestion, abdominal pain, nausea, vomiting, diarrhea, change in bowel habits, loss of appetite, bloody stools.  Resp: cough, productive: , hemoptysis, dyspnea, chest pain, pleuritic.  Skin: rash or itching or icterus GU: dysuria, change in color of urine, urgency or frequency. flank pain, hematuria  MS: lower back pain or swelling. decreased range of motion  Psych: change in mood or affect. depression or anxiety.  Neuro: difficulty with speech, weakness, numbness, ataxia    SUBJECTIVE:   VITAL SIGNS: Temp:  [98 F (36.7 C)-100.7 F (38.2 C)] 98.9 F (37.2 C) (08/01 0809) Pulse Rate:  [80-106] 106 (08/01 0809) Resp:  [9-23] 20 (08/01 0809) BP: (114-147)/(49-87) 137/70 (08/01 0809) SpO2:  [90 %-100 %] 94 % (08/01 0809)  PHYSICAL EXAMINATION: General:  Morbidly obese female in NAD Neuro:  Drowsy, but easily arouses to voice. Oriented x 3. Chronic R facial droop. Speech minimally slurred but 5/5 ROM all extremities.  HEENT:  [place]/AT, PERRL, no appreciable JVD Cardiovascular:  RRR, no MRG Lungs:  Clear, distant.  Abdomen:  Soft, non-tender, non-distended Musculoskeletal:  No acute deformity, edema, or ROM limitation Skin:  Grossly intact   Recent Labs Lab 08/03/16 0410  NA 138  K 4.5  CL  100*  CO2 27  BUN 15  CREATININE 1.49*  GLUCOSE 125*    Recent Labs Lab 08/03/16 0410  HGB 9.4*  HCT 30.5*  WBC 8.3  PLT 398   Dg Lumbar Spine 2-3 Views  Result Date: 08/02/2016 CLINICAL DATA:  L2-3 fusion EXAM: LUMBAR SPINE - 2-3 VIEW; DG C-ARM 61-120 MIN COMPARISON:  None. FINDINGS: Changes of lateral fusion at L2-3. Normal alignment. No hardware complicating feature. IMPRESSION: Intraoperative imaging as above. Electronically Signed   By: Rolm Baptise M.D.   On: 08/02/2016 09:26   Dg C-arm 61-120 Min  Result Date: 08/02/2016 CLINICAL DATA:  L2-3 fusion EXAM: LUMBAR SPINE - 2-3 VIEW; DG C-ARM 61-120 MIN COMPARISON:  None. FINDINGS: Changes of lateral fusion at L2-3. Normal alignment. No hardware complicating feature. IMPRESSION: Intraoperative imaging as above. Electronically Signed   By: Rolm Baptise M.D.   On: 08/02/2016 09:26    ASSESSMENT / PLAN:  Acute metabolic encephalopathy: multifactorial due to opioid pain medication, hypoventilation from lower back pain, post op ATX, and likely undiagnosed OSA vs OHS. She is much more awake now, responded well to narcan.  - ABG pending. - SpO2 goal 88-94% - Narcan gtt would be easy solution, however, need to control her pain.  - Minimize pain medication if possible, recommend substituting less sedating therapies. - Can try BiPAP PRN if gets sleepy again, if unable to tolerate due to mental  status will need to continue with narcan or proceed to intubation.  - Recommend continuing to monitor as is for now. If worsens can proceed as above. - Incentive spirometer - Will eventually need outpatient polysomnogram.   Rest per primary team.    Georgann Housekeeper, AGACNP-BC [facility] Pager [phone] or [phone]  08/03/2016 10:30 AM

## 2016-08-03 NOTE — Progress Notes (Signed)
Patient alert and oriented, MAE's well with no c/o pain at this time. Report given to receiving nurse. Patient transported out with rapid response and nurse tech. Patient's spouse called and notified of patient's status and new room number.

## 2016-08-03 NOTE — Progress Notes (Signed)
Physical Therapy Treatment Patient Details Name: [name] MRN: [id number] DOB: 11-20-1955 Today's Date: 08/03/2016    History of Present Illness Pt is a 61 y/o female s/p L2-3 ALIF. PMH includes HTN, PE, CVA, anxiety, depression, and back surgery.     PT Comments    Pt progressing slowly towards physical therapy goals. Pt very sleepy throughout session, and at one point fell asleep sitting up, requiring assist to keep from sliding off EOB. Pt having difficulty recalling precautions and engaging with therapist for education. Pt on 2L/min supplemental O2 throughout session, however sats dropping to 87% with mobility. Based on performance today, continued rehab at d/c may be beneficial. If pt shows improvement in mobility and tolerance for functional activity, may progress to home health or an outpatient level. Will continue to follow and progress as able per POC.    Follow Up Recommendations  SNF;Supervision for mobility/OOB     Equipment Recommendations  None recommended by PT    Recommendations for Other Services       Precautions / Restrictions Precautions Precautions: Back Precaution Booklet Issued: Yes (comment) Precaution Comments: Reviewed back precaution handout with pt.  Required Braces or Orthoses: Spinal Brace Spinal Brace: Lumbar corset;Applied in standing position (2 body habitus, unable to secure brace in sitting) Restrictions Weight Bearing Restrictions: No    Mobility  Bed Mobility Overal bed mobility: Needs Assistance Bed Mobility: Rolling;Sit to Sidelying Rolling: Min guard       Sit to sidelying: Min assist General bed mobility comments: Assist for LE elevation to bed. Pt was able to complete positioning with BUE support on the railings.   Transfers Overall transfer level: Needs assistance Equipment used: Rolling walker (2 wheeled) Transfers: Sit to/from Stand Sit to Stand: Min assist         General transfer comment: Min guard to min assist  for balance support and safety. Pt unsteady with transitions however was able to gain and maintain standing balance without UE support to adjust brace.  Ambulation/Gait             General Gait Details: Pt was not able to ambulate this session. Falling asleep EOB.   Stairs            Wheelchair Mobility    Modified Rankin (Stroke Patients Only)       Balance Overall balance assessment: Needs assistance Sitting-balance support: No upper extremity supported;Feet supported Sitting balance-Leahy Scale: Fair     Standing balance support: Single extremity supported;During functional activity;No upper extremity supported Standing balance-Leahy Scale: Fair Standing balance comment: static standing without UE support                             Cognition Arousal/Alertness: Awake/alert Behavior During Therapy: WFL for tasks assessed/performed Overall Cognitive Status: Within Functional Limits for tasks assessed                                        Exercises      General Comments General comments (skin integrity, edema, etc.): No family available during session      Pertinent Vitals/Pain Pain Assessment: Faces Faces Pain Scale: Hurts a little bit Pain Location: back  Pain Descriptors / Indicators: Aching;Operative site guarding;Sore Pain Intervention(s): Monitored during session    Home Living  Prior Function            PT Goals (current goals can now be found in the care plan section) Acute Rehab PT Goals Patient Stated Goal: Go back to sleep PT Goal Formulation: With patient Time For Goal Achievement: 08/09/16 Potential to Achieve Goals: Good Progress towards PT goals: Not progressing toward goals - comment (Unable to progress mobility today)    Frequency    Min 5X/week      PT Plan Discharge plan needs to be updated    Co-evaluation              AM-PAC PT "6 Clicks" Daily  Activity  Outcome Measure  Difficulty turning over in bed (including adjusting bedclothes, sheets and blankets)?: A Little Difficulty moving from lying on back to sitting on the side of the bed? : A Little Difficulty sitting down on and standing up from a chair with arms (e.g., wheelchair, bedside commode, etc,.)?: Total Help needed moving to and from a bed to chair (including a wheelchair)?: A Little Help needed walking in hospital room?: A Little Help needed climbing 3-5 steps with a railing? : A Lot 6 Click Score: 15    End of Session Equipment Utilized During Treatment: Back brace;Oxygen Activity Tolerance: Patient limited by lethargy Patient left: in bed;with call bell/phone within reach;Other (comment) (x-ray tech present) Nurse Communication: Mobility status PT Visit Diagnosis: Other abnormalities of gait and mobility (R26.89);Pain Pain - part of body:  (back )     Time: 1152-1208 PT Time Calculation (min) (ACUTE ONLY): 16 min  Charges:  $Therapeutic Activity: 8-22 mins                    G Codes:       Rolinda Roan, PT, DPT Acute Rehabilitation Services Pager: [phone]    Thelma Comp 08/03/2016, 1:04 PM

## 2016-08-03 NOTE — Evaluation (Signed)
Occupational Therapy Evaluation Patient Details Name: [name] MRN: [id number] DOB: 06-13-1955 Today's Date: 08/03/2016    History of Present Illness Pt is a 61 y/o female s/p L2-3 ALIF. PMH includes HTN, PE, CVA, anxiety, depression, and back surgery.    Clinical Impression   PTA, pt was living with her husband and was independent. Currently, pt requiring Max A for donning brace, Mod A for donning socks with AE, and Min A +2 for functional mobility. Pt requiring Max cues throughout session due to decreased attention, problem solving, and follow commands. Pt would benefit from further acute OT to facilitate safe dc and increase occupational performance and participation.  Recommend dc to SNF for further OT to increase pt safety and independence with ADLs and functional mobility.     Follow Up Recommendations  SNF;Supervision/Assistance - 24 hour;Other (comment) (May progress to home with [facility])    Equipment Recommendations  Other (comment) (Defer to next venue)    Recommendations for Other Services PT consult     Precautions / Restrictions Precautions Precautions: Back Precaution Booklet Issued: Yes (comment) Precaution Comments: Reviewed back precaution; pt unable to remember precautions throughout session Required Braces or Orthoses: Spinal Brace Spinal Brace: Lumbar corset;Applied in standing position (2 body habitus, unable to secure brace in sitting) Restrictions Weight Bearing Restrictions: No      Mobility Bed Mobility Overal bed mobility: Needs Assistance Bed Mobility: Rolling;Sit to Sidelying Rolling: Min assist Sidelying to sit: Mod assist     Sit to sidelying: Min assist General bed mobility comments: Provided Min A to increase safety with rolling to L side. Mod A to elevate trunk into sitting  Transfers Overall transfer level: Needs assistance Equipment used: Rolling walker (2 wheeled) Transfers: Sit to/from Stand Sit to Stand: Min assist          General transfer comment: Min guard to min assist for balance support and safety. Pt unsteady with transitions however was able to gain and maintain standing balance without UE support to adjust brace.    Balance Overall balance assessment: Needs assistance Sitting-balance support: No upper extremity supported;Feet supported Sitting balance-Leahy Scale: Fair     Standing balance support: Single extremity supported;During functional activity;No upper extremity supported Standing balance-Leahy Scale: Fair Standing balance comment: static standing without UE support                            ADL either performed or assessed with clinical judgement   ADL Overall ADL's : Needs assistance/impaired Eating/Feeding: Set up;Sitting;Bed level   Grooming: Min guard;Sitting;Bed level   Upper Body Bathing: Moderate assistance;Sitting   Lower Body Bathing: Maximal assistance;Sit to/from stand;+2 for safety/equipment   Upper Body Dressing : Maximal assistance;Sitting;Cueing for sequencing Upper Body Dressing Details (indicate cue type and reason): Pt required  Max A to don brace with Max cuing for step by step instructions. Pt unable to follow simpel commands and required multi-modal cuing throughout Lower Body Dressing: Moderate assistance;Sit to/from stand;+2 for safety/equipment;With adaptive equipment Lower Body Dressing Details (indicate cue type and reason): Educated pt on donning socks with sock aides. Pt required Mod A to manage sock aide due to decreased had strength and poor coordination. Pt will need further education on AE for LB dressing.             Functional mobility during ADLs: Minimal assistance;+2 for safety/equipment;Rolling walker (sit<>Stand) General ADL Comments: Pt requiring Max cuing throughout session due to increased confusion and  poor cogntition.      Vision         Perception     Praxis      Pertinent Vitals/Pain Pain Assessment: Faces Faces  Pain Scale: Hurts a little bit Pain Location: back  Pain Descriptors / Indicators: Aching;Operative site guarding;Sore Pain Intervention(s): Monitored during session;Repositioned     Hand Dominance Right   Extremity/Trunk Assessment Upper Extremity Assessment Upper Extremity Assessment: Generalized weakness   Lower Extremity Assessment Lower Extremity Assessment: Defer to PT evaluation RLE Deficits / Details: Pain in R hip    Cervical / Trunk Assessment Cervical / Trunk Assessment: Other exceptions Cervical / Trunk Exceptions: s/p surgery    Communication Communication Communication: No difficulties   Cognition Arousal/Alertness: Awake/alert Behavior During Therapy: WFL for tasks assessed/performed Overall Cognitive Status: Impaired/Different from baseline Area of Impairment: Attention;Memory;Following commands;Safety/judgement;Awareness;Problem solving                   Current Attention Level: Selective Memory: Decreased short-term memory;Decreased recall of precautions Following Commands: Follows one step commands inconsistently;Follows one step commands with increased time Safety/Judgement: Decreased awareness of safety Awareness: Emergent Problem Solving: Slow processing;Decreased initiation;Difficulty sequencing;Requires verbal cues;Requires tactile cues General Comments: When asked to state pt DOB and name; pt would repeat her name and address. Brought pt's error to her attention and she tried again, but repeated her name and address   General Comments  No family present    Exercises     Shoulder Instructions      Home Living Family/patient expects to be discharged to:: Private residence Living Arrangements: Spouse/significant other Available Help at Discharge: Family;Available 24 hours/day Type of Home: House Home Access: Stairs to enter [employer] of Steps: 3 Entrance Stairs-Rails: Right;Left;Can reach both Home Layout: Two  level Alternate Level Stairs-Number of Steps: 20 Alternate Level Stairs-Rails: Right Bathroom Shower/Tub: Occupational psychologist: Handicapped height     Home Equipment: Environmental consultant - 2 wheels;Cane - single point;Shower seat - built in;Bedside commode          Prior Functioning/Environment Level of Independence: Independent                 OT Problem List: Decreased range of motion;Decreased activity tolerance;Impaired balance (sitting and/or standing);Decreased cognition;Decreased safety awareness;Decreased knowledge of use of DME or AE;Decreased knowledge of precautions;Pain;Obesity      OT Treatment/Interventions: Self-care/ADL training;Therapeutic exercise;Energy conservation;DME and/or AE instruction;Therapeutic activities;Patient/family education    OT Goals(Current goals can be found in the care plan section) Acute Rehab OT Goals Patient Stated Goal: Go back to sleep OT Goal Formulation: With patient Time For Goal Achievement: 08/17/16 Potential to Achieve Goals: Good ADL Goals Pt Will Perform Grooming: with min guard assist;standing Pt Will Perform Lower Body Bathing: with min guard assist;sit to/from stand;with adaptive equipment Pt Will Perform Lower Body Dressing: with min guard assist;sit to/from stand;with adaptive equipment Pt Will Transfer to Toilet: ambulating;with min guard assist;bedside commode Pt Will Perform Toileting - Clothing Manipulation and hygiene: with min guard assist;sit to/from stand  OT Frequency: Min 2X/week   Barriers to D/C:            Co-evaluation              AM-PAC PT "6 Clicks" Daily Activity     Outcome Measure Help from another person eating meals?: None Help from another person taking care of personal grooming?: A Little Help from another person toileting, which includes using toliet, bedpan, or urinal?: A Lot Help  from another person bathing (including washing, rinsing, drying)?: A Lot Help from another person  to put on and taking off regular upper body clothing?: A Lot Help from another person to put on and taking off regular lower body clothing?: A Lot 6 Click Score: 15   End of Session Equipment Utilized During Treatment: Rolling walker;Back brace Nurse Communication: Mobility status;Precautions  Activity Tolerance: Patient limited by fatigue;Patient limited by lethargy;Patient limited by pain Patient left: with call bell/phone within reach;Other (comment);with nursing/sitter in room (EOB with PT)  OT Visit Diagnosis: Unsteadiness on feet (R26.81);Other abnormalities of gait and mobility (R26.89);Muscle weakness (generalized) (M62.81);Other symptoms and signs involving cognitive function;Pain Pain - Right/Left:  (Back) Pain - part of body:  (Back)                Time: 1499-6924 OT Time Calculation (min): 19 min Charges:  OT General Charges $OT Visit: 1 Procedure OT Evaluation $OT Eval Moderate Complexity: 1 Procedure G-Codes:     [place], OTR/L Acute Rehab Pager: [phone] Office: [place] 08/03/2016, 1:49 PM

## 2016-08-03 NOTE — Progress Notes (Signed)
Patient's oxygen saturation was 84% on 1L. MD notified and orders was given to increase oxygen level to 2L for saturation of 88% and not higher.

## 2016-08-03 NOTE — Progress Notes (Signed)
PCCM INTERVAL PROGRESS NOTE  ABG with mild resp acidosis. With her OSA, opioid need, and hypoventilation due to surgical pain it is probably best to transfer to SDU and offer her a few hours of BiPAP to correct this.    [name], AGACNP-BC [facility] Pulmonology/Critical Care Pager [phone] or [phone]  08/03/2016 12:22 PM

## 2016-08-03 NOTE — Progress Notes (Signed)
OT Cancellation    08/03/16 0800  OT Visit Information  Last OT Received On 08/03/16  Reason Eval/Treat Not Completed Fatigue/lethargy limiting ability to participate (Pt lethargic and sleepy. Rn reports it will be better to return around noon. Will return for OT eval later today as schedule allows. Thank you! )   [place], OTR/L Acute Rehab Pager: [phone] Office: [phone]

## 2016-08-04 LAB — MAGNESIUM: Magnesium: 2 mg/dL (ref 1.7–2.4)

## 2016-08-04 LAB — HEPATIC FUNCTION PANEL
ALT: 15 U/L (ref 14–54)
AST: 25 U/L (ref 15–41)
Albumin: 3.6 g/dL (ref 3.5–5.0)
Alkaline Phosphatase: 51 U/L (ref 38–126)
Bilirubin, Direct: 0.2 mg/dL (ref 0.1–0.5)
Indirect Bilirubin: 0.7 mg/dL (ref 0.3–0.9)
Total Bilirubin: 0.9 mg/dL (ref 0.3–1.2)
Total Protein: 7.1 g/dL (ref 6.5–8.1)

## 2016-08-04 LAB — PHOSPHORUS: Phosphorus: 4.1 mg/dL (ref 2.5–4.6)

## 2016-08-04 LAB — PROTIME-INR
INR: 1.2
Prothrombin Time: 15.3 seconds — ABNORMAL HIGH (ref 11.4–15.2)

## 2016-08-04 MED ORDER — ACETAMINOPHEN 500 MG PO TABS
1000.0000 mg | ORAL_TABLET | Freq: Four times a day (QID) | ORAL | Status: AC | PRN
  Administered 2016-08-04 – 2016-08-05 (×3): 1000 mg via ORAL
  Filled 2016-08-04 (×3): qty 2

## 2016-08-04 NOTE — Clinical Social Work Note (Signed)
CSW met with patient and husband to discuss possible SNF placement. They prefer that she returns home with HHPT. Patient would like to do this until she is able to drive and then transition to outpatient physical therapy. RNCM notified.  CSW signing off. Consult again if any other social work needs arise.   , CSW [phone]  

## 2016-08-04 NOTE — Progress Notes (Signed)
Occupational Therapy Treatment Patient Details Name: [name] MRN: [id number] DOB: [birth year] Today's Date: 08/04/2016    History of present illness Pt is a 61 y/o female s/p L2-3 ALIF. PMH includes HTN, PE, CVA, anxiety, depression, and back surgery.    OT comments  Pt alert throughout session and participating well. Performed bed mobility with only cues for technique. Required min guard assist to stand from toilet and chair. Needs cues for back precautions, but was able to state at end of session. Pt reports a supportive husband at home on which she can rely for IADL and to assist with LB bathing and dressing. Educated in benefits of toilet tongs and long handled bath sponge.   Follow Up Recommendations  Home health OT;Supervision/Assistance - 24 hour    Equipment Recommendations  None recommended by OT    Recommendations for Other Services      Precautions / Restrictions Precautions Precautions: Back;Fall Precaution Booklet Issued: Yes (comment) Precaution Comments: reviewed back precautions, later able to state 3/3 Required Braces or Orthoses: Spinal Brace Spinal Brace: Lumbar corset;Applied in sitting position (requires standing due to body habitus) Restrictions Weight Bearing Restrictions: No       Mobility Bed Mobility Overal bed mobility: Needs Assistance Bed Mobility: Rolling;Sidelying to Sit Rolling: Supervision Sidelying to sit: Supervision       General bed mobility comments: increased time, cues for log roll technique  Transfers Overall transfer level: Needs assistance Equipment used: Rolling walker (2 wheeled) Transfers: Sit to/from Stand Sit to Stand: Min guard         General transfer comment: cues for hand placement    Balance Overall balance assessment: Needs assistance   Sitting balance-Leahy Scale: Fair       Standing balance-Leahy Scale: Fair Standing balance comment: static standing without UE support when grooming and donning  brace                           ADL either performed or assessed with clinical judgement   ADL Overall ADL's : Needs assistance/impaired     Grooming: Set up;Sitting       Lower Body Bathing: Sitting/lateral leans;Maximal assistance Lower Body Bathing Details (indicate cue type and reason): recommended long handled bath sponge Upper Body Dressing : Moderate assistance;Sitting Upper Body Dressing Details (indicate cue type and reason): back brace Lower Body Dressing: Maximal assistance;Sit to/from stand Lower Body Dressing Details (indicate cue type and reason): pt prefers to rely on her husband  Toilet Transfer: Min guard;Ambulation;RW;Regular Toilet   Toileting- Clothing Manipulation and Hygiene: Minimal assistance;Sit to/from stand Toileting - Clothing Manipulation Details (indicate cue type and reason): educated pt in use of toilet tongs to avoid twisting     Functional mobility during ADLs: Minimal assistance;Rolling walker General ADL Comments: Pt was not doing housekeeping or meal prep even before surgery, will continue to rely on husband.     Vision       Perception     Praxis      Cognition Arousal/Alertness: Awake/alert Behavior During Therapy: WFL for tasks assessed/performed Overall Cognitive Status: Impaired/Different from baseline Area of Impairment: Memory                     Memory: Decreased recall of precautions;Decreased short-term memory   Safety/Judgement: Decreased awareness of safety   Problem Solving: Requires verbal cues          Exercises     Shoulder Instructions  General Comments      Pertinent Vitals/ Pain       Pain Assessment: 0-10 Pain Score: 5  Pain Location: back  Pain Descriptors / Indicators: Sore;Aching Pain Intervention(s): Repositioned;Monitored during session  Home Living                                          Prior Functioning/Environment               Frequency  Min 2X/week        Progress Toward Goals  OT Goals(current goals can now be found in the care plan section)  Progress towards OT goals: Progressing toward goals  Acute Rehab OT Goals Patient Stated Goal: go home to see her dogs OT Goal Formulation: With patient Time For Goal Achievement: 08/17/16 Potential to Achieve Goals: Good  Plan Discharge plan needs to be updated    Co-evaluation                 AM-PAC PT "6 Clicks" Daily Activity     Outcome Measure   Help from another person eating meals?: None Help from another person taking care of personal grooming?: A Little Help from another person toileting, which includes using toliet, bedpan, or urinal?: A Little Help from another person bathing (including washing, rinsing, drying)?: A Lot Help from another person to put on and taking off regular upper body clothing?: A Little Help from another person to put on and taking off regular lower body clothing?: A Lot 6 Click Score: 17    End of Session Equipment Utilized During Treatment: Rolling walker;Back brace  OT Visit Diagnosis: Unsteadiness on feet (R26.81);Other abnormalities of gait and mobility (R26.89);Muscle weakness (generalized) (M62.81);Other symptoms and signs involving cognitive function;Pain   Activity Tolerance Patient tolerated treatment well   Patient Left in chair;with call bell/phone within reach   Nurse Communication          Time: [phone] OT Time Calculation (min): 35 min  Charges: OT General Charges $OT Visit: 1 Procedure OT Treatments $Self Care/Home Management : 23-37 mins    Malka So 08/04/2016, 10:20 AM  [phone]

## 2016-08-04 NOTE — Consult Note (Signed)
[facility] CM Primary Care Navigator  08/04/2016  [name] 10/12/1955 [id number]   Met with patient and husband (Bill) at the bedside to identify possibledischarge needs. Patient reports having worsening pain to back and right leg that led to this admission.  Patient endorses Dr. Imran Haque with [facility] as herprimary care provider.  Patient states using [facility] in [place] to obtain medications without any problem. She reports managing her ownmedications at home using "pill box"system filled weekly.  Patient states she was driving prior to admission. Patient's husband will provide transportationto her doctors'appointments after discharge.   Patient verbalized that husband will be providing assistance with her care at home.  Anticipated discharge plan is home with home health services per patient.  Patient expressed understanding to call primary care provider's office when she returns home to schedule a post discharge follow-up appointment within a week or sooner if needs arise. Patient letter (with PCP's contact number) was provided as a reminder.  Explained to patient and husband about [facility] CM services available but patient denies any current issues,concerns or healthmanagement needs at this time.  However, she opted and verbally agreed to [facility] General Calls to followup with her recovery at home.  Referral to [facility]MI General calls made.  [facility] care management information provided for any future needs that she may have.   For questions, please contact:   , BSN, RN- BC Primary Care Navigator  Telephone: [phone] [facility] 

## 2016-08-04 NOTE — Progress Notes (Signed)
Physical Therapy Treatment Patient Details Name: [name] MRN: [id number] DOB: [birth year] Today's Date: 08/04/2016    History of Present Illness Pt is a 61 y/o female s/p L2-3 ALIF. PMH includes HTN, PE, CVA, anxiety, depression, and back surgery.     PT Comments    Progressing with mobility. Improved cognition compared to last session, according to notes. Pt rated pain 6/10. She recalled 2/3 back precautions. Still has some safety issues during mobility requiring cues and intermittent assist. No family present during session. Recommend SNF vs HHPT, depending on continued progress and assist level available at home.     Follow Up Recommendations  SNF vs Home health PT;Supervision/Assistance - 24 hour (depending on continued progress and assist level available at home)     Equipment Recommendations  None recommended by PT    Recommendations for Other Services       Precautions / Restrictions Precautions Precautions: Back;Fall Precaution Booklet Issued: Yes (comment) Precaution Comments: Reviewed back precaution; pt recalled 2/3 back precautions Required Braces or Orthoses: Spinal Brace Spinal Brace: Lumbar corset;Applied in standing position (2* body habitus) Restrictions Weight Bearing Restrictions: No    Mobility  Bed Mobility               General bed mobility comments: oob in recliner  Transfers Overall transfer level: Needs assistance Equipment used: Rolling walker (2 wheeled) Transfers: Sit to/from Stand Sit to Stand: Min guard         General transfer comment: close guard for safety. VCs hand placement.   Ambulation/Gait Ambulation/Gait assistance: Min assist Ambulation Distance (Feet): 120 Feet Assistive device: Rolling walker (2 wheeled) Gait Pattern/deviations: Step-through pattern;Decreased stride length     General Gait Details: Assist to safely maneuver walker intermittently. O2 sats fluctuated between 86-91% on RA during ambulation.  Cues for safety.    Stairs            Wheelchair Mobility    Modified Rankin (Stroke Patients Only)       Balance                                            Cognition Arousal/Alertness: Awake/alert Behavior During Therapy: WFL for tasks assessed/performed Overall Cognitive Status: Impaired/Different from baseline Area of Impairment: Safety/judgement                         Safety/Judgement: Decreased awareness of safety   Problem Solving: Requires verbal cues        Exercises      General Comments        Pertinent Vitals/Pain Pain Assessment: 0-10 Pain Score: 6  Pain Location: back  Pain Descriptors / Indicators: Sore;Aching Pain Intervention(s): Monitored during session;Repositioned;Patient requesting pain meds-RN notified    Home Living                      Prior Function            PT Goals (current goals can now be found in the care plan section) Progress towards PT goals: Progressing toward goals    Frequency    Min 5X/week      PT Plan Discharge plan needs to be updated    Co-evaluation              AM-PAC PT "6 Clicks" Daily Activity  Outcome Measure  Difficulty turning over in bed (including adjusting bedclothes, sheets and blankets)?: Total Difficulty moving from lying on back to sitting on the side of the bed? : Total Difficulty sitting down on and standing up from a chair with arms (e.g., wheelchair, bedside commode, etc,.)?: A Little Help needed moving to and from a bed to chair (including a wheelchair)?: A Little Help needed walking in hospital room?: A Little Help needed climbing 3-5 steps with a railing? : A Lot 6 Click Score: 13    End of Session Equipment Utilized During Treatment: Back brace Activity Tolerance: Patient tolerated treatment well Patient left: in chair;with call bell/phone within reach   PT Visit Diagnosis: Difficulty in walking, not elsewhere classified  (R26.2)     Time: 9977-4142 PT Time Calculation (min) (ACUTE ONLY): 18 min  Charges:  $Gait Training: 8-22 mins                    G Codes:         Weston Anna, MPT Pager: [phone]

## 2016-08-04 NOTE — Progress Notes (Signed)
Pt. seen for BiPAP/CPAP order for h/s,V-60 BiPAP remains in room for PRN use found in no apparent distress currently on n/c, refused CPAP set up @ this time, staff working with pt., RT to monitor.

## 2016-08-04 NOTE — Progress Notes (Addendum)
Subjective: Patient reports "I feel terrible.Marland KitchenMarland KitchenI couldn't sleep"  Objective: Vital signs in last 24 hours: Temp:  [97.8 F (36.6 C)-100.7 F (38.2 C)] 97.8 F (36.6 C) (08/02 0451) Pulse Rate:  [67-106] 74 (08/02 0451) Resp:  [18-26] 22 (08/02 0451) BP: (106-137)/(53-70) 118/58 (08/02 0451) SpO2:  [92 %-95 %] 93 % (08/02 0018) FiO2 (%):  [30 %] 30 % (08/01 1526) Weight:  [119.1 kg (262 lb 9.6 oz)] 119.1 kg (262 lb 9.6 oz) (08/02 0334)  Intake/Output from previous day: 08/01 0701 - 08/02 0700 In: 1963.8 [P.O.:840; I.V.:1123.8] Out: 200 [Urine:200] Intake/Output this shift: No intake/output data recorded.  Awake, conversant. Reports right hip and lumbar pain, as expected. Incisions right side without erythema, swelling, or drainage beneath honeycomb & Dermabond. No BM yet, but passing gas. Frustrated with "just tylenol" but acknowledges need to manage pain safely, minimizing sedating meds.   Lab Results:  Recent Labs  08/03/16 0410  WBC 8.3  HGB 9.4*  HCT 30.5*  PLT 398   BMET  Recent Labs  08/03/16 0410  NA 138  K 4.5  CL 100*  CO2 27  GLUCOSE 125*  BUN 15  CREATININE 1.49*  CALCIUM 8.9    Studies/Results: Dg Lumbar Spine 2-3 Views  Result Date: 08/02/2016 CLINICAL DATA:  L2-3 fusion EXAM: LUMBAR SPINE - 2-3 VIEW; DG C-ARM 61-120 MIN COMPARISON:  None. FINDINGS: Changes of lateral fusion at L2-3. Normal alignment. No hardware complicating feature. IMPRESSION: Intraoperative imaging as above. Electronically Signed   By: Rolm Baptise M.D.   On: 08/02/2016 09:26   Dg Chest Port 1 View  Result Date: 08/03/2016 CLINICAL DATA:  Shortness of breath EXAM: PORTABLE CHEST 1 VIEW COMPARISON:  07/14/2014; chest CT - 07/14/2014 FINDINGS: Grossly unchanged enlarged cardiac silhouette and mediastinal contours. No focal airspace opacities. No pleural effusion or pneumothorax. There is persistent mild elevation / eventration involving the medial aspect of the right  hemidiaphragm. No evidence of edema. No acute osseus abnormalities. IMPRESSION: Similar findings of cardiomegaly without superimposed acute cardiopulmonary disease. Electronically Signed   By: Sandi Mariscal M.D.   On: 08/03/2016 12:14   Dg C-arm 61-120 Min  Result Date: 08/02/2016 CLINICAL DATA:  L2-3 fusion EXAM: LUMBAR SPINE - 2-3 VIEW; DG C-ARM 61-120 MIN COMPARISON:  None. FINDINGS: Changes of lateral fusion at L2-3. Normal alignment. No hardware complicating feature. IMPRESSION: Intraoperative imaging as above. Electronically Signed   By: Rolm Baptise M.D.   On: 08/02/2016 09:26    Assessment/Plan: Improving  LOS: 2 days  Appreciate CCM's assistance. Continue to mobilize in brace.    Verdis Prime 08/04/2016, 7:41 AM  Saturations improved.  Mobilize with PT.

## 2016-08-04 NOTE — Progress Notes (Signed)
Name: [name] MRN: [id number] DOB: December 15, 1955    ADMISSION DATE:  08/02/2016 CONSULTATION DATE:  8/1  REFERRING MD :  Dr. Vertell Limber  CHIEF COMPLAINT:  Somnolence   HISTORY OF PRESENT ILLNESS:  61 year old female with PMH as below, who was admitted 7/31 for elective lumbar fusion under Dr. Vertell Limber. The procedure was without complication and postoperatively she was sent to the spine unit for recovery. The morning of 8/1 she was found to be somnolent with sonorous respirations. PCCM was consulted for further evaluation as there was concern for sleep apnea, although patient denied this in her history. She was given narcan by her primary team and She has woken up quite a bit.   She reports being overly tired for the past 3-4 months and sleeping commonly throughout the day. She has never worn, or been evaluated for CPAP. She does not know if she snores, but thinks that she probably does.   SIGNIFICANT EVENTS  7/31 admit for elective lumbar fusion 8/1 Somnolent, PCCM consult.  STUDIES:    SUBJECTIVE:  Refused CPAP overnight.  Says she will wear it at home, just didn't think she needed it last night.   Wants to go home.   VITAL SIGNS: Temp:  [97.8 F (36.6 C)-100.7 F (38.2 C)] 98 F (36.7 C) (08/02 1132) Pulse Rate:  [67-91] 91 (08/02 1132) Resp:  [18-26] 19 (08/02 1132) BP: (106-121)/(53-68) 121/60 (08/02 1132) SpO2:  [93 %-99 %] 99 % (08/02 1132) FiO2 (%):  [30 %] 30 % (08/01 1526) Weight:  [119.1 kg (262 lb 9.6 oz)] 119.1 kg (262 lb 9.6 oz) (08/02 0334)  PHYSICAL EXAMINATION: General:  Morbidly obese female in NAD in bed Neuro:  Awake, alert, appropriate, MAE  HEENT:  [place]/AT, PERRL, no appreciable JVD Cardiovascular:  RRR, no MRG Lungs:  resps even non labored, Clear, distant.  Abdomen:  Soft, non-tender, non-distended Musculoskeletal:  No acute deformity, edema, or ROM limitation Skin:  Grossly intact   Recent Labs Lab 08/03/16 0410  NA 138  K 4.5  CL 100*  CO2  27  BUN 15  CREATININE 1.49*  GLUCOSE 125*    Recent Labs Lab 08/03/16 0410  HGB 9.4*  HCT 30.5*  WBC 8.3  PLT 398   Dg Chest Port 1 View  Result Date: 08/03/2016 CLINICAL DATA:  Shortness of breath EXAM: PORTABLE CHEST 1 VIEW COMPARISON:  07/14/2014; chest CT - 07/14/2014 FINDINGS: Grossly unchanged enlarged cardiac silhouette and mediastinal contours. No focal airspace opacities. No pleural effusion or pneumothorax. There is persistent mild elevation / eventration involving the medial aspect of the right hemidiaphragm. No evidence of edema. No acute osseus abnormalities. IMPRESSION: Similar findings of cardiomegaly without superimposed acute cardiopulmonary disease. Electronically Signed   By: Sandi Mariscal M.D.   On: 08/03/2016 12:14    ASSESSMENT / PLAN:  Acute metabolic encephalopathy: Improved. Multifactorial due to opioid pain medication, hypoventilation from lower back pain, post op ATX, and likely undiagnosed OSA vs OHS. She is much more awake now, responded well to narcan. Undiagnosed OSA/OHS   Mild post op hypoxemic hypercapnic respiratory failure - r/t decompensated OSA/OHS in setting opioid pain medication.   PLAN -  qhs CPAP - encouraged compliance  Will need outpt sleep study  Avoid oversedation - limit opioids as able -- pain regimen adjusted and pt tolerating well   IS    Nickolas Madrid, NP 08/04/2016  12:23 PM Pager: [phone] or [phone]

## 2016-08-05 DIAGNOSIS — J9602 Acute respiratory failure with hypercapnia: Secondary | ICD-10-CM

## 2016-08-05 LAB — HEPATIC FUNCTION PANEL
ALT: 19 U/L (ref 14–54)
AST: 26 U/L (ref 15–41)
Albumin: 3.2 g/dL — ABNORMAL LOW (ref 3.5–5.0)
Alkaline Phosphatase: 51 U/L (ref 38–126)
Bilirubin, Direct: 0.3 mg/dL (ref 0.1–0.5)
Indirect Bilirubin: 0.7 mg/dL (ref 0.3–0.9)
Total Bilirubin: 1 mg/dL (ref 0.3–1.2)
Total Protein: 6.4 g/dL — ABNORMAL LOW (ref 6.5–8.1)

## 2016-08-05 LAB — BLOOD GAS, ARTERIAL
Acid-Base Excess: 3.5 mmol/L — ABNORMAL HIGH (ref 0.0–2.0)
Bicarbonate: 28.6 mmol/L — ABNORMAL HIGH (ref 20.0–28.0)
Delivery systems: POSITIVE
Drawn by: 414221
Expiratory PAP: 6
FIO2: 30
Inspiratory PAP: 12
Mode: POSITIVE
O2 Saturation: 92.4 %
Patient temperature: 98.6
RATE: 8 resp/min
pCO2 arterial: 52.1 mmHg — ABNORMAL HIGH (ref 32.0–48.0)
pH, Arterial: 7.359 (ref 7.350–7.450)
pO2, Arterial: 68.6 mmHg — ABNORMAL LOW (ref 83.0–108.0)

## 2016-08-05 LAB — CBC WITH DIFFERENTIAL/PLATELET
Basophils Absolute: 0 10*3/uL (ref 0.0–0.1)
Basophils Relative: 1 %
Eosinophils Absolute: 0.1 10*3/uL (ref 0.0–0.7)
Eosinophils Relative: 2 %
HCT: 25.9 % — ABNORMAL LOW (ref 36.0–46.0)
Hemoglobin: 8 g/dL — ABNORMAL LOW (ref 12.0–15.0)
Lymphocytes Relative: 21 %
Lymphs Abs: 1 10*3/uL (ref 0.7–4.0)
MCH: 32 pg (ref 26.0–34.0)
MCHC: 30.9 g/dL (ref 30.0–36.0)
MCV: 103.6 fL — ABNORMAL HIGH (ref 78.0–100.0)
Monocytes Absolute: 0.4 10*3/uL (ref 0.1–1.0)
Monocytes Relative: 8 %
Neutro Abs: 3.1 10*3/uL (ref 1.7–7.7)
Neutrophils Relative %: 68 %
Platelets: 251 10*3/uL (ref 150–400)
RBC: 2.5 MIL/uL — ABNORMAL LOW (ref 3.87–5.11)
RDW: 24.1 % — ABNORMAL HIGH (ref 11.5–15.5)
WBC: 4.6 10*3/uL (ref 4.0–10.5)

## 2016-08-05 LAB — PREPARE RBC (CROSSMATCH)

## 2016-08-05 LAB — BASIC METABOLIC PANEL
Anion gap: 7 (ref 5–15)
BUN: 14 mg/dL (ref 6–20)
CO2: 27 mmol/L (ref 22–32)
Calcium: 8.8 mg/dL — ABNORMAL LOW (ref 8.9–10.3)
Chloride: 103 mmol/L (ref 101–111)
Creatinine, Ser: 0.96 mg/dL (ref 0.44–1.00)
GFR calc Af Amer: >60 mL/min (ref >60)
GFR calc non Af Amer: >60 mL/min (ref >60)
Glucose, Bld: 109 mg/dL — ABNORMAL HIGH (ref 65–99)
Potassium: 3.9 mmol/L (ref 3.5–5.1)
Sodium: 137 mmol/L (ref 135–145)

## 2016-08-05 LAB — MAGNESIUM: Magnesium: 2.3 mg/dL (ref 1.7–2.4)

## 2016-08-05 LAB — PHOSPHORUS: Phosphorus: 3 mg/dL (ref 2.5–4.6)

## 2016-08-05 LAB — HEMOGLOBIN AND HEMATOCRIT, BLOOD
HCT: 28.5 % — ABNORMAL LOW (ref 36.0–46.0)
Hemoglobin: 8.8 g/dL — ABNORMAL LOW (ref 12.0–15.0)

## 2016-08-05 MED ORDER — SODIUM CHLORIDE 0.9 % IV SOLN
Freq: Once | INTRAVENOUS | Status: AC
  Administered 2016-08-05: 11:00:00 via INTRAVENOUS

## 2016-08-05 NOTE — Progress Notes (Addendum)
Occupational Therapy Treatment Patient Details Name: [name] MRN: [id number] DOB: October 08, 1955 Today's Date: 08/05/2016    History of present illness Pt is a 61 y/o female s/p L2-3 ALIF. PMH includes HTN, PE, CVA, anxiety, depression, and back surgery.    OT comments  Pt toileted with min assist for pericare and performed 3 activities at sink with supervision. Pt implementing back precautions in ADL and bed mobility without cues and able to state all 3. Walked in room without a device with supervision for lines/safety. 02 sats at 93% on RA with activity. Pt is eager to go home.   Follow Up Recommendations  Home health OT;Supervision/Assistance - 24 hour    Equipment Recommendations  None recommended by OT    Recommendations for Other Services      Precautions / Restrictions Precautions Precautions: Back;Fall Precaution Comments: pt able to state 3/3 back precautions  Required Braces or Orthoses: Spinal Brace Spinal Brace: Lumbar corset;Applied in sitting position       Mobility Bed Mobility Overal bed mobility: Modified Independent             General bed mobility comments: used log roll technique  Transfers Overall transfer level: Modified independent Equipment used: None                  Balance     Sitting balance-Leahy Scale: Good       Standing balance-Leahy Scale: Fair Standing balance comment: no LOB with ambulation without a device                           ADL either performed or assessed with clinical judgement   ADL Overall ADL's : Needs assistance/impaired     Grooming: Wash/dry hands;Brushing hair;Oral care;Standing;Min guard Grooming Details (indicate cue type and reason): used two cup method with toothbrushing         Upper Body Dressing : Minimal assistance;Sitting Upper Body Dressing Details (indicate cue type and reason): back brace     Toilet Transfer: Supervision/safety;Ambulation;Regular Toilet    Toileting- Clothing Manipulation and Hygiene: Minimal assistance;Sit to/from stand       Functional mobility during ADLs: Supervision/safety (no device)       Vision       Perception     Praxis      Cognition Arousal/Alertness: Awake/alert Behavior During Therapy: WFL for tasks assessed/performed Overall Cognitive Status: Within Functional Limits for tasks assessed                                          Exercises     Shoulder Instructions       General Comments      Pertinent Vitals/ Pain       Pain Assessment: Faces Faces Pain Scale: Hurts even more Pain Location: back  Pain Descriptors / Indicators: Sore;Spasm Pain Intervention(s): Repositioned;Premedicated before session;Monitored during session  Home Living                                          Prior Functioning/Environment              Frequency  Min 2X/week        Progress Toward Goals  OT Goals(current goals can now be found in  the care plan section)  Progress towards OT goals: Progressing toward goals  Acute Rehab OT Goals Patient Stated Goal: go home to see her dogs OT Goal Formulation: With patient Time For Goal Achievement: 08/17/16 Potential to Achieve Goals: Good  Plan Discharge plan remains appropriate    Co-evaluation                 AM-PAC PT "6 Clicks" Daily Activity     Outcome Measure   Help from another person eating meals?: None Help from another person taking care of personal grooming?: A Little Help from another person toileting, which includes using toliet, bedpan, or urinal?: A Little Help from another person bathing (including washing, rinsing, drying)?: A Lot Help from another person to put on and taking off regular upper body clothing?: A Little Help from another person to put on and taking off regular lower body clothing?: A Lot 6 Click Score: 17    End of Session Equipment Utilized During Treatment: Gait  belt;Back brace  OT Visit Diagnosis: Unsteadiness on feet (R26.81);Other abnormalities of gait and mobility (R26.89);Muscle weakness (generalized) (M62.81);Other symptoms and signs involving cognitive function;Pain   Activity Tolerance Patient tolerated treatment well   Patient Left in bed;in CPM   Nurse Communication          Time: 3254-9826 OT Time Calculation (min): 24 min  Charges: OT General Charges $OT Visit: 1 Procedure OT Treatments $Self Care/Home Management : 23-37 mins   Malka So 08/05/2016, 9:37 AM [phone]

## 2016-08-05 NOTE — Progress Notes (Signed)
Physical Therapy Treatment Patient Details Name: [name] MRN: [id number] DOB: 10-Mar-1955 Today's Date: 08/05/2016    History of Present Illness Pt is a 61 y/o female s/p L2-3 ALIF. PMH includes HTN, PE, CVA, anxiety, depression, and back surgery.     PT Comments    Pt doing well with mobility. Ready for dc home from PT standpoint. Pt requiring supplemental O2 with activity.   Follow Up Recommendations  Home health PT;Supervision - Intermittent     Equipment Recommendations  None recommended by PT    Recommendations for Other Services       Precautions / Restrictions Precautions Precautions: Back;Fall Required Braces or Orthoses: Spinal Brace Spinal Brace: Lumbar corset;Applied in sitting position    Mobility  Bed Mobility Overal bed mobility: Modified Independent             General bed mobility comments: used log roll technique  Transfers Overall transfer level: Modified independent Equipment used: None Transfers: Sit to/from Stand Sit to Stand: Modified independent (Device/Increase time)            Ambulation/Gait Ambulation/Gait assistance: Modified independent (Device/Increase time) Ambulation Distance (Feet): 250 Feet Assistive device: Rolling walker (2 wheeled);None Gait Pattern/deviations: Step-through pattern;Decreased stride length Gait velocity: decr Gait velocity interpretation: Below normal speed for age/gender General Gait Details: Pt amb with steady gait. In room no use of assistive device. In hall used rolling walker. Began amb on RA but had to replace O2 when SpO2 decr to 84% (See other note).   Stairs Stairs: Yes   Stair Management: One rail Right;Step to pattern;Forwards Number of Stairs: 10 General stair comments: No problems with stairs  Wheelchair Mobility    Modified Rankin (Stroke Patients Only)       Balance     Sitting balance-Leahy Scale: Good       Standing balance-Leahy Scale: Good Standing balance  comment: no LOB with ambulation without a device                            Cognition Arousal/Alertness: Awake/alert Behavior During Therapy: WFL for tasks assessed/performed Overall Cognitive Status: Within Functional Limits for tasks assessed                                        Exercises      General Comments        Pertinent Vitals/Pain Pain Assessment: Faces Faces Pain Scale: Hurts little more Pain Location: legs Pain Descriptors / Indicators: Sore;Spasm Pain Intervention(s): Limited activity within patient's tolerance;Monitored during session    Home Living                      Prior Function            PT Goals (current goals can now be found in the care plan section) Progress towards PT goals: Progressing toward goals    Frequency           PT Plan Discharge plan needs to be updated    Co-evaluation              AM-PAC PT "6 Clicks" Daily Activity  Outcome Measure  Difficulty turning over in bed (including adjusting bedclothes, sheets and blankets)?: A Little Difficulty moving from lying on back to sitting on the side of the bed? : A Little Difficulty sitting down  on and standing up from a chair with arms (e.g., wheelchair, bedside commode, etc,.)?: A Little Help needed moving to and from a bed to chair (including a wheelchair)?: None Help needed walking in hospital room?: None Help needed climbing 3-5 steps with a railing? : A Little 6 Click Score: 20    End of Session Equipment Utilized During Treatment: Back brace;Oxygen Activity Tolerance: Patient tolerated treatment well Patient left: in bed;with call bell/phone within reach Nurse Communication: Mobility status PT Visit Diagnosis: Other abnormalities of gait and mobility (R26.89);Pain Pain - Right/Left:  (back) Pain - part of body: Leg (back)     Time: 1350-1408 PT Time Calculation (min) (ACUTE ONLY): 18 min  Charges:  $Gait Training: 8-22  mins                    G Codes:       [facility] PT [place] 08/05/2016, 2:19 PM

## 2016-08-05 NOTE — Progress Notes (Signed)
SATURATION QUALIFICATIONS: (This note is used to comply with regulatory documentation for home oxygen)  Patient Saturations on Room Air at Rest = 90%  Patient Saturations on Room Air while Ambulating = 84%  Patient Saturations on 2 Liters of oxygen while Ambulating = 94%  Please briefly explain why patient needs home oxygen:Pt unable to maintain adequate oxygen levels with activity without supplemental O2.  [employer] PT [phone]

## 2016-08-05 NOTE — Progress Notes (Signed)
I spoke with Verdis Prime in regards to patient bieng discharged. I gave him the follow up H/H results and he stated that patient would be staying overnight as he felt patient would benefit from wearing Bipap this evening. I did tell him patient wore the bipap last night and that she desated today with ambulation. I gave patient and IS and encouraged use. Pt informed that she would not be discharged today per Dr. Vertell Limber  and Aaron Edelman, RN

## 2016-08-05 NOTE — Care Management Note (Signed)
Case Management Note  Patient Details  Name: [name] MRN: [id number] Date of Birth: 1955-06-09  Subjective/Objective:                 Spoke w patient at the bedside. Her plan is to return home with husband at DC. Patient declines [facility] services. She states she also has all DME needed at home. Amb sats reveal supplemental oxygen need per bedside RN. RN reporting to MD and will obtain home oxygen order. Patient would like to use [facility] for O2. Referral placed to [facility] clinical liaison.    Action/Plan:   Expected Discharge Date:                  Expected Discharge Plan:  Home/Self Care  In-House Referral:     Discharge planning Services  CM Consult  Post Acute Care Choice:  Durable Medical Equipment Choice offered to:  Patient  DME Arranged:  Oxygen DME Agency:  [place]:    [place] Agency:     Status of Service:  Completed, signed off  If discussed at [place] of Stay Meetings, dates discussed:    Additional Comments:  Carles Collet, RN 08/05/2016, 2:58 PM

## 2016-08-05 NOTE — Progress Notes (Addendum)
Subjective: Patient reports "I feel good today! I still hurt but I always do"  Objective: Vital signs in last 24 hours: Temp:  [98 F (36.7 C)-98.6 F (37 C)] 98.6 F (37 C) (08/03 0052) Pulse Rate:  [65-91] 87 (08/03 0257) Resp:  [19-23] 20 (08/03 0257) BP: (114-146)/(60-86) 142/67 (08/03 0257) SpO2:  [96 %-100 %] 100 % (08/03 0257) Weight:  [118.9 kg (262 lb 3.2 oz)] 118.9 kg (262 lb 3.2 oz) (08/03 0100)  Intake/Output from previous day: 08/02 0701 - 08/03 0700 In: 480 [P.O.:480] Out: -  Intake/Output this shift: No intake/output data recorded.  Alert, conversant. SpO2 at 95 on O2 via [place]. MAEW with good strength BLE. Incisions right side without erythea, swelling, or drainage beneath Dermabond & [place]. No BM yet, but she denies discomfort, passing gas.  Hgb at 8.0, low historically.   Lab Results:  Recent Labs  08/03/16 0410 08/05/16 0238  WBC 8.3 4.6  HGB 9.4* 8.0*  HCT 30.5* 25.9*  PLT 398 251   BMET  Recent Labs  08/03/16 0410 08/05/16 0238  NA 138 137  K 4.5 3.9  CL 100* 103  CO2 27 27  GLUCOSE 125* 109*  BUN 15 14  CREATININE 1.49* 0.96  CALCIUM 8.9 8.8*    Studies/Results: Dg Chest Port 1 View  Result Date: 08/03/2016 CLINICAL DATA:  Shortness of breath EXAM: PORTABLE CHEST 1 VIEW COMPARISON:  07/14/2014; chest CT - 07/14/2014 FINDINGS: Grossly unchanged enlarged cardiac silhouette and mediastinal contours. No focal airspace opacities. No pleural effusion or pneumothorax. There is persistent mild elevation / eventration involving the medial aspect of the right hemidiaphragm. No evidence of edema. No acute osseus abnormalities. IMPRESSION: Similar findings of cardiomegaly without superimposed acute cardiopulmonary disease. Electronically Signed   By: Sandi Mariscal M.D.   On: 08/03/2016 12:14    Assessment/Plan: Improving  LOS: 3 days  Pt hopes to go home today, but understands she will need to wean O2. She also agrees to CCM's recommendation for  pulmonary follow-up, albeit reluctantly.  She continues to attribute lethergy and weakness soley to her history of low Hgb, denying sleep apnea. She has been able to ambulate in hallway with PT.    PoteatAaron Edelman 08/05/2016, 7:26 AM   Transfuse one unit PRBC's.  Observe today.

## 2016-08-05 NOTE — Progress Notes (Addendum)
Patient ID: [name], female   DOB: 16-Sep-1955, 61 y.o.   MRN: [id number] Per Dr. Vertell Limber, transfuse 1 unit PRC's. Order entered.  May be able to go home this afternoon if feeling well.   Will optimize hgb.

## 2016-08-05 NOTE — Care Management Important Message (Signed)
Important Message  Patient Details  Name: [name] MRN: [id number] Date of Birth: October 24, 1955   Medicare Important Message Given:  Yes    Nathen May 08/05/2016, 9:52 AM

## 2016-08-05 NOTE — Progress Notes (Signed)
Updated by [facility] that insurance will not cover O2 due to no qualifying long term diagnosis (ie CHF, COPD). Spoke with RN to request pulmonary toilet and ambulation overnight. Spoke w patient and spouse at bedside. Explained out of pocket cost ~$280 a month for home oxygen. Explained importance of IS multiple times an hour, deep breathing, and coughingalong w staff assisted ambulation over night. Hopefully this in conjunction with transfusion today may yield better repeat ambulatory saturations tomorrow. CM will continue to follow.

## 2016-08-06 LAB — BPAM RBC
Blood Product Expiration Date: 201808142359
Blood Product Expiration Date: 201808172359
Blood Product Expiration Date: 201808232359
ISSUE DATE / TIME: 201807310556
ISSUE DATE / TIME: 201807310556
ISSUE DATE / TIME: 201808031019
Unit Type and Rh: 6200
Unit Type and Rh: 6200
Unit Type and Rh: 6200

## 2016-08-06 LAB — CBC WITH DIFFERENTIAL/PLATELET
Basophils Absolute: 0 10*3/uL (ref 0.0–0.1)
Basophils Relative: 1 %
Eosinophils Absolute: 0.1 10*3/uL (ref 0.0–0.7)
Eosinophils Relative: 3 %
HCT: 29.1 % — ABNORMAL LOW (ref 36.0–46.0)
Hemoglobin: 8.9 g/dL — ABNORMAL LOW (ref 12.0–15.0)
Lymphocytes Relative: 21 %
Lymphs Abs: 1 10*3/uL (ref 0.7–4.0)
MCH: 31 pg (ref 26.0–34.0)
MCHC: 30.6 g/dL (ref 30.0–36.0)
MCV: 101.4 fL — ABNORMAL HIGH (ref 78.0–100.0)
Monocytes Absolute: 0.4 10*3/uL (ref 0.1–1.0)
Monocytes Relative: 8 %
Neutro Abs: 3.2 10*3/uL (ref 1.7–7.7)
Neutrophils Relative %: 67 %
Platelets: 270 10*3/uL (ref 150–400)
RBC: 2.87 MIL/uL — ABNORMAL LOW (ref 3.87–5.11)
RDW: 26.1 % — ABNORMAL HIGH (ref 11.5–15.5)
WBC: 4.7 10*3/uL (ref 4.0–10.5)

## 2016-08-06 LAB — TYPE AND SCREEN
ABO/RH(D): A POS
Antibody Screen: NEGATIVE
Unit division: 0
Unit division: 0
Unit division: 0

## 2016-08-06 LAB — MAGNESIUM: Magnesium: 2 mg/dL (ref 1.7–2.4)

## 2016-08-06 LAB — PHOSPHORUS: Phosphorus: 4 mg/dL (ref 2.5–4.6)

## 2016-08-06 MED ORDER — TRAMADOL HCL 50 MG PO TABS
100.0000 mg | ORAL_TABLET | Freq: Four times a day (QID) | ORAL | Status: DC | PRN
  Administered 2016-08-06 – 2016-08-07 (×4): 100 mg via ORAL
  Filled 2016-08-06 (×4): qty 2

## 2016-08-06 MED ORDER — LOPERAMIDE HCL 2 MG PO CAPS
4.0000 mg | ORAL_CAPSULE | ORAL | Status: DC | PRN
  Administered 2016-08-06: 4 mg via ORAL
  Filled 2016-08-06: qty 2

## 2016-08-06 NOTE — Progress Notes (Signed)
STAFF NOTE: I  S: reflect care foir 08/05/16. She is much better. Uses CPAP. Says will follow ith Dr Lyndel Safe in [address] for undiagnosed OSA. Pain better  O: obese Facial palsy Looks better No distress   Recent Labs Lab 08/03/16 0410 08/05/16 0238 08/05/16 1448 08/06/16 0350  HGB 9.4* 8.0* 8.8* 8.9*  HCT 30.5* 25.9* 28.5* 29.1*  WBC 8.3 4.6  --  4.7  PLT 398 251  --  270    Recent Labs Lab 08/03/16 0410 08/04/16 0319 08/05/16 0238 08/06/16 0350  NA 138  --  137  --   K 4.5  --  3.9  --   CL 100*  --  103  --   CO2 27  --  27  --   GLUCOSE 125*  --  109*  --   BUN 15  --  14  --   CREATININE 1.49*  --  0.96  --   CALCIUM 8.9  --  8.8*  --   MG  --  2.0 2.3 2.0  PHOS  --  4.1 3.0 4.0     A: Undiagnosed OSA - cpap 1qhs and opd fu with Dr Lyndel Safe AKI - resolved Pain - fent prn  PCCM will sign off    Dr. Brand Males, M.D., [facility].C.P Pulmonary and Critical Care Medicine Staff Physician [facility] Pager: [phone], If no answer or between  15:00h - 7:00h: call [phone]  08/06/2016 12:51 PM

## 2016-08-06 NOTE — Progress Notes (Signed)
Pt ambulated tonight in hallway on room air. Pt desat into the 80's after reaching the nurses station from room. Pt returned to room and placed on 2L. Oxygen levels rose to high 90's. No respiratoy distress noted. BiPAP to be placed for sleep. Will continue to monitor closely.  Jacqlyn Larsen, RN

## 2016-08-06 NOTE — Progress Notes (Signed)
Patient ID: [name], female   DOB: Sep 22, 1955, 61 y.o.   MRN: [id number] Subjective:  The patient is alert and pleasant. She wants to go home. She has not ambulated. She still using nasal cannula oxygen. She is concerned about the cause of home O2.  Objective: Vital signs in last 24 hours: Temp:  [98.1 F (36.7 C)-98.8 F (37.1 C)] 98.3 F (36.8 C) (08/04 0752) Pulse Rate:  [64-76] 74 (08/04 0752) Resp:  [17-24] 22 (08/04 0752) BP: (133-163)/(52-69) 140/63 (08/04 0752) SpO2:  [94 %-99 %] 94 % (08/04 0752) Weight:  [120.3 kg (265 lb 4.8 oz)] 120.3 kg (265 lb 4.8 oz) (08/04 0500)  Intake/Output from previous day: 08/03 0701 - 08/04 0700 In: 360 [P.O.:360] Out: 800 [Urine:800] Intake/Output this shift: No intake/output data recorded.  Physical exam the patient is alert and pleasant. She is moving her lower extremities well.  Lab Results:  Recent Labs  08/05/16 0238 08/05/16 1448 08/06/16 0350  WBC 4.6  --  4.7  HGB 8.0* 8.8* 8.9*  HCT 25.9* 28.5* 29.1*  PLT 251  --  270   BMET  Recent Labs  08/05/16 0238  NA 137  K 3.9  CL 103  CO2 27  GLUCOSE 109*  BUN 14  CREATININE 0.96  CALCIUM 8.8*    Studies/Results: No results found.  Assessment/Plan: Postop day #4: The patient is doing well neurologically.  Acute blood loss anemia: The patient's hemoglobin has improved status post transfusion.  Hypoxia: We will see the patient can tolerate mobilization without oxygen.  LOS: 4 days     [name] D 08/06/2016, 10:49 AM

## 2016-08-06 NOTE — Progress Notes (Signed)
SATURATION QUALIFICATIONS: (This note is used to comply with regulatory documentation for home oxygen)  Patient Saturations on Room Air at Rest = 95 %  Patient Saturations on Room Air while Ambulating =  86 %  Patient Saturations on 2 Liters of oxygen while Ambulating = 93 %  Ferdinand Lango, RN

## 2016-08-06 NOTE — Progress Notes (Signed)
Pt is on BiPAP at this time tolerating it well.

## 2016-08-06 NOTE — Progress Notes (Signed)
Approx 0200, responded to patient calling out for Flexeril. After administering drug a half a pill was noted to be in the patient's gown. Asked the patient what the pill was because a half pill had not been administered by this RN. Pt stated it was "Norco". Pt was asked was she taking her own supply of narcotics and she replied "yes". A 120 tablet bottle of Hydrocodone 10-325mg  was found in pt's luggage which contained 48 pills, including the half tablet found in pt's gown. Pt was educated of the dangers of taking own supply of narcotics while receiving narcotics from hospital staff. Pt stated that this was the only time that she attempted to take her own meds. Pill bottle was confiscated and released to the pharmacy. Pt was made aware that pills will be returned at discharge. Pt alert and oriented and not noted to be in any distress. Will continue to monitor closely.  Jacqlyn Larsen, RN

## 2016-08-07 LAB — CBC WITH DIFFERENTIAL/PLATELET
Basophils Absolute: 0 10*3/uL (ref 0.0–0.1)
Basophils Relative: 1 %
Eosinophils Absolute: 0.1 10*3/uL (ref 0.0–0.7)
Eosinophils Relative: 3 %
HCT: 28.7 % — ABNORMAL LOW (ref 36.0–46.0)
Hemoglobin: 8.8 g/dL — ABNORMAL LOW (ref 12.0–15.0)
Lymphocytes Relative: 27 %
Lymphs Abs: 1.1 10*3/uL (ref 0.7–4.0)
MCH: 31.1 pg (ref 26.0–34.0)
MCHC: 30.7 g/dL (ref 30.0–36.0)
MCV: 101.4 fL — ABNORMAL HIGH (ref 78.0–100.0)
Monocytes Absolute: 0.4 10*3/uL (ref 0.1–1.0)
Monocytes Relative: 9 %
Neutro Abs: 2.4 10*3/uL (ref 1.7–7.7)
Neutrophils Relative %: 60 %
Platelets: 292 10*3/uL (ref 150–400)
RBC: 2.83 MIL/uL — ABNORMAL LOW (ref 3.87–5.11)
RDW: 25.4 % — ABNORMAL HIGH (ref 11.5–15.5)
WBC: 4 10*3/uL (ref 4.0–10.5)

## 2016-08-07 LAB — MAGNESIUM: Magnesium: 1.9 mg/dL (ref 1.7–2.4)

## 2016-08-07 LAB — PHOSPHORUS: Phosphorus: 4.4 mg/dL (ref 2.5–4.6)

## 2016-08-07 MED ORDER — TRAMADOL HCL 50 MG PO TABS: 100.0000 mg | ORAL_TABLET | Freq: Four times a day (QID) | ORAL | 1 refills | Status: DC | PRN

## 2016-08-07 MED ORDER — DOCUSATE SODIUM 100 MG PO CAPS: 100.0000 mg | ORAL_CAPSULE | Freq: Two times a day (BID) | ORAL | 0 refills | Status: DC

## 2016-08-07 NOTE — Progress Notes (Signed)
Spoke with pt after receiving call that pt had changed mind about Oxygen. She and husband agree that it is better to "have  O2 and not need it than to need it and not have it" Hoping to eventually not need it with the use of IS and CPAP, ambulation and weight loss. Made Brad with [facility] aware of need for DME prior to Discharge today.

## 2016-08-07 NOTE — Discharge Summary (Signed)
Physician Discharge Summary  Patient ID: [name] MRN: [id number] DOB/AGE: 1955/03/01 61 y.o.  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 Vertell Limber performed and L2-3 anterior lateral interbody fusion and plating on the patient on 08/02/2016.  The patient's postoperative course was remarkable for blood loss anemia. She was transfused 1 unit of packed cells.  The patient also had postoperative hypoxia. She was seen by the pulmonologist. It was recommended that she go home on home oxygen at 2 L/m. Initially the patient refused this because it wasn't covered by insurance. She has changed her mind because she wants to go home. Arrangements were made for home oxygen therapy. The patient was given written and oral discharge instructions.  Consults: Pulmonology Significant Diagnostic Studies: None Treatments: L2-3 anterolateral interbody fusion and plating Discharge Exam: Blood pressure 140/66, pulse 66, temperature 99.2 F (37.3 C), temperature source Axillary, resp. rate 19, weight 119.4 kg (263 lb 4.8 oz), SpO2 98 %. The patient is alert and pleasant. She is in no apparent distress. Her room air oxygen saturations are 86%. Her saturations are in the mid 90s on oxygen . Her strength is normal and lower extremities.  Disposition: Home  Discharge Instructions    Call MD for:  difficulty breathing, headache or visual disturbances    Complete by:  As directed    Call MD for:  extreme fatigue    Complete by:  As directed    Call MD for:  hives    Complete by:  As directed    Call MD for:  persistant dizziness or light-headedness    Complete by:  As directed    Call MD for:   persistant nausea and vomiting    Complete by:  As directed    Call MD for:  redness, tenderness, or signs of infection (pain, swelling, redness, odor or green/yellow discharge around incision site)    Complete by:  As directed    Call MD for:  severe uncontrolled pain    Complete by:  As directed    Call MD for:  temperature >100.4    Complete by:  As directed    Diet - low sodium heart healthy    Complete by:  As directed    Discharge instructions    Complete by:  As directed    Call [phone] for a followup appointment. Take a stool softener while you are using pain medications.   Driving Restrictions    Complete by:  As directed    Do not drive for 2 weeks.   Increase activity slowly    Complete by:  As directed    Lifting restrictions    Complete by:  As directed    Do not lift more than 5 pounds. No excessive bending or twisting.   May shower / Bathe    Complete by:  As directed    He may shower after the pain she is removed 3 days after surgery. Leave the incision alone.   No dressing needed    Complete by:  As directed      Allergies as of 08/07/2016      Reactions   Shellfish-derived Products Swelling, Anaphylaxis   Pineapple Swelling, Other (See Comments)   Stomach  upset      Medication List    STOP taking these medications   HYDROcodone-acetaminophen 10-325 MG tablet Commonly known as:  NORCO     TAKE these medications   allopurinol 100 MG tablet Commonly known as:  ZYLOPRIM Take 200 mg by mouth daily.   ARIPiprazole 10 MG tablet Commonly known as:  ABILIFY Take 10 mg by mouth daily.   atenolol 25 MG tablet Commonly known as:  TENORMIN Take 25 mg by mouth every evening.   buPROPion 300 MG 24 hr tablet Commonly known as:  WELLBUTRIN XL Take 300 mg by mouth every morning.   docusate sodium 100 MG capsule Commonly known as:  COLACE Take 1 capsule (100 mg total) by mouth 2 (two) times daily.   furosemide 20 MG tablet Commonly known as:   LASIX Take 20 mg by mouth daily as needed for fluid.   gabapentin 600 MG tablet Commonly known as:  NEURONTIN Take 600 mg by mouth 3 (three) times daily.   midodrine 2.5 MG tablet Commonly known as:  PROAMATINE Take 2.5 mg by mouth every evening.   NEXIUM 24HR 20 MG capsule Generic drug:  esomeprazole Take 20 mg by mouth daily before breakfast.   REFRESH 1.4-0.6 % ophthalmic solution Generic drug:  polyvinyl alcohol-povidone Place 1-2 drops into both eyes as needed (for dry eyes).   sertraline 100 MG tablet Commonly known as:  ZOLOFT Take 150 mg by mouth every morning.   simvastatin 20 MG tablet Commonly known as:  ZOCOR Take 20 mg by mouth every evening.   traMADol 50 MG tablet Commonly known as:  ULTRAM Take 2 tablets (100 mg total) by mouth every 6 (six) hours as needed for moderate pain.   white petrolatum Gel Commonly known as:  VASELINE Apply 1 application topically daily.   XARELTO 20 MG Tabs tablet Generic drug:  rivaroxaban Take 20 mg by mouth daily with supper.            Durable Medical Equipment        Start     Ordered   08/06/16 0700  For home use only DME oxygen  Once    Question Answer Comment  Liters per Minute 2   Frequency Continuous (stationary and portable oxygen unit needed)   Oxygen delivery system Gas      08/05/16 1541     Follow-up [place] Follow up.   Why:  Home oxygen. Portable tank to be delivered to room prior to DC.  Contact information: [address] [place] [address] [phone]           Signed: Ophelia Charter 08/07/2016, 9:02 AM

## 2016-09-07 DIAGNOSIS — M5416 Radiculopathy, lumbar region: Secondary | ICD-10-CM | POA: Diagnosis not present

## 2016-09-12 ENCOUNTER — Other Ambulatory Visit (HOSPITAL_BASED_OUTPATIENT_CLINIC_OR_DEPARTMENT_OTHER): Payer: Self-pay

## 2016-09-12 DIAGNOSIS — R5383 Other fatigue: Secondary | ICD-10-CM

## 2016-09-12 DIAGNOSIS — J9601 Acute respiratory failure with hypoxia: Secondary | ICD-10-CM

## 2016-09-12 DIAGNOSIS — G4730 Sleep apnea, unspecified: Secondary | ICD-10-CM

## 2016-09-14 ENCOUNTER — Ambulatory Visit (HOSPITAL_BASED_OUTPATIENT_CLINIC_OR_DEPARTMENT_OTHER): Payer: PPO | Attending: Physician Assistant | Admitting: Internal Medicine

## 2016-09-14 DIAGNOSIS — G4733 Obstructive sleep apnea (adult) (pediatric): Secondary | ICD-10-CM | POA: Insufficient documentation

## 2016-09-14 DIAGNOSIS — G4730 Sleep apnea, unspecified: Secondary | ICD-10-CM

## 2016-09-14 DIAGNOSIS — R5383 Other fatigue: Secondary | ICD-10-CM | POA: Diagnosis not present

## 2016-09-18 DIAGNOSIS — G4730 Sleep apnea, unspecified: Secondary | ICD-10-CM | POA: Diagnosis not present

## 2016-09-18 NOTE — Procedures (Signed)
  Patient Name: [name], Kerin Date: 09/14/2016 Gender: Female D.O.B: 01/02/1956 Age (years): 60 Referring Provider: Valaria Good PA-C Height (inches): 63 Interpreting Physician: Baird Lyons MD, ABSM Weight (lbs): 250 RPSGT: Earney [place] BMI: 44 MRN: [id number] Neck Size: 17.75 CLINICAL INFORMATION Sleep Study Type: NPSG  Indication for sleep study: Fatigue  Epworth Sleepiness Score: 7  SLEEP STUDY TECHNIQUE As per the AASM Manual for the Scoring of Sleep and Associated Events v2.3 (April 2016) with a hypopnea requiring 4% desaturations.  The channels recorded and monitored were frontal, central and occipital EEG, electrooculogram (EOG), submentalis EMG (chin), nasal and oral airflow, thoracic and abdominal wall motion, anterior tibialis EMG, snore microphone, electrocardiogram, and pulse oximetry.  MEDICATIONS Medications self-administered by patient taken the night of the study : ATENOLOL, NORCO, SIMVASTATIN, FLEXORIL  SLEEP ARCHITECTURE The study was initiated at 10:15:49 PM and ended at 4:33:17 AM.  Sleep onset time was 12.4 minutes and the sleep efficiency was 84.0%. The total sleep time was 317.1 minutes.  Stage REM latency was 203.0 minutes.  The patient spent 0.79% of the night in stage N1 sleep, 94.01% in stage N2 sleep, 0.00% in stage N3 and 5.20% in REM.  Alpha intrusion was absent.  Supine sleep was 0.00%.  RESPIRATORY PARAMETERS The overall apnea/hypopnea index (AHI) was 2.1 per hour. There were 2 total apneas, including 2 obstructive, 0 central and 0 mixed apneas. There were 9 hypopneas and 21 RERAs.  The AHI during Stage REM sleep was 0.0 per hour.  AHI while supine was N/A per hour.  The mean oxygen saturation was 97.46%. The minimum SpO2 during sleep was 92.00%. Supplemental O2 2L/ min as instructed per ordering provider.  Loud snoring was noted during this study.  CARDIAC DATA The 2 lead EKG demonstrated sinus rhythm. The mean  heart rate was 78.33 beats per minute. Other EKG findings include: None.  LEG MOVEMENT DATA The total PLMS were 0 with a resulting PLMS index of 0.00. Associated arousal with leg movement index was 0.0 .  IMPRESSIONS - No significant obstructive sleep apnea occurred during this study (AHI = 2.1/h). - No significant central sleep apnea occurred during this study (CAI = 0.0/h). - Oxygen desaturation was not noted during this study (Min O2 = 92.00%). Patient wore supplemental O2 2L/ min. - The patient snored with Loud snoring volume. Mouth breathing through-out. - No cardiac abnormalities were noted during this study. - Clinically significant periodic limb movements did not occur during sleep. No significant associated arousals.  DIAGNOSIS - Nocturnal Hypoxemia (327.26 [G47.36 ICD-10]) - Upper Airway Resistance syndrome  RECOMMENDATIONS - Be careful with alcohol, sedatives and other CNS depressants that may worsen sleep apnea and disrupt normal sleep architecture.  - Sleep hygiene should be reviewed to assess factors that may improve sleep quality. - Weight management and regular exercise should be initiated or continued if appropriate. - Consider a chin strap- preventing mouth breathing, if tolerated, may reduce snoring.  [Electronically signed] 09/18/2016 12:21 PM  Baird Lyons MD, [place], American Board of Sleep Medicine   NPI: [id number]  [employer], American Board of Sleep Medicine  ELECTRONICALLY SIGNED ON:  09/18/2016, 12:16 PM [place] PH: [phone]   FX: [phone] [place]

## 2016-09-19 DIAGNOSIS — Z1231 Encounter for screening mammogram for malignant neoplasm of breast: Secondary | ICD-10-CM | POA: Diagnosis not present

## 2016-09-19 DIAGNOSIS — M4120 Other idiopathic scoliosis, site unspecified: Secondary | ICD-10-CM | POA: Diagnosis not present

## 2016-10-12 DIAGNOSIS — Z5181 Encounter for therapeutic drug level monitoring: Secondary | ICD-10-CM | POA: Diagnosis not present

## 2016-10-12 DIAGNOSIS — R0902 Hypoxemia: Secondary | ICD-10-CM | POA: Diagnosis not present

## 2016-10-12 DIAGNOSIS — M5137 Other intervertebral disc degeneration, lumbosacral region: Secondary | ICD-10-CM | POA: Diagnosis not present

## 2016-10-12 DIAGNOSIS — M1711 Unilateral primary osteoarthritis, right knee: Secondary | ICD-10-CM | POA: Diagnosis not present

## 2016-10-17 ENCOUNTER — Encounter: Payer: Self-pay | Admitting: Neurology

## 2016-11-08 DIAGNOSIS — M545 Low back pain: Secondary | ICD-10-CM | POA: Diagnosis not present

## 2016-11-16 DIAGNOSIS — E119 Type 2 diabetes mellitus without complications: Secondary | ICD-10-CM | POA: Diagnosis not present

## 2016-11-16 DIAGNOSIS — E559 Vitamin D deficiency, unspecified: Secondary | ICD-10-CM | POA: Diagnosis not present

## 2016-11-16 DIAGNOSIS — L570 Actinic keratosis: Secondary | ICD-10-CM | POA: Diagnosis not present

## 2016-11-16 DIAGNOSIS — Z23 Encounter for immunization: Secondary | ICD-10-CM | POA: Diagnosis not present

## 2016-11-16 DIAGNOSIS — K219 Gastro-esophageal reflux disease without esophagitis: Secondary | ICD-10-CM | POA: Diagnosis not present

## 2016-11-16 DIAGNOSIS — E038 Other specified hypothyroidism: Secondary | ICD-10-CM | POA: Diagnosis not present

## 2016-11-16 DIAGNOSIS — Z5181 Encounter for therapeutic drug level monitoring: Secondary | ICD-10-CM | POA: Diagnosis not present

## 2016-11-16 DIAGNOSIS — I10 Essential (primary) hypertension: Secondary | ICD-10-CM | POA: Diagnosis not present

## 2016-11-16 DIAGNOSIS — E782 Mixed hyperlipidemia: Secondary | ICD-10-CM | POA: Diagnosis not present

## 2016-11-16 DIAGNOSIS — G894 Chronic pain syndrome: Secondary | ICD-10-CM | POA: Diagnosis not present

## 2016-11-16 DIAGNOSIS — D518 Other vitamin B12 deficiency anemias: Secondary | ICD-10-CM | POA: Diagnosis not present

## 2016-11-21 DIAGNOSIS — J9600 Acute respiratory failure, unspecified whether with hypoxia or hypercapnia: Secondary | ICD-10-CM | POA: Diagnosis not present

## 2016-11-21 DIAGNOSIS — G4723 Circadian rhythm sleep disorder, irregular sleep wake type: Secondary | ICD-10-CM | POA: Diagnosis not present

## 2016-11-23 DIAGNOSIS — D649 Anemia, unspecified: Secondary | ICD-10-CM | POA: Diagnosis not present

## 2016-12-09 DIAGNOSIS — I10 Essential (primary) hypertension: Secondary | ICD-10-CM | POA: Diagnosis not present

## 2016-12-09 DIAGNOSIS — D638 Anemia in other chronic diseases classified elsewhere: Secondary | ICD-10-CM | POA: Diagnosis not present

## 2016-12-09 DIAGNOSIS — K219 Gastro-esophageal reflux disease without esophagitis: Secondary | ICD-10-CM | POA: Diagnosis not present

## 2016-12-09 DIAGNOSIS — E782 Mixed hyperlipidemia: Secondary | ICD-10-CM | POA: Diagnosis not present

## 2016-12-09 DIAGNOSIS — G43009 Migraine without aura, not intractable, without status migrainosus: Secondary | ICD-10-CM | POA: Diagnosis not present

## 2016-12-21 DIAGNOSIS — J9600 Acute respiratory failure, unspecified whether with hypoxia or hypercapnia: Secondary | ICD-10-CM | POA: Diagnosis not present

## 2016-12-21 DIAGNOSIS — G4723 Circadian rhythm sleep disorder, irregular sleep wake type: Secondary | ICD-10-CM | POA: Diagnosis not present

## 2017-01-09 DIAGNOSIS — E782 Mixed hyperlipidemia: Secondary | ICD-10-CM | POA: Diagnosis not present

## 2017-01-09 DIAGNOSIS — G894 Chronic pain syndrome: Secondary | ICD-10-CM | POA: Diagnosis not present

## 2017-01-09 DIAGNOSIS — Z79899 Other long term (current) drug therapy: Secondary | ICD-10-CM | POA: Diagnosis not present

## 2017-01-09 DIAGNOSIS — I10 Essential (primary) hypertension: Secondary | ICD-10-CM | POA: Diagnosis not present

## 2017-01-09 DIAGNOSIS — K219 Gastro-esophageal reflux disease without esophagitis: Secondary | ICD-10-CM | POA: Diagnosis not present

## 2017-01-11 DIAGNOSIS — R0989 Other specified symptoms and signs involving the circulatory and respiratory systems: Secondary | ICD-10-CM | POA: Diagnosis not present

## 2017-01-11 DIAGNOSIS — R011 Cardiac murmur, unspecified: Secondary | ICD-10-CM | POA: Diagnosis not present

## 2017-01-21 DIAGNOSIS — J9600 Acute respiratory failure, unspecified whether with hypoxia or hypercapnia: Secondary | ICD-10-CM | POA: Diagnosis not present

## 2017-01-21 DIAGNOSIS — G4723 Circadian rhythm sleep disorder, irregular sleep wake type: Secondary | ICD-10-CM | POA: Diagnosis not present

## 2017-01-30 ENCOUNTER — Ambulatory Visit: Payer: PPO | Admitting: Neurology

## 2017-02-09 DIAGNOSIS — K219 Gastro-esophageal reflux disease without esophagitis: Secondary | ICD-10-CM | POA: Diagnosis not present

## 2017-02-09 DIAGNOSIS — E782 Mixed hyperlipidemia: Secondary | ICD-10-CM | POA: Diagnosis not present

## 2017-02-09 DIAGNOSIS — R197 Diarrhea, unspecified: Secondary | ICD-10-CM | POA: Diagnosis not present

## 2017-02-09 DIAGNOSIS — I10 Essential (primary) hypertension: Secondary | ICD-10-CM | POA: Diagnosis not present

## 2017-02-21 DIAGNOSIS — G4723 Circadian rhythm sleep disorder, irregular sleep wake type: Secondary | ICD-10-CM | POA: Diagnosis not present

## 2017-02-21 DIAGNOSIS — J9600 Acute respiratory failure, unspecified whether with hypoxia or hypercapnia: Secondary | ICD-10-CM | POA: Diagnosis not present

## 2017-02-28 DIAGNOSIS — E876 Hypokalemia: Secondary | ICD-10-CM | POA: Diagnosis not present

## 2017-02-28 DIAGNOSIS — F329 Major depressive disorder, single episode, unspecified: Secondary | ICD-10-CM | POA: Diagnosis not present

## 2017-02-28 DIAGNOSIS — E785 Hyperlipidemia, unspecified: Secondary | ICD-10-CM | POA: Diagnosis not present

## 2017-02-28 DIAGNOSIS — R0602 Shortness of breath: Secondary | ICD-10-CM | POA: Diagnosis not present

## 2017-02-28 DIAGNOSIS — R197 Diarrhea, unspecified: Secondary | ICD-10-CM | POA: Diagnosis not present

## 2017-02-28 DIAGNOSIS — Z79899 Other long term (current) drug therapy: Secondary | ICD-10-CM | POA: Diagnosis not present

## 2017-02-28 DIAGNOSIS — G43009 Migraine without aura, not intractable, without status migrainosus: Secondary | ICD-10-CM | POA: Diagnosis not present

## 2017-02-28 DIAGNOSIS — M797 Fibromyalgia: Secondary | ICD-10-CM | POA: Diagnosis not present

## 2017-02-28 DIAGNOSIS — E782 Mixed hyperlipidemia: Secondary | ICD-10-CM | POA: Diagnosis not present

## 2017-02-28 DIAGNOSIS — D649 Anemia, unspecified: Secondary | ICD-10-CM | POA: Diagnosis not present

## 2017-02-28 DIAGNOSIS — N390 Urinary tract infection, site not specified: Secondary | ICD-10-CM | POA: Diagnosis not present

## 2017-02-28 DIAGNOSIS — Z86711 Personal history of pulmonary embolism: Secondary | ICD-10-CM | POA: Diagnosis not present

## 2017-02-28 DIAGNOSIS — R42 Dizziness and giddiness: Secondary | ICD-10-CM | POA: Diagnosis not present

## 2017-02-28 DIAGNOSIS — K219 Gastro-esophageal reflux disease without esophagitis: Secondary | ICD-10-CM | POA: Diagnosis not present

## 2017-02-28 DIAGNOSIS — M109 Gout, unspecified: Secondary | ICD-10-CM | POA: Diagnosis not present

## 2017-02-28 DIAGNOSIS — R531 Weakness: Secondary | ICD-10-CM | POA: Diagnosis not present

## 2017-02-28 DIAGNOSIS — I10 Essential (primary) hypertension: Secondary | ICD-10-CM | POA: Diagnosis not present

## 2017-02-28 DIAGNOSIS — G510 Bell's palsy: Secondary | ICD-10-CM | POA: Diagnosis not present

## 2017-02-28 DIAGNOSIS — Z7901 Long term (current) use of anticoagulants: Secondary | ICD-10-CM | POA: Diagnosis not present

## 2017-03-01 DIAGNOSIS — R531 Weakness: Secondary | ICD-10-CM | POA: Diagnosis not present

## 2017-03-01 DIAGNOSIS — I10 Essential (primary) hypertension: Secondary | ICD-10-CM | POA: Diagnosis not present

## 2017-03-07 ENCOUNTER — Other Ambulatory Visit: Payer: Self-pay

## 2017-03-07 DIAGNOSIS — E782 Mixed hyperlipidemia: Secondary | ICD-10-CM | POA: Diagnosis not present

## 2017-03-07 DIAGNOSIS — D638 Anemia in other chronic diseases classified elsewhere: Secondary | ICD-10-CM | POA: Diagnosis not present

## 2017-03-07 DIAGNOSIS — K219 Gastro-esophageal reflux disease without esophagitis: Secondary | ICD-10-CM | POA: Diagnosis not present

## 2017-03-07 DIAGNOSIS — I10 Essential (primary) hypertension: Secondary | ICD-10-CM | POA: Diagnosis not present

## 2017-03-07 DIAGNOSIS — G43009 Migraine without aura, not intractable, without status migrainosus: Secondary | ICD-10-CM | POA: Diagnosis not present

## 2017-03-07 NOTE — Patient Outreach (Signed)
[facility]) Care Management  03/07/2017  Serenity MCKAYLIE VASEY October 13, 1955 [id number]  [employer] referral: client discharged from [facility] on 03/01/17.   Assessment: 62 year old with history of GERD, bell palsy, pulmonary embolism, lumbar scoliosis. RNCM called for transition of care.  She reports she was hospitalized due to anemia. She states she received two units of blood while in the hospital. She reports she has all her medications and went to her follow up appointment with primary care today.   RNCM discussed [place] Management program. She denies any care management needs and declines [facility] Care Management services at this time.   RNCM provided contact number and encouraged client to call as needed.  Plan: will not open case. Will send [place] management brochure.  Thea Silversmith, RN, MSN, [place] Coordinator Cell: [phone]

## 2017-03-08 DIAGNOSIS — R42 Dizziness and giddiness: Secondary | ICD-10-CM | POA: Diagnosis not present

## 2017-03-08 DIAGNOSIS — I6501 Occlusion and stenosis of right vertebral artery: Secondary | ICD-10-CM | POA: Diagnosis not present

## 2017-03-14 DIAGNOSIS — D649 Anemia, unspecified: Secondary | ICD-10-CM | POA: Diagnosis not present

## 2017-03-14 DIAGNOSIS — N289 Disorder of kidney and ureter, unspecified: Secondary | ICD-10-CM | POA: Diagnosis not present

## 2017-03-14 DIAGNOSIS — R718 Other abnormality of red blood cells: Secondary | ICD-10-CM | POA: Diagnosis not present

## 2017-03-15 DIAGNOSIS — G43009 Migraine without aura, not intractable, without status migrainosus: Secondary | ICD-10-CM | POA: Diagnosis not present

## 2017-03-15 DIAGNOSIS — I10 Essential (primary) hypertension: Secondary | ICD-10-CM | POA: Diagnosis not present

## 2017-03-15 DIAGNOSIS — N390 Urinary tract infection, site not specified: Secondary | ICD-10-CM | POA: Diagnosis not present

## 2017-03-15 DIAGNOSIS — E782 Mixed hyperlipidemia: Secondary | ICD-10-CM | POA: Diagnosis not present

## 2017-03-15 DIAGNOSIS — K219 Gastro-esophageal reflux disease without esophagitis: Secondary | ICD-10-CM | POA: Diagnosis not present

## 2017-03-21 DIAGNOSIS — J9600 Acute respiratory failure, unspecified whether with hypoxia or hypercapnia: Secondary | ICD-10-CM | POA: Diagnosis not present

## 2017-03-21 DIAGNOSIS — G4723 Circadian rhythm sleep disorder, irregular sleep wake type: Secondary | ICD-10-CM | POA: Diagnosis not present

## 2017-03-27 DIAGNOSIS — D649 Anemia, unspecified: Secondary | ICD-10-CM | POA: Diagnosis not present

## 2017-03-27 DIAGNOSIS — N289 Disorder of kidney and ureter, unspecified: Secondary | ICD-10-CM | POA: Diagnosis not present

## 2017-03-28 DIAGNOSIS — I10 Essential (primary) hypertension: Secondary | ICD-10-CM | POA: Diagnosis not present

## 2017-03-28 DIAGNOSIS — E782 Mixed hyperlipidemia: Secondary | ICD-10-CM | POA: Diagnosis not present

## 2017-03-28 DIAGNOSIS — Z79899 Other long term (current) drug therapy: Secondary | ICD-10-CM | POA: Diagnosis not present

## 2017-03-28 DIAGNOSIS — Z5181 Encounter for therapeutic drug level monitoring: Secondary | ICD-10-CM | POA: Diagnosis not present

## 2017-03-28 DIAGNOSIS — N3090 Cystitis, unspecified without hematuria: Secondary | ICD-10-CM | POA: Diagnosis not present

## 2017-03-28 DIAGNOSIS — K219 Gastro-esophageal reflux disease without esophagitis: Secondary | ICD-10-CM | POA: Diagnosis not present

## 2017-03-28 DIAGNOSIS — G43009 Migraine without aura, not intractable, without status migrainosus: Secondary | ICD-10-CM | POA: Diagnosis not present

## 2017-04-04 ENCOUNTER — Other Ambulatory Visit (HOSPITAL_COMMUNITY)
Admission: RE | Admit: 2017-04-04 | Disposition: A | Discharge status: Home / Self Care | Payer: PPO | Source: Ambulatory Visit | Attending: Oncology | Admitting: Oncology

## 2017-04-04 DIAGNOSIS — D7589 Other specified diseases of blood and blood-forming organs: Secondary | ICD-10-CM | POA: Diagnosis not present

## 2017-04-04 DIAGNOSIS — D509 Iron deficiency anemia, unspecified: Secondary | ICD-10-CM | POA: Insufficient documentation

## 2017-04-04 DIAGNOSIS — D539 Nutritional anemia, unspecified: Secondary | ICD-10-CM | POA: Diagnosis not present

## 2017-04-04 DIAGNOSIS — D464 Refractory anemia, unspecified: Secondary | ICD-10-CM | POA: Diagnosis not present

## 2017-04-17 ENCOUNTER — Encounter (HOSPITAL_COMMUNITY): Payer: Self-pay | Admitting: Oncology

## 2017-04-17 LAB — TISSUE HYBRIDIZATION (BONE MARROW)-NCBH

## 2017-04-17 LAB — CHROMOSOME ANALYSIS, BONE MARROW

## 2017-04-18 DIAGNOSIS — D46C Myelodysplastic syndrome with isolated del(5q) chromosomal abnormality: Secondary | ICD-10-CM | POA: Diagnosis not present

## 2017-04-21 DIAGNOSIS — J9600 Acute respiratory failure, unspecified whether with hypoxia or hypercapnia: Secondary | ICD-10-CM | POA: Diagnosis not present

## 2017-04-21 DIAGNOSIS — G4723 Circadian rhythm sleep disorder, irregular sleep wake type: Secondary | ICD-10-CM | POA: Diagnosis not present

## 2017-04-27 DIAGNOSIS — M1712 Unilateral primary osteoarthritis, left knee: Secondary | ICD-10-CM | POA: Diagnosis not present

## 2017-04-27 DIAGNOSIS — D46C Myelodysplastic syndrome with isolated del(5q) chromosomal abnormality: Secondary | ICD-10-CM | POA: Diagnosis not present

## 2017-04-28 DIAGNOSIS — G43009 Migraine without aura, not intractable, without status migrainosus: Secondary | ICD-10-CM | POA: Diagnosis not present

## 2017-04-28 DIAGNOSIS — D638 Anemia in other chronic diseases classified elsewhere: Secondary | ICD-10-CM | POA: Diagnosis not present

## 2017-04-28 DIAGNOSIS — R300 Dysuria: Secondary | ICD-10-CM | POA: Diagnosis not present

## 2017-04-28 DIAGNOSIS — I10 Essential (primary) hypertension: Secondary | ICD-10-CM | POA: Diagnosis not present

## 2017-04-28 DIAGNOSIS — K219 Gastro-esophageal reflux disease without esophagitis: Secondary | ICD-10-CM | POA: Diagnosis not present

## 2017-04-28 DIAGNOSIS — Z79899 Other long term (current) drug therapy: Secondary | ICD-10-CM | POA: Diagnosis not present

## 2017-04-28 DIAGNOSIS — Z5181 Encounter for therapeutic drug level monitoring: Secondary | ICD-10-CM | POA: Diagnosis not present

## 2017-04-28 DIAGNOSIS — E782 Mixed hyperlipidemia: Secondary | ICD-10-CM | POA: Diagnosis not present

## 2017-05-16 ENCOUNTER — Encounter

## 2017-05-16 ENCOUNTER — Ambulatory Visit: Payer: PPO | Admitting: Neurology

## 2017-05-21 DIAGNOSIS — G4723 Circadian rhythm sleep disorder, irregular sleep wake type: Secondary | ICD-10-CM | POA: Diagnosis not present

## 2017-05-21 DIAGNOSIS — J9600 Acute respiratory failure, unspecified whether with hypoxia or hypercapnia: Secondary | ICD-10-CM | POA: Diagnosis not present

## 2017-05-25 DIAGNOSIS — D649 Anemia, unspecified: Secondary | ICD-10-CM | POA: Diagnosis not present

## 2017-06-02 DIAGNOSIS — G43009 Migraine without aura, not intractable, without status migrainosus: Secondary | ICD-10-CM | POA: Diagnosis not present

## 2017-06-02 DIAGNOSIS — D46C Myelodysplastic syndrome with isolated del(5q) chromosomal abnormality: Secondary | ICD-10-CM | POA: Diagnosis not present

## 2017-06-02 DIAGNOSIS — E782 Mixed hyperlipidemia: Secondary | ICD-10-CM | POA: Diagnosis not present

## 2017-06-02 DIAGNOSIS — I10 Essential (primary) hypertension: Secondary | ICD-10-CM | POA: Diagnosis not present

## 2017-06-02 DIAGNOSIS — Z5181 Encounter for therapeutic drug level monitoring: Secondary | ICD-10-CM | POA: Diagnosis not present

## 2017-06-02 DIAGNOSIS — K219 Gastro-esophageal reflux disease without esophagitis: Secondary | ICD-10-CM | POA: Diagnosis not present

## 2017-06-08 DIAGNOSIS — R7989 Other specified abnormal findings of blood chemistry: Secondary | ICD-10-CM | POA: Diagnosis not present

## 2017-06-08 DIAGNOSIS — D46C Myelodysplastic syndrome with isolated del(5q) chromosomal abnormality: Secondary | ICD-10-CM | POA: Diagnosis not present

## 2017-06-08 DIAGNOSIS — D72819 Decreased white blood cell count, unspecified: Secondary | ICD-10-CM | POA: Diagnosis not present

## 2017-06-08 DIAGNOSIS — Z79899 Other long term (current) drug therapy: Secondary | ICD-10-CM | POA: Diagnosis not present

## 2017-06-13 DIAGNOSIS — D46C Myelodysplastic syndrome with isolated del(5q) chromosomal abnormality: Secondary | ICD-10-CM | POA: Diagnosis not present

## 2017-06-15 DIAGNOSIS — M25512 Pain in left shoulder: Secondary | ICD-10-CM | POA: Diagnosis not present

## 2017-06-15 DIAGNOSIS — D46C Myelodysplastic syndrome with isolated del(5q) chromosomal abnormality: Secondary | ICD-10-CM | POA: Diagnosis not present

## 2017-06-15 DIAGNOSIS — G8929 Other chronic pain: Secondary | ICD-10-CM | POA: Diagnosis not present

## 2017-06-15 DIAGNOSIS — M25511 Pain in right shoulder: Secondary | ICD-10-CM | POA: Diagnosis not present

## 2017-06-21 DIAGNOSIS — G4723 Circadian rhythm sleep disorder, irregular sleep wake type: Secondary | ICD-10-CM | POA: Diagnosis not present

## 2017-06-21 DIAGNOSIS — J9600 Acute respiratory failure, unspecified whether with hypoxia or hypercapnia: Secondary | ICD-10-CM | POA: Diagnosis not present

## 2017-06-28 DIAGNOSIS — D649 Anemia, unspecified: Secondary | ICD-10-CM | POA: Diagnosis not present

## 2017-06-28 DIAGNOSIS — D46C Myelodysplastic syndrome with isolated del(5q) chromosomal abnormality: Secondary | ICD-10-CM | POA: Diagnosis not present

## 2017-06-28 DIAGNOSIS — R109 Unspecified abdominal pain: Secondary | ICD-10-CM | POA: Diagnosis not present

## 2017-06-28 DIAGNOSIS — R300 Dysuria: Secondary | ICD-10-CM | POA: Diagnosis not present

## 2017-06-29 DIAGNOSIS — R109 Unspecified abdominal pain: Secondary | ICD-10-CM | POA: Diagnosis not present

## 2017-06-29 DIAGNOSIS — R300 Dysuria: Secondary | ICD-10-CM | POA: Diagnosis not present

## 2017-06-29 DIAGNOSIS — D46C Myelodysplastic syndrome with isolated del(5q) chromosomal abnormality: Secondary | ICD-10-CM | POA: Diagnosis not present

## 2017-06-29 DIAGNOSIS — D649 Anemia, unspecified: Secondary | ICD-10-CM | POA: Diagnosis not present

## 2017-07-04 DIAGNOSIS — Z5181 Encounter for therapeutic drug level monitoring: Secondary | ICD-10-CM | POA: Diagnosis not present

## 2017-07-04 DIAGNOSIS — D46C Myelodysplastic syndrome with isolated del(5q) chromosomal abnormality: Secondary | ICD-10-CM | POA: Diagnosis not present

## 2017-07-04 DIAGNOSIS — K219 Gastro-esophageal reflux disease without esophagitis: Secondary | ICD-10-CM | POA: Diagnosis not present

## 2017-07-04 DIAGNOSIS — D72819 Decreased white blood cell count, unspecified: Secondary | ICD-10-CM | POA: Diagnosis not present

## 2017-07-04 DIAGNOSIS — E782 Mixed hyperlipidemia: Secondary | ICD-10-CM | POA: Diagnosis not present

## 2017-07-04 DIAGNOSIS — I10 Essential (primary) hypertension: Secondary | ICD-10-CM | POA: Diagnosis not present

## 2017-07-04 DIAGNOSIS — D6959 Other secondary thrombocytopenia: Secondary | ICD-10-CM | POA: Diagnosis not present

## 2017-07-04 DIAGNOSIS — G894 Chronic pain syndrome: Secondary | ICD-10-CM | POA: Diagnosis not present

## 2017-07-04 DIAGNOSIS — Z79899 Other long term (current) drug therapy: Secondary | ICD-10-CM | POA: Diagnosis not present

## 2017-07-21 DIAGNOSIS — G4723 Circadian rhythm sleep disorder, irregular sleep wake type: Secondary | ICD-10-CM | POA: Diagnosis not present

## 2017-07-21 DIAGNOSIS — J9600 Acute respiratory failure, unspecified whether with hypoxia or hypercapnia: Secondary | ICD-10-CM | POA: Diagnosis not present

## 2017-07-24 ENCOUNTER — Ambulatory Visit: Payer: PPO | Admitting: Neurology

## 2017-07-26 DIAGNOSIS — I10 Essential (primary) hypertension: Secondary | ICD-10-CM | POA: Diagnosis not present

## 2017-07-26 DIAGNOSIS — G43009 Migraine without aura, not intractable, without status migrainosus: Secondary | ICD-10-CM | POA: Diagnosis not present

## 2017-07-26 DIAGNOSIS — D518 Other vitamin B12 deficiency anemias: Secondary | ICD-10-CM | POA: Diagnosis not present

## 2017-07-26 DIAGNOSIS — K219 Gastro-esophageal reflux disease without esophagitis: Secondary | ICD-10-CM | POA: Diagnosis not present

## 2017-07-26 DIAGNOSIS — Z5181 Encounter for therapeutic drug level monitoring: Secondary | ICD-10-CM | POA: Diagnosis not present

## 2017-07-26 DIAGNOSIS — E559 Vitamin D deficiency, unspecified: Secondary | ICD-10-CM | POA: Diagnosis not present

## 2017-07-26 DIAGNOSIS — E119 Type 2 diabetes mellitus without complications: Secondary | ICD-10-CM | POA: Diagnosis not present

## 2017-07-26 DIAGNOSIS — E038 Other specified hypothyroidism: Secondary | ICD-10-CM | POA: Diagnosis not present

## 2017-07-26 DIAGNOSIS — E782 Mixed hyperlipidemia: Secondary | ICD-10-CM | POA: Diagnosis not present

## 2017-08-02 ENCOUNTER — Ambulatory Visit: Payer: PPO | Admitting: Internal Medicine

## 2017-08-21 DIAGNOSIS — J9600 Acute respiratory failure, unspecified whether with hypoxia or hypercapnia: Secondary | ICD-10-CM | POA: Diagnosis not present

## 2017-08-21 DIAGNOSIS — G4723 Circadian rhythm sleep disorder, irregular sleep wake type: Secondary | ICD-10-CM | POA: Diagnosis not present

## 2017-08-23 DIAGNOSIS — Z5189 Encounter for other specified aftercare: Secondary | ICD-10-CM | POA: Diagnosis not present

## 2017-08-23 DIAGNOSIS — D46C Myelodysplastic syndrome with isolated del(5q) chromosomal abnormality: Secondary | ICD-10-CM | POA: Diagnosis not present

## 2017-08-24 DIAGNOSIS — D46C Myelodysplastic syndrome with isolated del(5q) chromosomal abnormality: Secondary | ICD-10-CM | POA: Diagnosis not present

## 2017-08-28 DIAGNOSIS — D46C Myelodysplastic syndrome with isolated del(5q) chromosomal abnormality: Secondary | ICD-10-CM | POA: Diagnosis not present

## 2017-08-29 DIAGNOSIS — D46C Myelodysplastic syndrome with isolated del(5q) chromosomal abnormality: Secondary | ICD-10-CM | POA: Diagnosis not present

## 2017-08-30 ENCOUNTER — Other Ambulatory Visit: Payer: Self-pay | Admitting: Pharmacist

## 2017-08-30 NOTE — Patient Outreach (Signed)
[place] [facility]) Care Management  08/30/2017  [name] 04/16/1955 [id number]   Incoming call from [employer] in response to the [facility] Medication Adherence Campaign. Speak with patient. HIPAA identifiers verified and verbal consent received.  [name] reports that she was recently diagnosed with cancer and started on Revlimid. Reports that she has had significant side effects from this medication, including effects on her memory and stomach. Reports that she has let her Oncologist know about the side effects. Encourage patient to continue to follow up closely with her Oncologist about any side effects that she is having.   Patient verbalizes understanding. Reports that she had a fall this week while in the clinic, but reports that she received help from the office staff. Reports that she often uses a cane to walk, but that she also has a walker at home. Encourage patient to always use a cane or walker to avoid falling. Patient reports that she has an appointment tomorrow with her PCP. Counsel patient to let her provider know about her falls and ask him to assess her walking/balacnce. Patient states that she will. Provide patient with phone number for Med Alert.  [name] reports that she takes her simvastatin nightly at bedtime, but that she may occasionally miss a dose. Counsel patient about medication adherence and encourage her to use a pillbox to organize her medications. Patient denies any missed doses of her Revlimid.  Counsel patient about the cost savings benefit of getting 90 day supplies of her medication through her insurance.  [name] denies any further medication questions/concerns at this time. Provide patient with my phone number. Will close pharmacy episode.  Harlow Asa, PharmD, [place] [facility] [phone]

## 2017-09-01 DIAGNOSIS — K219 Gastro-esophageal reflux disease without esophagitis: Secondary | ICD-10-CM | POA: Diagnosis not present

## 2017-09-01 DIAGNOSIS — G43009 Migraine without aura, not intractable, without status migrainosus: Secondary | ICD-10-CM | POA: Diagnosis not present

## 2017-09-01 DIAGNOSIS — Z5181 Encounter for therapeutic drug level monitoring: Secondary | ICD-10-CM | POA: Diagnosis not present

## 2017-09-01 DIAGNOSIS — E782 Mixed hyperlipidemia: Secondary | ICD-10-CM | POA: Diagnosis not present

## 2017-09-01 DIAGNOSIS — I10 Essential (primary) hypertension: Secondary | ICD-10-CM | POA: Diagnosis not present

## 2017-09-01 DIAGNOSIS — R531 Weakness: Secondary | ICD-10-CM | POA: Diagnosis not present

## 2017-09-01 DIAGNOSIS — D46C Myelodysplastic syndrome with isolated del(5q) chromosomal abnormality: Secondary | ICD-10-CM | POA: Diagnosis not present

## 2017-09-01 DIAGNOSIS — D638 Anemia in other chronic diseases classified elsewhere: Secondary | ICD-10-CM | POA: Diagnosis not present

## 2017-09-05 DIAGNOSIS — K136 Irritative hyperplasia of oral mucosa: Secondary | ICD-10-CM | POA: Diagnosis not present

## 2017-09-11 DIAGNOSIS — Z79899 Other long term (current) drug therapy: Secondary | ICD-10-CM | POA: Diagnosis not present

## 2017-09-11 DIAGNOSIS — D46C Myelodysplastic syndrome with isolated del(5q) chromosomal abnormality: Secondary | ICD-10-CM | POA: Diagnosis not present

## 2017-09-15 DIAGNOSIS — M1990 Unspecified osteoarthritis, unspecified site: Secondary | ICD-10-CM | POA: Diagnosis not present

## 2017-09-15 DIAGNOSIS — M25522 Pain in left elbow: Secondary | ICD-10-CM | POA: Diagnosis not present

## 2017-09-15 DIAGNOSIS — E7800 Pure hypercholesterolemia, unspecified: Secondary | ICD-10-CM | POA: Diagnosis not present

## 2017-09-15 DIAGNOSIS — Z7901 Long term (current) use of anticoagulants: Secondary | ICD-10-CM | POA: Diagnosis not present

## 2017-09-15 DIAGNOSIS — R55 Syncope and collapse: Secondary | ICD-10-CM | POA: Diagnosis not present

## 2017-09-15 DIAGNOSIS — M25552 Pain in left hip: Secondary | ICD-10-CM | POA: Diagnosis not present

## 2017-09-15 DIAGNOSIS — Z86711 Personal history of pulmonary embolism: Secondary | ICD-10-CM | POA: Diagnosis not present

## 2017-09-15 DIAGNOSIS — G43909 Migraine, unspecified, not intractable, without status migrainosus: Secondary | ICD-10-CM | POA: Diagnosis not present

## 2017-09-15 DIAGNOSIS — M797 Fibromyalgia: Secondary | ICD-10-CM | POA: Diagnosis not present

## 2017-09-15 DIAGNOSIS — S79912A Unspecified injury of left hip, initial encounter: Secondary | ICD-10-CM | POA: Diagnosis not present

## 2017-09-15 DIAGNOSIS — I10 Essential (primary) hypertension: Secondary | ICD-10-CM | POA: Diagnosis not present

## 2017-09-15 DIAGNOSIS — E876 Hypokalemia: Secondary | ICD-10-CM | POA: Diagnosis not present

## 2017-09-15 DIAGNOSIS — Z8673 Personal history of transient ischemic attack (TIA), and cerebral infarction without residual deficits: Secondary | ICD-10-CM | POA: Diagnosis not present

## 2017-09-15 DIAGNOSIS — Z79899 Other long term (current) drug therapy: Secondary | ICD-10-CM | POA: Diagnosis not present

## 2017-09-19 ENCOUNTER — Encounter

## 2017-09-19 ENCOUNTER — Ambulatory Visit: Payer: PPO | Admitting: Internal Medicine

## 2017-09-21 DIAGNOSIS — J9600 Acute respiratory failure, unspecified whether with hypoxia or hypercapnia: Secondary | ICD-10-CM | POA: Diagnosis not present

## 2017-09-21 DIAGNOSIS — G4723 Circadian rhythm sleep disorder, irregular sleep wake type: Secondary | ICD-10-CM | POA: Diagnosis not present

## 2017-09-29 ENCOUNTER — Other Ambulatory Visit: Payer: Self-pay

## 2017-09-29 NOTE — Patient Outreach (Signed)
[place] [facility]) Care Management  09/29/2017  [name] [birth year] [id number]   Telephone Screen  Referral Date: 09/29/17 Referral Source: Nurse Call Center Referral Reason: " caller is having a lot of leg pain from hip to calf, fell on the 13th" Insurance: HTA   Outreach attempt # 1 to patient. Spoke with patient. She states that she "feels better some." She voices that she has been taking pain meds to help with pain and gets some relief after taking them. She is aware to seek medical attention if symptoms worsen and/or are unrelieved. She denies any RN CM needs or concerns at this time. Patient advised to contact 24hr nurse line for any future needs or concerns. She voiced understanding and was appreciative of call.     Plan: RN CM will close case at this time.    Enzo Montgomery, RN,BSN,CCM [place] Management Telephonic Care Management Coordinator Direct Phone: [phone] Toll Free: [phone] Fax: [phone]

## 2017-10-03 DIAGNOSIS — M1611 Unilateral primary osteoarthritis, right hip: Secondary | ICD-10-CM | POA: Diagnosis not present

## 2017-10-03 DIAGNOSIS — S79912A Unspecified injury of left hip, initial encounter: Secondary | ICD-10-CM | POA: Diagnosis not present

## 2017-10-03 DIAGNOSIS — R42 Dizziness and giddiness: Secondary | ICD-10-CM | POA: Diagnosis not present

## 2017-10-03 DIAGNOSIS — R102 Pelvic and perineal pain: Secondary | ICD-10-CM | POA: Diagnosis not present

## 2017-10-03 DIAGNOSIS — S3993XA Unspecified injury of pelvis, initial encounter: Secondary | ICD-10-CM | POA: Diagnosis not present

## 2017-10-03 DIAGNOSIS — M25552 Pain in left hip: Secondary | ICD-10-CM | POA: Diagnosis not present

## 2017-10-03 DIAGNOSIS — R52 Pain, unspecified: Secondary | ICD-10-CM | POA: Diagnosis not present

## 2017-10-03 DIAGNOSIS — M1612 Unilateral primary osteoarthritis, left hip: Secondary | ICD-10-CM | POA: Diagnosis not present

## 2017-10-03 DIAGNOSIS — M160 Bilateral primary osteoarthritis of hip: Secondary | ICD-10-CM | POA: Diagnosis not present

## 2017-10-03 DIAGNOSIS — T07XXXA Unspecified multiple injuries, initial encounter: Secondary | ICD-10-CM | POA: Diagnosis not present

## 2017-10-04 DIAGNOSIS — E876 Hypokalemia: Secondary | ICD-10-CM | POA: Diagnosis not present

## 2017-10-04 DIAGNOSIS — Z23 Encounter for immunization: Secondary | ICD-10-CM | POA: Diagnosis not present

## 2017-10-04 DIAGNOSIS — K219 Gastro-esophageal reflux disease without esophagitis: Secondary | ICD-10-CM | POA: Diagnosis not present

## 2017-10-04 DIAGNOSIS — I10 Essential (primary) hypertension: Secondary | ICD-10-CM | POA: Diagnosis not present

## 2017-10-04 DIAGNOSIS — E782 Mixed hyperlipidemia: Secondary | ICD-10-CM | POA: Diagnosis not present

## 2017-10-06 DIAGNOSIS — R350 Frequency of micturition: Secondary | ICD-10-CM | POA: Diagnosis not present

## 2017-10-10 DIAGNOSIS — D46C Myelodysplastic syndrome with isolated del(5q) chromosomal abnormality: Secondary | ICD-10-CM | POA: Diagnosis not present

## 2017-10-21 DIAGNOSIS — J9600 Acute respiratory failure, unspecified whether with hypoxia or hypercapnia: Secondary | ICD-10-CM | POA: Diagnosis not present

## 2017-10-21 DIAGNOSIS — G4723 Circadian rhythm sleep disorder, irregular sleep wake type: Secondary | ICD-10-CM | POA: Diagnosis not present

## 2017-10-25 DIAGNOSIS — Z79899 Other long term (current) drug therapy: Secondary | ICD-10-CM

## 2017-10-25 DIAGNOSIS — R197 Diarrhea, unspecified: Secondary | ICD-10-CM

## 2017-10-25 DIAGNOSIS — D46C Myelodysplastic syndrome with isolated del(5q) chromosomal abnormality: Secondary | ICD-10-CM

## 2017-10-25 DIAGNOSIS — E876 Hypokalemia: Secondary | ICD-10-CM

## 2017-10-31 DIAGNOSIS — Z5181 Encounter for therapeutic drug level monitoring: Secondary | ICD-10-CM | POA: Diagnosis not present

## 2017-10-31 DIAGNOSIS — G43009 Migraine without aura, not intractable, without status migrainosus: Secondary | ICD-10-CM | POA: Diagnosis not present

## 2017-10-31 DIAGNOSIS — E782 Mixed hyperlipidemia: Secondary | ICD-10-CM | POA: Diagnosis not present

## 2017-10-31 DIAGNOSIS — K219 Gastro-esophageal reflux disease without esophagitis: Secondary | ICD-10-CM | POA: Diagnosis not present

## 2017-10-31 DIAGNOSIS — I10 Essential (primary) hypertension: Secondary | ICD-10-CM | POA: Diagnosis not present

## 2017-11-06 DIAGNOSIS — H25013 Cortical age-related cataract, bilateral: Secondary | ICD-10-CM | POA: Diagnosis not present

## 2017-11-06 DIAGNOSIS — H35033 Hypertensive retinopathy, bilateral: Secondary | ICD-10-CM | POA: Diagnosis not present

## 2017-11-06 DIAGNOSIS — H40023 Open angle with borderline findings, high risk, bilateral: Secondary | ICD-10-CM | POA: Diagnosis not present

## 2017-11-06 DIAGNOSIS — H2513 Age-related nuclear cataract, bilateral: Secondary | ICD-10-CM | POA: Diagnosis not present

## 2017-11-21 DIAGNOSIS — G4723 Circadian rhythm sleep disorder, irregular sleep wake type: Secondary | ICD-10-CM | POA: Diagnosis not present

## 2017-11-21 DIAGNOSIS — J9600 Acute respiratory failure, unspecified whether with hypoxia or hypercapnia: Secondary | ICD-10-CM | POA: Diagnosis not present

## 2017-11-28 DIAGNOSIS — G43009 Migraine without aura, not intractable, without status migrainosus: Secondary | ICD-10-CM | POA: Diagnosis not present

## 2017-11-28 DIAGNOSIS — E782 Mixed hyperlipidemia: Secondary | ICD-10-CM | POA: Diagnosis not present

## 2017-11-28 DIAGNOSIS — I10 Essential (primary) hypertension: Secondary | ICD-10-CM | POA: Diagnosis not present

## 2017-11-28 DIAGNOSIS — K219 Gastro-esophageal reflux disease without esophagitis: Secondary | ICD-10-CM | POA: Diagnosis not present

## 2017-11-28 DIAGNOSIS — Z5181 Encounter for therapeutic drug level monitoring: Secondary | ICD-10-CM | POA: Diagnosis not present

## 2017-12-08 ENCOUNTER — Ambulatory Visit: Payer: PPO | Admitting: Neurology

## 2017-12-22 DIAGNOSIS — R1084 Generalized abdominal pain: Secondary | ICD-10-CM | POA: Diagnosis not present

## 2017-12-22 DIAGNOSIS — N200 Calculus of kidney: Secondary | ICD-10-CM | POA: Diagnosis not present

## 2017-12-22 DIAGNOSIS — R42 Dizziness and giddiness: Secondary | ICD-10-CM | POA: Diagnosis not present

## 2017-12-22 DIAGNOSIS — D469 Myelodysplastic syndrome, unspecified: Secondary | ICD-10-CM | POA: Diagnosis not present

## 2017-12-22 DIAGNOSIS — R197 Diarrhea, unspecified: Secondary | ICD-10-CM | POA: Diagnosis not present

## 2017-12-22 DIAGNOSIS — R52 Pain, unspecified: Secondary | ICD-10-CM | POA: Diagnosis not present

## 2017-12-22 DIAGNOSIS — E860 Dehydration: Secondary | ICD-10-CM | POA: Diagnosis not present

## 2017-12-22 DIAGNOSIS — E876 Hypokalemia: Secondary | ICD-10-CM | POA: Diagnosis not present

## 2017-12-22 DIAGNOSIS — R112 Nausea with vomiting, unspecified: Secondary | ICD-10-CM | POA: Diagnosis not present

## 2017-12-22 DIAGNOSIS — R110 Nausea: Secondary | ICD-10-CM | POA: Diagnosis not present

## 2017-12-25 DIAGNOSIS — R197 Diarrhea, unspecified: Secondary | ICD-10-CM | POA: Diagnosis not present

## 2017-12-25 DIAGNOSIS — E876 Hypokalemia: Secondary | ICD-10-CM | POA: Diagnosis not present

## 2017-12-25 DIAGNOSIS — E860 Dehydration: Secondary | ICD-10-CM | POA: Diagnosis not present

## 2017-12-25 DIAGNOSIS — D46C Myelodysplastic syndrome with isolated del(5q) chromosomal abnormality: Secondary | ICD-10-CM | POA: Diagnosis not present

## 2017-12-30 DIAGNOSIS — J9600 Acute respiratory failure, unspecified whether with hypoxia or hypercapnia: Secondary | ICD-10-CM | POA: Diagnosis not present

## 2017-12-30 DIAGNOSIS — G4723 Circadian rhythm sleep disorder, irregular sleep wake type: Secondary | ICD-10-CM | POA: Diagnosis not present

## 2018-01-08 DIAGNOSIS — D46C Myelodysplastic syndrome with isolated del(5q) chromosomal abnormality: Secondary | ICD-10-CM | POA: Diagnosis not present

## 2018-01-09 DIAGNOSIS — E782 Mixed hyperlipidemia: Secondary | ICD-10-CM | POA: Diagnosis not present

## 2018-01-09 DIAGNOSIS — G43009 Migraine without aura, not intractable, without status migrainosus: Secondary | ICD-10-CM | POA: Diagnosis not present

## 2018-01-09 DIAGNOSIS — I10 Essential (primary) hypertension: Secondary | ICD-10-CM | POA: Diagnosis not present

## 2018-01-09 DIAGNOSIS — K219 Gastro-esophageal reflux disease without esophagitis: Secondary | ICD-10-CM | POA: Diagnosis not present

## 2018-01-09 DIAGNOSIS — Z5181 Encounter for therapeutic drug level monitoring: Secondary | ICD-10-CM | POA: Diagnosis not present

## 2018-01-09 DIAGNOSIS — D46C Myelodysplastic syndrome with isolated del(5q) chromosomal abnormality: Secondary | ICD-10-CM | POA: Diagnosis not present

## 2018-01-17 DIAGNOSIS — E782 Mixed hyperlipidemia: Secondary | ICD-10-CM | POA: Diagnosis not present

## 2018-01-17 DIAGNOSIS — D649 Anemia, unspecified: Secondary | ICD-10-CM | POA: Diagnosis not present

## 2018-01-17 DIAGNOSIS — I10 Essential (primary) hypertension: Secondary | ICD-10-CM | POA: Diagnosis not present

## 2018-01-17 DIAGNOSIS — E559 Vitamin D deficiency, unspecified: Secondary | ICD-10-CM | POA: Diagnosis not present

## 2018-01-17 DIAGNOSIS — R531 Weakness: Secondary | ICD-10-CM | POA: Diagnosis not present

## 2018-01-17 DIAGNOSIS — I951 Orthostatic hypotension: Secondary | ICD-10-CM | POA: Diagnosis not present

## 2018-01-17 DIAGNOSIS — D469 Myelodysplastic syndrome, unspecified: Secondary | ICD-10-CM | POA: Diagnosis not present

## 2018-01-17 DIAGNOSIS — E119 Type 2 diabetes mellitus without complications: Secondary | ICD-10-CM | POA: Diagnosis not present

## 2018-01-17 DIAGNOSIS — R55 Syncope and collapse: Secondary | ICD-10-CM | POA: Diagnosis not present

## 2018-01-17 DIAGNOSIS — D61810 Antineoplastic chemotherapy induced pancytopenia: Secondary | ICD-10-CM | POA: Diagnosis not present

## 2018-01-17 DIAGNOSIS — S79911A Unspecified injury of right hip, initial encounter: Secondary | ICD-10-CM | POA: Diagnosis not present

## 2018-01-17 DIAGNOSIS — S199XXA Unspecified injury of neck, initial encounter: Secondary | ICD-10-CM | POA: Diagnosis not present

## 2018-01-17 DIAGNOSIS — D518 Other vitamin B12 deficiency anemias: Secondary | ICD-10-CM | POA: Diagnosis not present

## 2018-01-17 DIAGNOSIS — S069X9A Unspecified intracranial injury with loss of consciousness of unspecified duration, initial encounter: Secondary | ICD-10-CM | POA: Diagnosis not present

## 2018-01-17 DIAGNOSIS — E038 Other specified hypothyroidism: Secondary | ICD-10-CM | POA: Diagnosis not present

## 2018-01-17 DIAGNOSIS — S299XXA Unspecified injury of thorax, initial encounter: Secondary | ICD-10-CM | POA: Diagnosis not present

## 2018-01-18 DIAGNOSIS — D649 Anemia, unspecified: Secondary | ICD-10-CM | POA: Diagnosis not present

## 2018-01-18 DIAGNOSIS — F329 Major depressive disorder, single episode, unspecified: Secondary | ICD-10-CM | POA: Diagnosis not present

## 2018-01-18 DIAGNOSIS — I951 Orthostatic hypotension: Secondary | ICD-10-CM

## 2018-01-18 DIAGNOSIS — R55 Syncope and collapse: Secondary | ICD-10-CM | POA: Diagnosis not present

## 2018-01-18 DIAGNOSIS — E785 Hyperlipidemia, unspecified: Secondary | ICD-10-CM | POA: Diagnosis not present

## 2018-01-18 DIAGNOSIS — Z86711 Personal history of pulmonary embolism: Secondary | ICD-10-CM | POA: Diagnosis not present

## 2018-01-18 DIAGNOSIS — D61810 Antineoplastic chemotherapy induced pancytopenia: Secondary | ICD-10-CM

## 2018-01-22 ENCOUNTER — Other Ambulatory Visit: Payer: Self-pay | Admitting: *Deleted

## 2018-01-22 NOTE — Patient Outreach (Signed)
[facility] [facility]) Care Management  01/22/2018  [name] [birth year] [id number]   Transition of Care Referral   Referral Date: 01/22/2018 Referral Source: HTA IP discharge Date of Admission: 01/17/2018 Diagnosis: low potassium  Date of Discharge:on 01/18/18 Facility: [place]: HTA   Outreach attempt # 1 successful  Patient is able to verify HIPAA Reviewed and addressed Transitional of care referral with patient [name] reports doing better after her potassium was corrected during her hospital visit to [facility] health  She reports she will be restarting her chemotherapy after a break of "seven" weeks She reprorts her appetite has improved to 3 meals a day She states that while on Chemo her appetite was poor at one meal a day related to issues with taste She denies any care management needs and declines [facility] Care Management services at this time.  Transition of care assessment completed without noted needs or concerns  Social:[name] reports she has her husband She is independent with her care needs and transportation She gets assistance from her husband Rush Landmark and a good friend She discussed "dealing with" her cancer.  CM discussed [facility] SW services and resources but she denied the need of assistance as she reports being able to talk wth her friend. CM encouraged a return call prn   Conditions:GERD, bell palsy, pulmonary embolism, lumbar scoliosis, Major depressive disorder,   Medications: She denies concerns with taking medications as prescribed, affording medications, side effects of medications and questions about medications   Appointments:  Dr Jannette Fogo Tuesday 01/30/2018  Goes to her GI MD and gets a Mammogram on 01/31/2018  Returns to her oncologist on Thursday 02/01/2018  Advance directives: Denies need for assist with advance directives     Consent: [facility] RN CM reviewed [facility] services with patient. Patient gave verbal consent for services. Advised  patient that other post discharge calls may occur to assess how the patient is doing following the recent hospitalization. Patient voiced understanding and was appreciative of f/u call.  Plan: [facility] RN CM will close case at this time as patient has been assessed and no needs identified/needs resolved.   Pt encouraged to return a call to [place] CM prn  [facility] RN CM sent a successful outreach letter as discussed with [facility] brochure enclosed for review    Yvan Dority L. Lavina Hamman, RN, BSN, [place] Management Care Coordinator Direct Number [phone] Mobile number [phone]  Main [facility] number [id number] Fax number [phone]

## 2018-01-24 ENCOUNTER — Ambulatory Visit: Payer: PPO | Admitting: Internal Medicine

## 2018-01-24 ENCOUNTER — Encounter: Payer: Self-pay | Admitting: Internal Medicine

## 2018-01-24 VITALS — BP 100/70 | HR 68 | Ht 63.0 in | Wt 194.0 lb

## 2018-01-24 DIAGNOSIS — R112 Nausea with vomiting, unspecified: Secondary | ICD-10-CM

## 2018-01-24 DIAGNOSIS — K219 Gastro-esophageal reflux disease without esophagitis: Secondary | ICD-10-CM | POA: Diagnosis not present

## 2018-01-24 DIAGNOSIS — Z1231 Encounter for screening mammogram for malignant neoplasm of breast: Secondary | ICD-10-CM | POA: Diagnosis not present

## 2018-01-24 DIAGNOSIS — R109 Unspecified abdominal pain: Secondary | ICD-10-CM

## 2018-01-24 DIAGNOSIS — R197 Diarrhea, unspecified: Secondary | ICD-10-CM

## 2018-01-24 NOTE — Patient Instructions (Signed)
Please follow up as needed 

## 2018-01-24 NOTE — Progress Notes (Signed)
HISTORY OF PRESENT ILLNESS:  [name] is a 63 y.o. female with multiple significant medical problems including recurrent pulmonary embolism for which she is on Xarelto, arthritis, fibromyalgia, hypertension, migraines, prior stroke, and myelodysplasia diagnosed around April 2019 for which she has been on Revlimid.  She is sent today by her primary care provider Dr. Jannette Fogo regarding recent acute persistent GI issues.  The patient reports being in her usual state of health until Thanksgiving when she developed problems with nausea vomiting and diarrhea.  2-4 bowel movements per day.  There was associated abdominal discomfort.  This lasted for approximately 5 to 6 weeks.  In December her Revlimid was discontinued.  She has been feeling better over the past 2 weeks.  No significant abdominal pain.  Appetite has improved.  Bowel habits back to baseline.  Patient does have GERD for which she takes Nexium.  No symptoms on medication.  Of importance, she did undergo CT scan of the abdomen and pelvis with contrast to evaluate the symptoms on December 22, 2017.  There was no acute process in the abdomen or pelvis.  Incidental findings as noted.  Head CT in September was negative.  Review of outside laboratories from January 08, 2018 finds unremarkable comprehensive metabolic panel.  Normal liver tests and magnesium.  Also, she has a history of adenomatous colon polyps on colonoscopy 2009.  Diminutive.  Last colonoscopy June 2014 was negative for neoplasia  REVIEW OF SYSTEMS:  All non-GI ROS negative unless otherwise stated in the HPI except for arthritis  Past Medical History:  Diagnosis Date  . Anemia   . Anxiety   . Arthritis    "knees, elbows, back" (06/22/2015)  . Bell palsy   . Chronic lower back pain   . Colon polyps   . Depression   . Fibromyalgia   . GERD (gastroesophageal reflux disease)   . High cholesterol   . History of blood transfusion 01/2014   "related to anemia"  . History of kidney  stones   . Hypertension   . Migraine headache    "2-3 times/year" (06/22/2015)  . Morbid obesity ([place])   . Pulmonary embolism ([place]) 01/2008; 03/2014   "2; 1"  . Seasonal allergies   . Stroke ([place]) 03/2014   denies residual on 06/22/2015  . Tumor, thyroid 2010   x 3  . Wears glasses     Past Surgical History:  Procedure Laterality Date  . ANTERIOR LAT LUMBAR FUSION N/A 08/02/2016   Procedure: Lumbar two-three Anterolateral Lumbar interbody fusion with lateral plate;  Surgeon: Erline Levine, MD;  Location: [place];  Service: Neurosurgery;  Laterality: N/A;  . BACK SURGERY    . BIOPSY THYROID  2010   "benign"  . BLADDER SURGERY     "for IC"  . BREAST BIOPSY Right 1999   BENIGN  . COLONOSCOPY    . COLONOSCOPY W/ POLYPECTOMY    . DIAGNOSTIC LAPAROSCOPY    . ENDOMETRIAL ABLATION    . JOINT REPLACEMENT    . LAMINECTOMY AND MICRODISCECTOMY SPINE Right 12/2008; 01/29/2009   L2-3/notes 12/11/2008; redo/notes 01/29/2009  . 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 TOTAL KNEE ARTHROPLASTY Right 06/22/2015  . TOTAL KNEE ARTHROPLASTY Right 06/22/2015   Procedure: TOTAL KNEE ARTHROPLASTY;  Surgeon: Vickey Huger, MD;  Location: [place];  Service: Orthopedics;  Laterality: Right;  . TUBAL LIGATION Bilateral 06/2004    Social History Arizbeth L Valek  reports that she has never smoked. She has  never used smokeless tobacco. She reports current alcohol use. She reports that she does not use drugs.  family history includes Alzheimer's disease in an other family member; Cancer in her mother; Heart disease in an other family member; Hypertension in her father; Kidney disease in an other family member.  Allergies  Allergen Reactions  . Shellfish-Derived Products Swelling and Anaphylaxis  . Pineapple Swelling and Other (See Comments)    Stomach upset       PHYSICAL EXAMINATION: Vital signs: BP 100/70   Pulse 68   Ht 5\' 3"  (1.6 m)   Wt 194 lb (88 kg)   BMI 34.37 kg/m   Constitutional: generally  well-appearing, no acute distress Psychiatric: alert and oriented x3, cooperative Eyes: extraocular movements intact, anicteric, conjunctiva pink.   Mouth: oral pharynx moist, no lesions Neck: supple no lymphadenopathy Cardiovascular: heart regular rate and rhythm, no murmur Lungs: clear to auscultation bilaterally Abdomen: soft, nontender, nondistended, no obvious ascites, no peritoneal signs, normal bowel sounds, no organomegaly Rectal: Omitted Extremities: no clubbing, cyanosis, or lower extremity edema bilaterally Skin: no lesions on visible extremities Neuro: No focal deficits.   ASSESSMENT:  1.  Previous problems with nausea vomiting and abdominal pain as described.  Suspect infectious gastroenteritis versus drug reaction from Revlimid.  The drug does have significant GI side effect profile including diarrhea (39 to 49%), nausea (10 to 30%), vomiting (6 to 12%), abdominal pain (10 to 12%) and decreased appetite (14%) 2.  History of adenomatous colon polyps.  Surveillance up-to-date 3.  Multiple medical problems including history of pulmonary embolus on oral anticoagulation and myelodysplastic syndrome 4.  GERD.  Controlled with PPI  PLAN:  1.  Under the guidance of her oncologist, the patient could resume her Revlimid and observe for recurrent symptoms.  If no recurrent symptoms then the presumption of previous self-limited infectious GI process would be supported.  However, if symptoms were to return then drug reaction would be most likely. 2.  Continue PPI for GERD 3.  Surveillance colonoscopy around June 2014 4.  Resume care with PCP and other specialist.  GI follow-up as needed  A copy of this consultation note has been sent to Dr. Jannette Fogo

## 2018-01-25 DIAGNOSIS — D46C Myelodysplastic syndrome with isolated del(5q) chromosomal abnormality: Secondary | ICD-10-CM | POA: Diagnosis not present

## 2018-01-30 DIAGNOSIS — G4723 Circadian rhythm sleep disorder, irregular sleep wake type: Secondary | ICD-10-CM | POA: Diagnosis not present

## 2018-01-30 DIAGNOSIS — J9600 Acute respiratory failure, unspecified whether with hypoxia or hypercapnia: Secondary | ICD-10-CM | POA: Diagnosis not present

## 2018-02-28 DIAGNOSIS — E782 Mixed hyperlipidemia: Secondary | ICD-10-CM | POA: Diagnosis not present

## 2018-02-28 DIAGNOSIS — I10 Essential (primary) hypertension: Secondary | ICD-10-CM | POA: Diagnosis not present

## 2018-02-28 DIAGNOSIS — R000 Tachycardia, unspecified: Secondary | ICD-10-CM | POA: Diagnosis not present

## 2018-02-28 DIAGNOSIS — D46C Myelodysplastic syndrome with isolated del(5q) chromosomal abnormality: Secondary | ICD-10-CM | POA: Diagnosis not present

## 2018-02-28 DIAGNOSIS — K219 Gastro-esophageal reflux disease without esophagitis: Secondary | ICD-10-CM | POA: Diagnosis not present

## 2018-02-28 DIAGNOSIS — R55 Syncope and collapse: Secondary | ICD-10-CM | POA: Diagnosis not present

## 2018-03-01 DIAGNOSIS — R350 Frequency of micturition: Secondary | ICD-10-CM | POA: Diagnosis not present

## 2018-03-02 DIAGNOSIS — G4723 Circadian rhythm sleep disorder, irregular sleep wake type: Secondary | ICD-10-CM | POA: Diagnosis not present

## 2018-03-02 DIAGNOSIS — J9600 Acute respiratory failure, unspecified whether with hypoxia or hypercapnia: Secondary | ICD-10-CM | POA: Diagnosis not present

## 2018-03-07 ENCOUNTER — Other Ambulatory Visit (HOSPITAL_COMMUNITY)
Admission: RE | Admit: 2018-03-07 | Discharge: 2018-03-07 | Disposition: A | Discharge status: Home / Self Care | Payer: PPO | Source: Ambulatory Visit | Attending: Oncology | Admitting: Oncology

## 2018-03-07 DIAGNOSIS — D469 Myelodysplastic syndrome, unspecified: Secondary | ICD-10-CM | POA: Insufficient documentation

## 2018-03-07 DIAGNOSIS — D46C Myelodysplastic syndrome with isolated del(5q) chromosomal abnormality: Secondary | ICD-10-CM | POA: Diagnosis not present

## 2018-03-07 DIAGNOSIS — D72819 Decreased white blood cell count, unspecified: Secondary | ICD-10-CM | POA: Diagnosis not present

## 2018-03-14 DIAGNOSIS — E782 Mixed hyperlipidemia: Secondary | ICD-10-CM | POA: Diagnosis not present

## 2018-03-14 DIAGNOSIS — I10 Essential (primary) hypertension: Secondary | ICD-10-CM | POA: Diagnosis not present

## 2018-03-14 DIAGNOSIS — K219 Gastro-esophageal reflux disease without esophagitis: Secondary | ICD-10-CM | POA: Diagnosis not present

## 2018-03-14 DIAGNOSIS — D46C Myelodysplastic syndrome with isolated del(5q) chromosomal abnormality: Secondary | ICD-10-CM | POA: Diagnosis not present

## 2018-03-14 DIAGNOSIS — G43009 Migraine without aura, not intractable, without status migrainosus: Secondary | ICD-10-CM | POA: Diagnosis not present

## 2018-03-19 ENCOUNTER — Encounter (HOSPITAL_COMMUNITY): Payer: Self-pay | Admitting: Oncology

## 2018-03-19 DIAGNOSIS — J9600 Acute respiratory failure, unspecified whether with hypoxia or hypercapnia: Secondary | ICD-10-CM | POA: Diagnosis not present

## 2018-03-19 DIAGNOSIS — G4723 Circadian rhythm sleep disorder, irregular sleep wake type: Secondary | ICD-10-CM | POA: Diagnosis not present

## 2018-03-20 DIAGNOSIS — J069 Acute upper respiratory infection, unspecified: Secondary | ICD-10-CM | POA: Diagnosis not present

## 2018-03-20 DIAGNOSIS — Z96651 Presence of right artificial knee joint: Secondary | ICD-10-CM | POA: Diagnosis not present

## 2018-03-20 DIAGNOSIS — R509 Fever, unspecified: Secondary | ICD-10-CM | POA: Diagnosis not present

## 2018-03-20 DIAGNOSIS — R05 Cough: Secondary | ICD-10-CM | POA: Diagnosis not present

## 2018-03-20 DIAGNOSIS — Z471 Aftercare following joint replacement surgery: Secondary | ICD-10-CM | POA: Diagnosis not present

## 2018-03-20 DIAGNOSIS — M25561 Pain in right knee: Secondary | ICD-10-CM | POA: Diagnosis not present

## 2018-03-22 DIAGNOSIS — D46C Myelodysplastic syndrome with isolated del(5q) chromosomal abnormality: Secondary | ICD-10-CM | POA: Diagnosis not present

## 2018-03-28 DIAGNOSIS — K219 Gastro-esophageal reflux disease without esophagitis: Secondary | ICD-10-CM | POA: Diagnosis not present

## 2018-03-28 DIAGNOSIS — Z5181 Encounter for therapeutic drug level monitoring: Secondary | ICD-10-CM | POA: Diagnosis not present

## 2018-03-28 DIAGNOSIS — F3341 Major depressive disorder, recurrent, in partial remission: Secondary | ICD-10-CM | POA: Diagnosis not present

## 2018-04-06 DIAGNOSIS — B353 Tinea pedis: Secondary | ICD-10-CM | POA: Diagnosis not present

## 2018-04-06 DIAGNOSIS — C7989 Secondary malignant neoplasm of other specified sites: Secondary | ICD-10-CM | POA: Diagnosis not present

## 2018-04-06 DIAGNOSIS — R531 Weakness: Secondary | ICD-10-CM | POA: Diagnosis not present

## 2018-04-06 DIAGNOSIS — G7000 Myasthenia gravis without (acute) exacerbation: Secondary | ICD-10-CM | POA: Diagnosis not present

## 2018-04-06 DIAGNOSIS — D469 Myelodysplastic syndrome, unspecified: Secondary | ICD-10-CM | POA: Diagnosis not present

## 2018-04-06 DIAGNOSIS — C642 Malignant neoplasm of left kidney, except renal pelvis: Secondary | ICD-10-CM | POA: Diagnosis not present

## 2018-04-06 DIAGNOSIS — D46C Myelodysplastic syndrome with isolated del(5q) chromosomal abnormality: Secondary | ICD-10-CM | POA: Diagnosis not present

## 2018-04-06 DIAGNOSIS — E1165 Type 2 diabetes mellitus with hyperglycemia: Secondary | ICD-10-CM | POA: Diagnosis not present

## 2018-04-06 DIAGNOSIS — B351 Tinea unguium: Secondary | ICD-10-CM | POA: Diagnosis not present

## 2018-04-20 DIAGNOSIS — D46C Myelodysplastic syndrome with isolated del(5q) chromosomal abnormality: Secondary | ICD-10-CM | POA: Diagnosis not present

## 2018-04-25 DIAGNOSIS — J069 Acute upper respiratory infection, unspecified: Secondary | ICD-10-CM | POA: Diagnosis not present

## 2018-04-25 DIAGNOSIS — M170 Bilateral primary osteoarthritis of knee: Secondary | ICD-10-CM | POA: Diagnosis not present

## 2018-04-25 DIAGNOSIS — F3341 Major depressive disorder, recurrent, in partial remission: Secondary | ICD-10-CM | POA: Diagnosis not present

## 2018-04-25 DIAGNOSIS — N390 Urinary tract infection, site not specified: Secondary | ICD-10-CM | POA: Diagnosis not present

## 2018-05-01 DIAGNOSIS — G4723 Circadian rhythm sleep disorder, irregular sleep wake type: Secondary | ICD-10-CM | POA: Diagnosis not present

## 2018-05-01 DIAGNOSIS — J9600 Acute respiratory failure, unspecified whether with hypoxia or hypercapnia: Secondary | ICD-10-CM | POA: Diagnosis not present

## 2018-05-03 DIAGNOSIS — M25512 Pain in left shoulder: Secondary | ICD-10-CM | POA: Diagnosis not present

## 2018-05-03 DIAGNOSIS — M25511 Pain in right shoulder: Secondary | ICD-10-CM | POA: Diagnosis not present

## 2018-05-03 DIAGNOSIS — G8929 Other chronic pain: Secondary | ICD-10-CM | POA: Diagnosis not present

## 2018-05-25 DIAGNOSIS — D46C Myelodysplastic syndrome with isolated del(5q) chromosomal abnormality: Secondary | ICD-10-CM | POA: Diagnosis not present

## 2018-05-29 DIAGNOSIS — Z5181 Encounter for therapeutic drug level monitoring: Secondary | ICD-10-CM | POA: Diagnosis not present

## 2018-05-29 DIAGNOSIS — D46C Myelodysplastic syndrome with isolated del(5q) chromosomal abnormality: Secondary | ICD-10-CM | POA: Diagnosis not present

## 2018-05-29 DIAGNOSIS — K219 Gastro-esophageal reflux disease without esophagitis: Secondary | ICD-10-CM | POA: Diagnosis not present

## 2018-05-29 DIAGNOSIS — G43009 Migraine without aura, not intractable, without status migrainosus: Secondary | ICD-10-CM | POA: Diagnosis not present

## 2018-05-29 DIAGNOSIS — E782 Mixed hyperlipidemia: Secondary | ICD-10-CM | POA: Diagnosis not present

## 2018-05-29 DIAGNOSIS — I10 Essential (primary) hypertension: Secondary | ICD-10-CM | POA: Diagnosis not present

## 2018-05-30 DIAGNOSIS — M542 Cervicalgia: Secondary | ICD-10-CM | POA: Diagnosis not present

## 2018-05-30 DIAGNOSIS — M47816 Spondylosis without myelopathy or radiculopathy, lumbar region: Secondary | ICD-10-CM | POA: Diagnosis not present

## 2018-05-30 DIAGNOSIS — M546 Pain in thoracic spine: Secondary | ICD-10-CM | POA: Diagnosis not present

## 2018-05-30 DIAGNOSIS — M4120 Other idiopathic scoliosis, site unspecified: Secondary | ICD-10-CM | POA: Diagnosis not present

## 2018-05-30 DIAGNOSIS — M545 Low back pain: Secondary | ICD-10-CM | POA: Diagnosis not present

## 2018-05-31 DIAGNOSIS — J9600 Acute respiratory failure, unspecified whether with hypoxia or hypercapnia: Secondary | ICD-10-CM | POA: Diagnosis not present

## 2018-05-31 DIAGNOSIS — G4723 Circadian rhythm sleep disorder, irregular sleep wake type: Secondary | ICD-10-CM | POA: Diagnosis not present

## 2018-06-11 DIAGNOSIS — R51 Headache: Secondary | ICD-10-CM | POA: Diagnosis not present

## 2018-06-15 DIAGNOSIS — M5136 Other intervertebral disc degeneration, lumbar region: Secondary | ICD-10-CM | POA: Diagnosis not present

## 2018-06-15 DIAGNOSIS — M5134 Other intervertebral disc degeneration, thoracic region: Secondary | ICD-10-CM | POA: Diagnosis not present

## 2018-06-15 DIAGNOSIS — Z981 Arthrodesis status: Secondary | ICD-10-CM | POA: Diagnosis not present

## 2018-06-15 DIAGNOSIS — M47817 Spondylosis without myelopathy or radiculopathy, lumbosacral region: Secondary | ICD-10-CM | POA: Diagnosis not present

## 2018-06-15 DIAGNOSIS — M50323 Other cervical disc degeneration at C6-C7 level: Secondary | ICD-10-CM | POA: Diagnosis not present

## 2018-06-15 DIAGNOSIS — M5124 Other intervertebral disc displacement, thoracic region: Secondary | ICD-10-CM | POA: Diagnosis not present

## 2018-06-15 DIAGNOSIS — M5126 Other intervertebral disc displacement, lumbar region: Secondary | ICD-10-CM | POA: Diagnosis not present

## 2018-06-15 DIAGNOSIS — M5023 Other cervical disc displacement, cervicothoracic region: Secondary | ICD-10-CM | POA: Diagnosis not present

## 2018-06-15 DIAGNOSIS — M47814 Spondylosis without myelopathy or radiculopathy, thoracic region: Secondary | ICD-10-CM | POA: Diagnosis not present

## 2018-06-15 DIAGNOSIS — M47812 Spondylosis without myelopathy or radiculopathy, cervical region: Secondary | ICD-10-CM | POA: Diagnosis not present

## 2018-06-18 DIAGNOSIS — M47816 Spondylosis without myelopathy or radiculopathy, lumbar region: Secondary | ICD-10-CM | POA: Diagnosis not present

## 2018-06-18 DIAGNOSIS — M545 Low back pain: Secondary | ICD-10-CM | POA: Diagnosis not present

## 2018-06-18 DIAGNOSIS — M4120 Other idiopathic scoliosis, site unspecified: Secondary | ICD-10-CM | POA: Diagnosis not present

## 2018-06-18 DIAGNOSIS — M542 Cervicalgia: Secondary | ICD-10-CM | POA: Diagnosis not present

## 2018-06-21 ENCOUNTER — Telehealth: Payer: Self-pay

## 2018-06-25 NOTE — Telephone Encounter (Signed)
Phone screening complete 

## 2018-06-26 ENCOUNTER — Ambulatory Visit (INDEPENDENT_AMBULATORY_CARE_PROVIDER_SITE_OTHER): Payer: PPO | Admitting: Internal Medicine

## 2018-06-26 VITALS — Ht 63.0 in | Wt 190.0 lb

## 2018-06-26 DIAGNOSIS — M5416 Radiculopathy, lumbar region: Secondary | ICD-10-CM | POA: Diagnosis not present

## 2018-06-26 DIAGNOSIS — R197 Diarrhea, unspecified: Secondary | ICD-10-CM

## 2018-06-26 DIAGNOSIS — K219 Gastro-esophageal reflux disease without esophagitis: Secondary | ICD-10-CM

## 2018-06-26 MED ORDER — OMEPRAZOLE 40 MG PO CPDR: 40.0000 mg | DELAYED_RELEASE_CAPSULE | Freq: Every day | ORAL | 11 refills | Status: DC

## 2018-06-26 NOTE — Patient Instructions (Signed)
We have sent the following medications to your pharmacy for you to pick up at your convenience: Omeprazole 40mg  daily  Normal BMI (Body Mass Index- based on height and weight) is between 19 and 25. Your BMI today is Body mass index is 33.66 kg/m. Marland Kitchen Please consider follow up  regarding your BMI with your Primary Care Provider.  Thank you for entrusting me with your care and choosing [facility].  Dr Henrene Pastor

## 2018-06-28 ENCOUNTER — Encounter: Payer: Self-pay | Admitting: Internal Medicine

## 2018-06-28 NOTE — Progress Notes (Signed)
HISTORY OF PRESENT ILLNESS:  [name] is a 63 y.o. female with multiple significant medical problems including recurrent pulmonary embolism on Xarelto, arthritis, fibromyalgia, hypertension, migraines, prior stroke, and myelodysplasia diagnosed around April 2019.  She was evaluated in this office January 24, 2018 with nausea vomiting and abdominal pain.  Her symptoms were felt secondary to a self-limited acute infectious gastroenteritis or a drug reaction from Revlimid (which have been discontinued due to symptoms).  Patient schedules this telehealth medicine visit during the coronavirus pandemic at the request of her oncologist Dr. Lavera Guise regarding problems with persistent diarrhea.  Patient tells me that her problems with diarrhea, which is now 5-6 loose watery bowel movements per day with 1 or 2 incontinent episodes, began approximately 3 months ago after resuming Revlimid therapy.  She currently takes therapy 3 times per week for 3 weeks then is off for 1 week.  There was a question as to whether her diarrhea may be secondary to something other than her treatment.  She did have multiple stool studies including C. difficile which were reported as negative.  She has been prescribed tincture of opium which she takes twice daily.  This helps.  This decreased the frequency of bowel movements to 2 or 3 times per day.  No nocturnal symptoms.  There is abdominal cramping.  No bleeding.  No intervening constipation.  She did undergo complete colonoscopy in June 2014 for surveillance of a history of diminutive adenoma.  The examination was entirely normal.  Follow-up in 10 years recommended.  Patient also complains of breakthrough reflux symptoms despite over-the-counter PPI 20 mg daily (Nexium).  She denies dysphasia.  REVIEW OF SYSTEMS:  All non-GI ROS negative unless otherwise stated in the HPI except for arthritis, muscle aches, back pain, headaches  Past Medical History:  Diagnosis Date  .  Anemia   . Anxiety   . Arthritis    "knees, elbows, back" (06/22/2015)  . Bell palsy   . Chronic lower back pain   . Colon polyps   . Depression   . Fibromyalgia   . GERD (gastroesophageal reflux disease)   . High cholesterol   . History of blood transfusion 01/2014   "related to anemia"  . History of kidney stones   . Hypertension   . Migraine headache    "2-3 times/year" (06/22/2015)  . Morbid obesity ([place])   . Pulmonary embolism ([place]) 01/2008; 03/2014   "2; 1"  . Seasonal allergies   . Stroke ([place]) 03/2014   denies residual on 06/22/2015  . Tumor, thyroid 2010   x 3  . Wears glasses     Past Surgical History:  Procedure Laterality Date  . ANTERIOR LAT LUMBAR FUSION N/A 08/02/2016   Procedure: Lumbar two-three Anterolateral Lumbar interbody fusion with lateral plate;  Surgeon: Erline Levine, MD;  Location: [place];  Service: Neurosurgery;  Laterality: N/A;  . BACK SURGERY    . BIOPSY THYROID  2010   "benign"  . BLADDER SURGERY     "for IC"  . BREAST BIOPSY Right 1999   BENIGN  . COLONOSCOPY    . COLONOSCOPY W/ POLYPECTOMY    . DIAGNOSTIC LAPAROSCOPY    . ENDOMETRIAL ABLATION    . JOINT REPLACEMENT    . LAMINECTOMY AND MICRODISCECTOMY SPINE Right 12/2008; 01/29/2009   L2-3/notes 12/11/2008; redo/notes 01/29/2009  . 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 TOTAL KNEE ARTHROPLASTY Right 06/22/2015  . TOTAL KNEE ARTHROPLASTY Right 06/22/2015  Procedure: TOTAL KNEE ARTHROPLASTY;  Surgeon: Vickey Huger, MD;  Location: [place];  Service: Orthopedics;  Laterality: Right;  . TUBAL LIGATION Bilateral 06/2004    Social History [name]  reports that she has never smoked. She has never used smokeless tobacco. She reports current alcohol use. She reports that she does not use drugs.  family history includes Alzheimer's disease in an other family member; Cancer in her mother; Heart disease in an other family member; Hypertension in her father; Kidney disease in an other family  member.  Allergies  Allergen Reactions  . Shellfish-Derived Products Swelling and Anaphylaxis  . Pineapple Swelling and Other (See Comments)    Stomach upset       PHYSICAL EXAMINATION: No physical examination with telehealth medicine visit   ASSESSMENT:  1.  Recurrent diarrhea with resumption of Revlimid.  It seems highly likely that this is the cause of her GI symptoms.  Drug is known to cause diarrhea and 39 to 49% of patients.  Stool studies have been negative.  Previous problems on drug resolved with drug discontinuation. 2.  History of diminutive adenoma 2009.  Normal colonoscopy 2014 3.  GERD.  Breakthrough symptoms on current dose of PPI 4.  Multiple significant medical problems including recurrent pulmonary embolism on chronic oral anticoagulation and myelodysplastic syndrome  PLAN:  1.  I have encouraged the patient to discuss with her oncologist prospects of again suspending her Revlimid therapy to see if her diarrhea resolves.  If so the options will be resumption of therapy and aggressive management of the side effect as there are currently doing.  Alternatively, could consider alternative therapy.  Those decisions will be left to her oncologist exclusively. 2.  If patient were to discontinue drug for significant period of time yet continue to experience the same degree of issues with diarrhea, then I would pursue additional GI work-up.  This may include repeat colonoscopy with biopsies.  This would be high risk in this patient with multiple comorbidities and chronic anticoagulation needs.  I discussed this with her.  She agrees. 3.  Reflux precautions with attention to weight loss 4.  PRESCRIBE omeprazole 40 mg daily; #30; 11 refills This telehealth medicine visit was initiated by the patient and consented for by the patient.  She was in her home and I was in my office during the encounter.  She understands her may be an associated professional charge for the  service.  Copy of this dictation has been sent to Dr. Bobby Rumpf at the [facility].  Fax: [phone]

## 2018-06-29 DIAGNOSIS — I10 Essential (primary) hypertension: Secondary | ICD-10-CM | POA: Diagnosis not present

## 2018-06-29 DIAGNOSIS — K219 Gastro-esophageal reflux disease without esophagitis: Secondary | ICD-10-CM | POA: Diagnosis not present

## 2018-06-29 DIAGNOSIS — Z5181 Encounter for therapeutic drug level monitoring: Secondary | ICD-10-CM | POA: Diagnosis not present

## 2018-06-29 DIAGNOSIS — G43009 Migraine without aura, not intractable, without status migrainosus: Secondary | ICD-10-CM | POA: Diagnosis not present

## 2018-06-29 DIAGNOSIS — E782 Mixed hyperlipidemia: Secondary | ICD-10-CM | POA: Diagnosis not present

## 2018-06-29 NOTE — Progress Notes (Signed)
Office notes have been faxed to Dr Lavera Guise.

## 2018-07-01 DIAGNOSIS — G4723 Circadian rhythm sleep disorder, irregular sleep wake type: Secondary | ICD-10-CM | POA: Diagnosis not present

## 2018-07-01 DIAGNOSIS — J9600 Acute respiratory failure, unspecified whether with hypoxia or hypercapnia: Secondary | ICD-10-CM | POA: Diagnosis not present

## 2018-07-03 ENCOUNTER — Telehealth: Payer: Self-pay | Admitting: Internal Medicine

## 2018-07-03 NOTE — Telephone Encounter (Signed)
Increase to twice daily 

## 2018-07-03 NOTE — Telephone Encounter (Signed)
Please advise 

## 2018-07-03 NOTE — Telephone Encounter (Signed)
Spoke with patient and told her to try the Pantoprazole twice a day to see if things improved.  She agreed.  She is still having some diarrhea but has only been off chemo for 2 weeks and wants to wait a little longer before letting Dr. Henrene Pastor know she wanted to try something else.

## 2018-07-03 NOTE — Telephone Encounter (Signed)
Patient called said that the medication omeprazole (PRILOSEC) 40 MG  Is not working at all.

## 2018-07-11 ENCOUNTER — Telehealth: Payer: Self-pay | Admitting: Internal Medicine

## 2018-07-11 MED ORDER — OMEPRAZOLE 40 MG PO CPDR: 40.0000 mg | DELAYED_RELEASE_CAPSULE | Freq: Two times a day (BID) | ORAL | 3 refills | Status: DC

## 2018-07-11 NOTE — Telephone Encounter (Signed)
Patient stated that she was advised to call when she needed a refill because the prescription will need to change due to change in dosing .

## 2018-07-11 NOTE — Telephone Encounter (Signed)
Sent new rx for omeprazole to be taken twice a day

## 2018-07-12 NOTE — Telephone Encounter (Signed)
Patient called said that she does not think omeprazole does not work very well she feels like she burps a lot while going to sleep.

## 2018-07-13 NOTE — Telephone Encounter (Signed)
Spoke with patient and advised her to try taking OTC Prilosec (two capsules at a time) until she can pick up her prescription.  Patient agreed.

## 2018-07-13 NOTE — Telephone Encounter (Signed)
Patient called said that she needs something to take until the 105.

## 2018-07-13 NOTE — Telephone Encounter (Signed)
Pt stated that insurance will not refill Prilosec until July 16th and reported that she is having GERD symptoms.

## 2018-07-14 DIAGNOSIS — Z86711 Personal history of pulmonary embolism: Secondary | ICD-10-CM | POA: Diagnosis not present

## 2018-07-14 DIAGNOSIS — R319 Hematuria, unspecified: Secondary | ICD-10-CM | POA: Diagnosis not present

## 2018-07-14 DIAGNOSIS — Z7901 Long term (current) use of anticoagulants: Secondary | ICD-10-CM | POA: Diagnosis not present

## 2018-07-14 DIAGNOSIS — M797 Fibromyalgia: Secondary | ICD-10-CM | POA: Diagnosis not present

## 2018-07-14 DIAGNOSIS — R109 Unspecified abdominal pain: Secondary | ICD-10-CM | POA: Diagnosis not present

## 2018-07-14 DIAGNOSIS — G8929 Other chronic pain: Secondary | ICD-10-CM | POA: Diagnosis not present

## 2018-07-14 DIAGNOSIS — E7800 Pure hypercholesterolemia, unspecified: Secondary | ICD-10-CM | POA: Diagnosis not present

## 2018-07-14 DIAGNOSIS — D1809 Hemangioma of other sites: Secondary | ICD-10-CM | POA: Diagnosis not present

## 2018-07-14 DIAGNOSIS — N200 Calculus of kidney: Secondary | ICD-10-CM | POA: Diagnosis not present

## 2018-07-14 DIAGNOSIS — M549 Dorsalgia, unspecified: Secondary | ICD-10-CM | POA: Diagnosis not present

## 2018-07-14 DIAGNOSIS — Z79899 Other long term (current) drug therapy: Secondary | ICD-10-CM | POA: Diagnosis not present

## 2018-07-14 DIAGNOSIS — I10 Essential (primary) hypertension: Secondary | ICD-10-CM | POA: Diagnosis not present

## 2018-07-14 DIAGNOSIS — Z8673 Personal history of transient ischemic attack (TIA), and cerebral infarction without residual deficits: Secondary | ICD-10-CM | POA: Diagnosis not present

## 2018-07-27 DIAGNOSIS — D46C Myelodysplastic syndrome with isolated del(5q) chromosomal abnormality: Secondary | ICD-10-CM | POA: Diagnosis not present

## 2018-07-27 DIAGNOSIS — D649 Anemia, unspecified: Secondary | ICD-10-CM | POA: Diagnosis not present

## 2018-07-27 DIAGNOSIS — D6859 Other primary thrombophilia: Secondary | ICD-10-CM | POA: Diagnosis not present

## 2018-07-30 DIAGNOSIS — E119 Type 2 diabetes mellitus without complications: Secondary | ICD-10-CM | POA: Diagnosis not present

## 2018-07-30 DIAGNOSIS — E559 Vitamin D deficiency, unspecified: Secondary | ICD-10-CM | POA: Diagnosis not present

## 2018-07-30 DIAGNOSIS — G43009 Migraine without aura, not intractable, without status migrainosus: Secondary | ICD-10-CM | POA: Diagnosis not present

## 2018-07-30 DIAGNOSIS — D518 Other vitamin B12 deficiency anemias: Secondary | ICD-10-CM | POA: Diagnosis not present

## 2018-07-30 DIAGNOSIS — E038 Other specified hypothyroidism: Secondary | ICD-10-CM | POA: Diagnosis not present

## 2018-07-30 DIAGNOSIS — K219 Gastro-esophageal reflux disease without esophagitis: Secondary | ICD-10-CM | POA: Diagnosis not present

## 2018-07-30 DIAGNOSIS — Z5181 Encounter for therapeutic drug level monitoring: Secondary | ICD-10-CM | POA: Diagnosis not present

## 2018-07-30 DIAGNOSIS — I10 Essential (primary) hypertension: Secondary | ICD-10-CM | POA: Diagnosis not present

## 2018-07-30 DIAGNOSIS — E782 Mixed hyperlipidemia: Secondary | ICD-10-CM | POA: Diagnosis not present

## 2018-07-30 IMAGING — RF DG C-ARM 61-120 MIN
1 series · 2 of 2 positions shown · non-contrast
Comparison: None.

CLINICAL DATA: L2-3 fusion

EXAM:
LUMBAR SPINE - 2-3 VIEW; DG C-ARM 61-120 MIN

[Series 1: run · 2 of 2 slices shown]
[im 1/2]
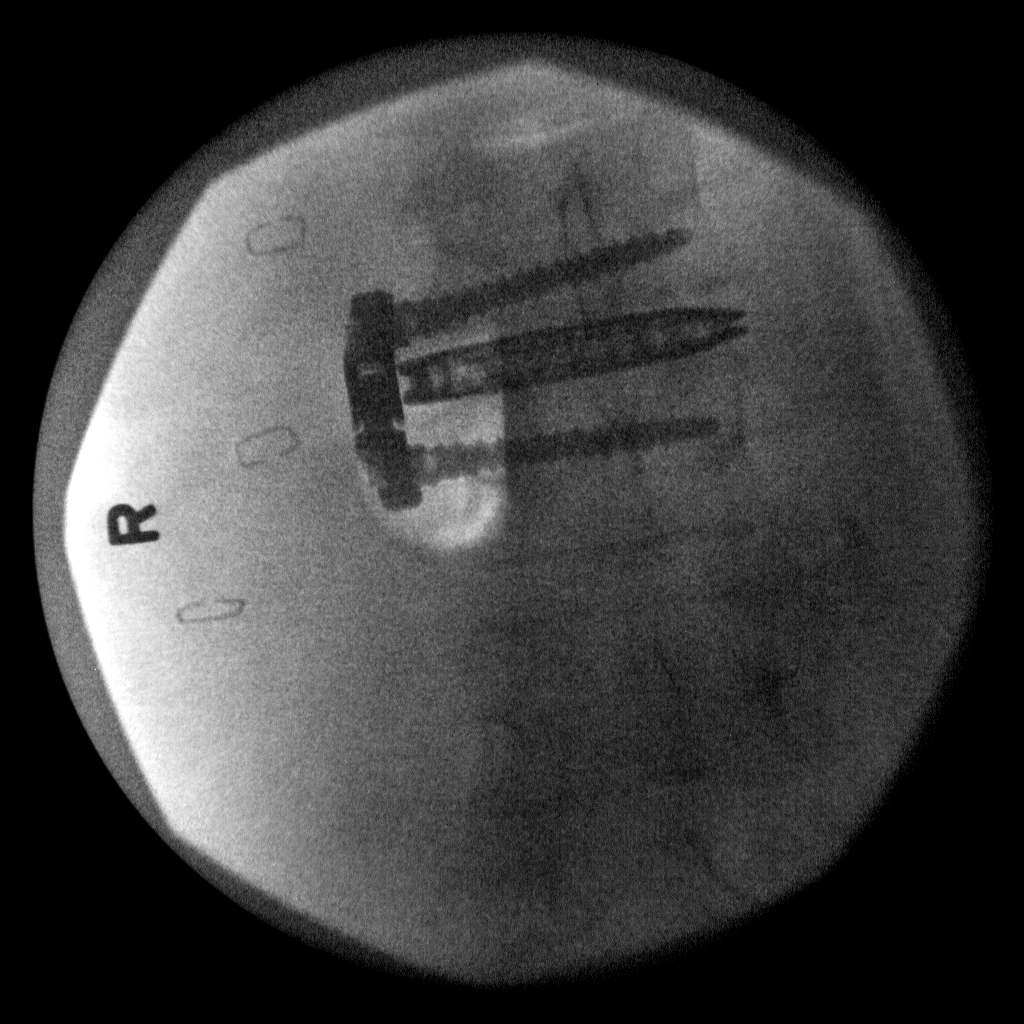
[im 2/2]
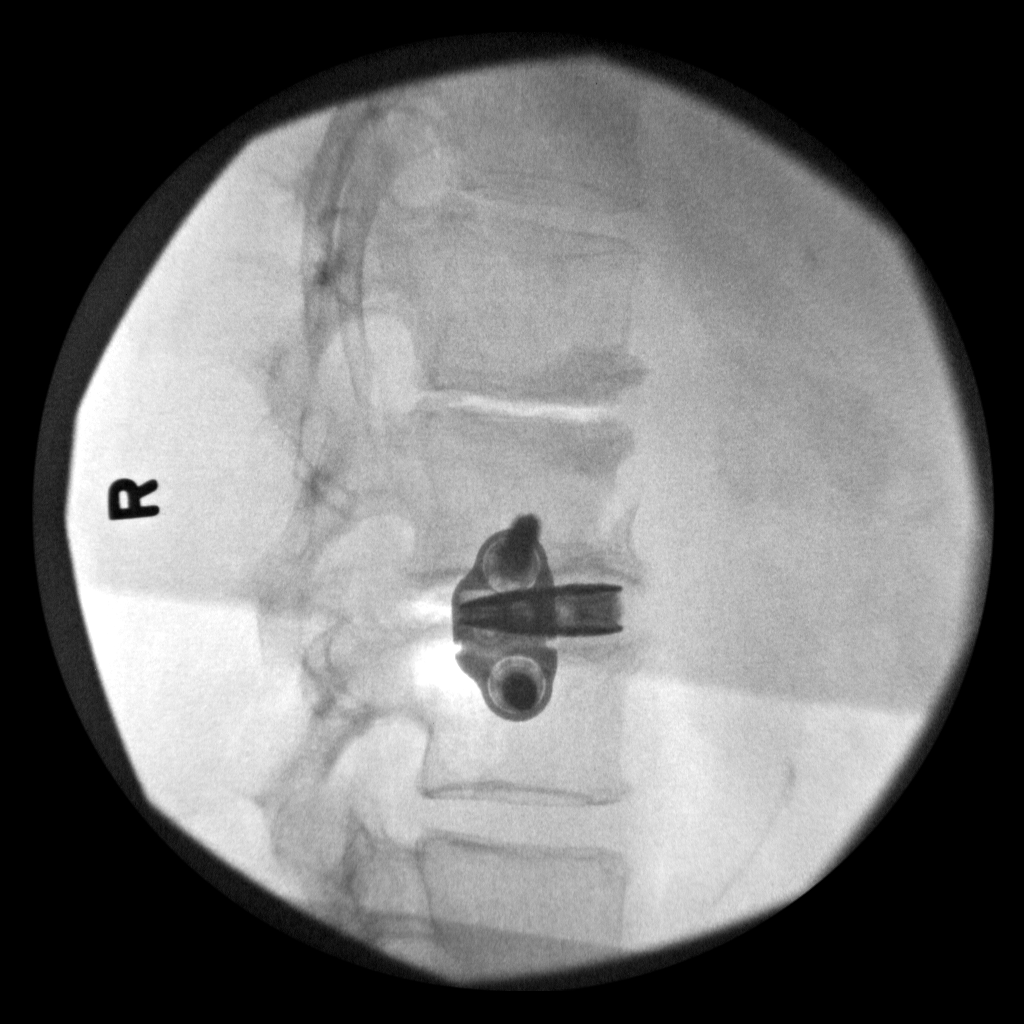

[2 of 2 positions shown; findings below may reference images not displayed]

FINDINGS: Changes of lateral fusion at L2-3. Normal alignment. No hardware
complicating feature.
IMPRESSION: Intraoperative imaging as above.

## 2018-07-31 DIAGNOSIS — J9600 Acute respiratory failure, unspecified whether with hypoxia or hypercapnia: Secondary | ICD-10-CM | POA: Diagnosis not present

## 2018-07-31 DIAGNOSIS — D46C Myelodysplastic syndrome with isolated del(5q) chromosomal abnormality: Secondary | ICD-10-CM | POA: Diagnosis not present

## 2018-07-31 DIAGNOSIS — G4723 Circadian rhythm sleep disorder, irregular sleep wake type: Secondary | ICD-10-CM | POA: Diagnosis not present

## 2018-07-31 IMAGING — DX DG CHEST 1V PORT
1 series · 1 of 1 positions shown · non-contrast
Comparison: 07/14/2014; chest CT - 07/14/2014

CLINICAL DATA: Shortness of breath

EXAM:
PORTABLE CHEST 1 VIEW

[chest ap]
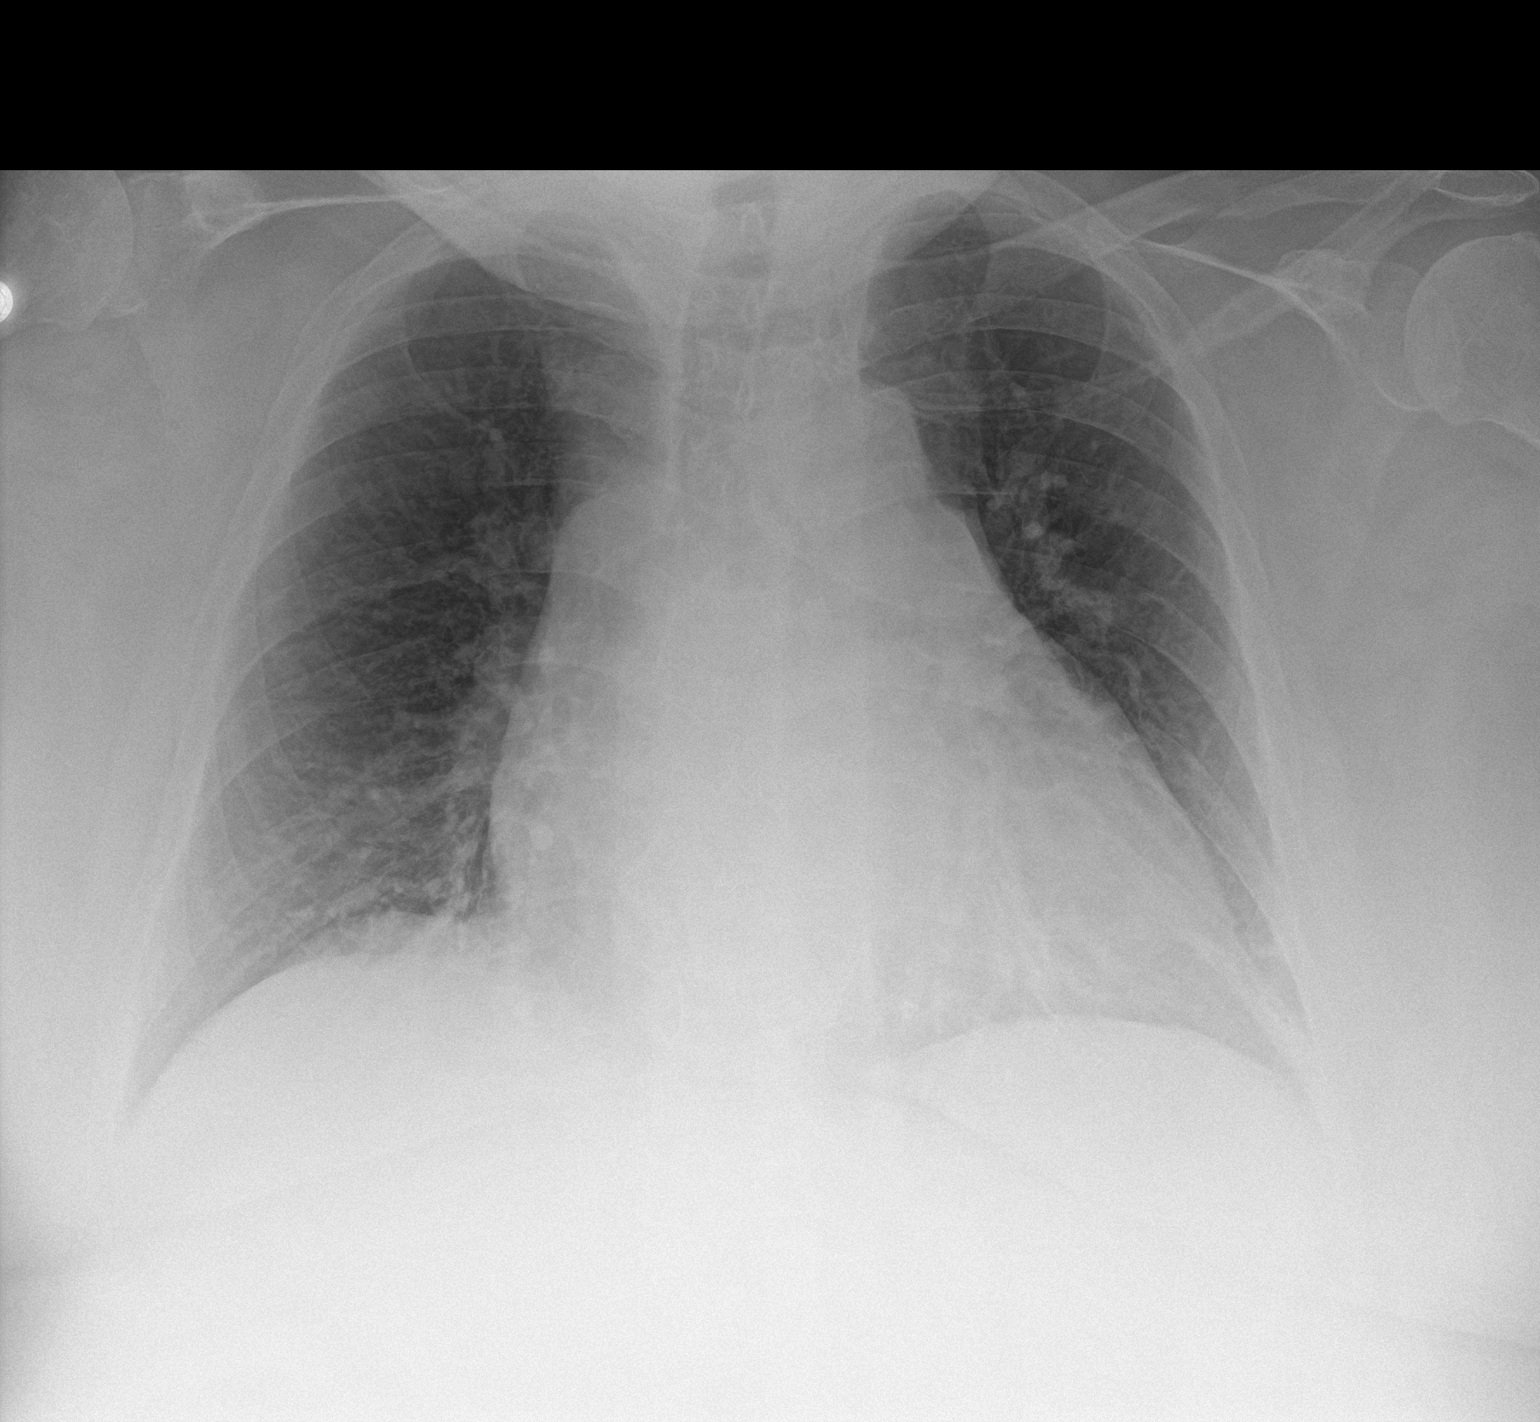

[1 of 1 positions shown; findings below may reference images not displayed]

FINDINGS: Grossly unchanged enlarged cardiac silhouette and mediastinal
contours. No focal airspace opacities. No pleural effusion or
pneumothorax. There is persistent mild elevation / eventration
involving the medial aspect of the right hemidiaphragm. No evidence
of edema. No acute osseus abnormalities.
IMPRESSION: Similar findings of cardiomegaly without superimposed acute
cardiopulmonary disease.

## 2018-08-06 ENCOUNTER — Telehealth: Payer: Self-pay | Admitting: Internal Medicine

## 2018-08-06 ENCOUNTER — Other Ambulatory Visit: Payer: Self-pay

## 2018-08-06 MED ORDER — COLESTIPOL HCL 1 G PO TABS: 2.0000 g | ORAL_TABLET | Freq: Two times a day (BID) | ORAL | 2 refills | Status: DC

## 2018-08-06 MED ORDER — COLESTIPOL HCL 1 G PO TABS: 2.0000 g | ORAL_TABLET | Freq: Two times a day (BID) | ORAL | 3 refills | Status: DC

## 2018-08-06 NOTE — Telephone Encounter (Signed)
Pt states she has been off her Revlimid for 6 weeks and she is still having 4-5 diarrhea stools/day. There is no blood in the stool. Dr. Bobby Rumpf instucted her to hold the Revlimid for 4 more weeks but he does not think this is causing her multiple diarrhea stools. Please advise.

## 2018-08-06 NOTE — Telephone Encounter (Signed)
Pt states her oncologist had given her tincture of opium but it is not available now so he had already given her the script for lomotil with the same instructions. She is taking the lomotil and still having the mutliple loose/diarrhea stools. Please advise.

## 2018-08-06 NOTE — Telephone Encounter (Signed)
Prescribe Lomotil.  She may take 1-2 every 6 hours as needed.  Dispense 60.  1 refill

## 2018-08-06 NOTE — Telephone Encounter (Signed)
1.  May take up to 6 Lomotil per day 2.  Prescribe Colestid 2 g twice daily.  Do not take within 2 hours of other medications 3.  Give [place] follow-up report in 4 weeks

## 2018-08-06 NOTE — Telephone Encounter (Signed)
Spoke with pt and she is aware. Script sent to pharmacy. 

## 2018-08-08 DIAGNOSIS — M4120 Other idiopathic scoliosis, site unspecified: Secondary | ICD-10-CM | POA: Diagnosis not present

## 2018-08-08 DIAGNOSIS — D46C Myelodysplastic syndrome with isolated del(5q) chromosomal abnormality: Secondary | ICD-10-CM | POA: Diagnosis not present

## 2018-08-08 DIAGNOSIS — M546 Pain in thoracic spine: Secondary | ICD-10-CM | POA: Diagnosis not present

## 2018-08-08 DIAGNOSIS — M545 Low back pain: Secondary | ICD-10-CM | POA: Diagnosis not present

## 2018-08-08 DIAGNOSIS — D469 Myelodysplastic syndrome, unspecified: Secondary | ICD-10-CM | POA: Diagnosis not present

## 2018-08-14 DIAGNOSIS — M25512 Pain in left shoulder: Secondary | ICD-10-CM | POA: Diagnosis not present

## 2018-08-14 DIAGNOSIS — G8929 Other chronic pain: Secondary | ICD-10-CM | POA: Diagnosis not present

## 2018-08-16 DIAGNOSIS — D46C Myelodysplastic syndrome with isolated del(5q) chromosomal abnormality: Secondary | ICD-10-CM | POA: Diagnosis not present

## 2018-08-28 DIAGNOSIS — D46C Myelodysplastic syndrome with isolated del(5q) chromosomal abnormality: Secondary | ICD-10-CM | POA: Diagnosis not present

## 2018-08-28 DIAGNOSIS — D649 Anemia, unspecified: Secondary | ICD-10-CM | POA: Diagnosis not present

## 2018-08-28 DIAGNOSIS — D6859 Other primary thrombophilia: Secondary | ICD-10-CM | POA: Diagnosis not present

## 2018-08-29 DIAGNOSIS — Z79891 Long term (current) use of opiate analgesic: Secondary | ICD-10-CM | POA: Diagnosis not present

## 2018-08-29 DIAGNOSIS — R030 Elevated blood-pressure reading, without diagnosis of hypertension: Secondary | ICD-10-CM | POA: Diagnosis not present

## 2018-08-29 DIAGNOSIS — Z5181 Encounter for therapeutic drug level monitoring: Secondary | ICD-10-CM | POA: Diagnosis not present

## 2018-08-29 DIAGNOSIS — D469 Myelodysplastic syndrome, unspecified: Secondary | ICD-10-CM | POA: Diagnosis not present

## 2018-08-29 DIAGNOSIS — Z6835 Body mass index (BMI) 35.0-35.9, adult: Secondary | ICD-10-CM | POA: Diagnosis not present

## 2018-08-29 DIAGNOSIS — Z79899 Other long term (current) drug therapy: Secondary | ICD-10-CM | POA: Diagnosis not present

## 2018-08-29 DIAGNOSIS — M47816 Spondylosis without myelopathy or radiculopathy, lumbar region: Secondary | ICD-10-CM | POA: Diagnosis not present

## 2018-08-30 DIAGNOSIS — Z5181 Encounter for therapeutic drug level monitoring: Secondary | ICD-10-CM | POA: Diagnosis not present

## 2018-08-30 DIAGNOSIS — K219 Gastro-esophageal reflux disease without esophagitis: Secondary | ICD-10-CM | POA: Diagnosis not present

## 2018-08-30 DIAGNOSIS — E782 Mixed hyperlipidemia: Secondary | ICD-10-CM | POA: Diagnosis not present

## 2018-08-30 DIAGNOSIS — I10 Essential (primary) hypertension: Secondary | ICD-10-CM | POA: Diagnosis not present

## 2018-08-30 DIAGNOSIS — G43009 Migraine without aura, not intractable, without status migrainosus: Secondary | ICD-10-CM | POA: Diagnosis not present

## 2018-09-20 ENCOUNTER — Telehealth: Payer: Self-pay | Admitting: Internal Medicine

## 2018-09-20 NOTE — Telephone Encounter (Signed)
Pt reported that she has diarrhea.  Please advise.

## 2018-09-20 NOTE — Telephone Encounter (Signed)
Pt states the colestid is not helping. States she is taking 4 lomotil a day and still has diarrhea. Discussed with her she can take up to 6 daily, pt states she doesn't like to take lomotil everyday and if she takes a lot she gets constipated. Pt states she has not been getting tx from cancer center in 3 1/2 months but the diarrhea continues and it is "getting old." Pt scheduled to see Dr. Henrene Pastor 10/09/18@2pm . Pt aware of appt.

## 2018-09-25 DIAGNOSIS — M4802 Spinal stenosis, cervical region: Secondary | ICD-10-CM | POA: Diagnosis not present

## 2018-09-27 DIAGNOSIS — F3341 Major depressive disorder, recurrent, in partial remission: Secondary | ICD-10-CM | POA: Diagnosis not present

## 2018-09-27 DIAGNOSIS — E559 Vitamin D deficiency, unspecified: Secondary | ICD-10-CM | POA: Diagnosis not present

## 2018-09-27 DIAGNOSIS — Z23 Encounter for immunization: Secondary | ICD-10-CM | POA: Diagnosis not present

## 2018-09-27 DIAGNOSIS — Z5181 Encounter for therapeutic drug level monitoring: Secondary | ICD-10-CM | POA: Diagnosis not present

## 2018-09-27 DIAGNOSIS — M170 Bilateral primary osteoarthritis of knee: Secondary | ICD-10-CM | POA: Diagnosis not present

## 2018-10-02 DIAGNOSIS — D46C Myelodysplastic syndrome with isolated del(5q) chromosomal abnormality: Secondary | ICD-10-CM | POA: Diagnosis not present

## 2018-10-09 ENCOUNTER — Ambulatory Visit: Payer: PPO | Admitting: Internal Medicine

## 2018-10-29 DIAGNOSIS — D46C Myelodysplastic syndrome with isolated del(5q) chromosomal abnormality: Secondary | ICD-10-CM | POA: Diagnosis not present

## 2018-10-29 DIAGNOSIS — Z23 Encounter for immunization: Secondary | ICD-10-CM | POA: Diagnosis not present

## 2018-10-29 DIAGNOSIS — Z5181 Encounter for therapeutic drug level monitoring: Secondary | ICD-10-CM | POA: Diagnosis not present

## 2018-10-29 DIAGNOSIS — Z86711 Personal history of pulmonary embolism: Secondary | ICD-10-CM | POA: Diagnosis not present

## 2018-10-29 DIAGNOSIS — U071 COVID-19: Secondary | ICD-10-CM | POA: Diagnosis not present

## 2018-11-05 DIAGNOSIS — M4802 Spinal stenosis, cervical region: Secondary | ICD-10-CM | POA: Diagnosis not present

## 2018-11-05 DIAGNOSIS — M47816 Spondylosis without myelopathy or radiculopathy, lumbar region: Secondary | ICD-10-CM | POA: Diagnosis not present

## 2018-11-06 DIAGNOSIS — E559 Vitamin D deficiency, unspecified: Secondary | ICD-10-CM | POA: Diagnosis not present

## 2018-11-06 DIAGNOSIS — E782 Mixed hyperlipidemia: Secondary | ICD-10-CM | POA: Diagnosis not present

## 2018-11-06 DIAGNOSIS — E038 Other specified hypothyroidism: Secondary | ICD-10-CM | POA: Diagnosis not present

## 2018-11-06 DIAGNOSIS — R011 Cardiac murmur, unspecified: Secondary | ICD-10-CM | POA: Diagnosis not present

## 2018-11-06 DIAGNOSIS — Z5181 Encounter for therapeutic drug level monitoring: Secondary | ICD-10-CM | POA: Diagnosis not present

## 2018-11-06 DIAGNOSIS — E119 Type 2 diabetes mellitus without complications: Secondary | ICD-10-CM | POA: Diagnosis not present

## 2018-11-06 DIAGNOSIS — I10 Essential (primary) hypertension: Secondary | ICD-10-CM | POA: Diagnosis not present

## 2018-11-06 DIAGNOSIS — R05 Cough: Secondary | ICD-10-CM | POA: Diagnosis not present

## 2018-11-06 DIAGNOSIS — D518 Other vitamin B12 deficiency anemias: Secondary | ICD-10-CM | POA: Diagnosis not present

## 2018-11-08 ENCOUNTER — Ambulatory Visit: Payer: PPO | Admitting: Internal Medicine

## 2018-11-22 ENCOUNTER — Encounter: Payer: Self-pay | Admitting: Cardiology

## 2018-11-22 ENCOUNTER — Other Ambulatory Visit: Payer: Self-pay

## 2018-11-22 ENCOUNTER — Encounter: Payer: Self-pay | Admitting: *Deleted

## 2018-11-22 ENCOUNTER — Ambulatory Visit (INDEPENDENT_AMBULATORY_CARE_PROVIDER_SITE_OTHER): Payer: PPO | Admitting: Cardiology

## 2018-11-22 VITALS — BP 130/90 | HR 110 | Ht 63.0 in | Wt 204.0 lb

## 2018-11-22 DIAGNOSIS — E782 Mixed hyperlipidemia: Secondary | ICD-10-CM | POA: Diagnosis not present

## 2018-11-22 DIAGNOSIS — Z131 Encounter for screening for diabetes mellitus: Secondary | ICD-10-CM

## 2018-11-22 DIAGNOSIS — I10 Essential (primary) hypertension: Secondary | ICD-10-CM

## 2018-11-22 DIAGNOSIS — E669 Obesity, unspecified: Secondary | ICD-10-CM | POA: Diagnosis not present

## 2018-11-22 DIAGNOSIS — R079 Chest pain, unspecified: Secondary | ICD-10-CM | POA: Diagnosis not present

## 2018-11-22 MED ORDER — ATENOLOL 25 MG PO TABS: 25.0000 mg | ORAL_TABLET | Freq: Every day | ORAL | 1 refills | Status: DC

## 2018-11-22 NOTE — Progress Notes (Signed)
Cardiology Office Note:    Date:  11/22/2018   ID:  [name], DOB 10/26/1955, MRN [id number]  PCP:  Bonnita Nasuti, MD  Cardiologist:  Berniece Salines, DO  Electrophysiologist:  None   Referring MD: Laverle Hobby, NP   Chief Complaint  Patient presents with  . Chest Pain    History of Present Illness:    [name] is a 63 y.o. 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  Patient tells me that she has been experiencing intermittent midsternal chest pain.  She notes that this is a pressure-like feeling last for a few minutes prior to resolution.  She admits to sometimes having shortness of breath with this.  Admits that this is different from the pain she usually have when she has recurrent pulmonary embolism.  She denies any lightheadedness dizziness.   Past Medical History:  Diagnosis Date  . Anemia   . Anxiety   . Arthritis    "knees, elbows, back" (06/22/2015)  . Bell palsy   . Chronic lower back pain   . Colon polyps   . Depression   . Fibromyalgia   . GERD (gastroesophageal reflux disease)   . High cholesterol   . History of blood transfusion 01/2014   "related to anemia"  . History of kidney stones   . Hypertension   . Migraine headache    "2-3 times/year" (06/22/2015)  . Morbid obesity ([place])   . Pulmonary embolism ([place]) 01/2008; 03/2014   "2; 1"  . Seasonal allergies   . Stroke ([place]) 03/2014   denies residual on 06/22/2015  . Tumor, thyroid 2010   x 3  . Wears glasses     Past Surgical History:  Procedure Laterality Date  . ANTERIOR LAT LUMBAR FUSION N/A 08/02/2016   Procedure: Lumbar two-three Anterolateral Lumbar interbody fusion with lateral plate;  Surgeon: Erline Levine, MD;  Location: [facility];  Service: Neurosurgery;  Laterality: N/A;  . BACK SURGERY    . BIOPSY THYROID  2010   "benign"  . BLADDER SURGERY     "for IC"  .  BREAST BIOPSY Right 1999   BENIGN  . COLONOSCOPY    . COLONOSCOPY W/ POLYPECTOMY    . DIAGNOSTIC LAPAROSCOPY    . ENDOMETRIAL ABLATION    . JOINT REPLACEMENT    . LAMINECTOMY AND MICRODISCECTOMY SPINE Right 12/2008; 01/29/2009   L2-3/notes 12/11/2008; redo/notes 01/29/2009  . 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 TOTAL KNEE ARTHROPLASTY Right 06/22/2015  . TOTAL KNEE ARTHROPLASTY Right 06/22/2015   Procedure: TOTAL KNEE ARTHROPLASTY;  Surgeon: Vickey Huger, MD;  Location: [facility];  Service: Orthopedics;  Laterality: Right;  . TUBAL LIGATION Bilateral 06/2004    Current Medications: Current Meds  Medication Sig  . allopurinol (ZYLOPRIM) 100 MG tablet Take 200 mg by mouth daily.   . ARIPiprazole (ABILIFY) 5 MG tablet Take by mouth.  Marland Kitchen buPROPion (WELLBUTRIN XL) 300 MG 24 hr tablet Take 300 mg by mouth every morning.  . diphenoxylate-atropine (LOMOTIL) 2.5-0.025 MG tablet 1 - 2 TABS PO QID As Needed for Diarrhea; Take 1-2 tablets after each diarrhea stool for max of 8 tablets daily.  Marland Kitchen esomeprazole (NEXIUM 24HR) 20 MG capsule Take 20 mg by mouth daily before breakfast.  . gabapentin (NEURONTIN) 600 MG tablet Take 600 mg by mouth 3 (three) times daily.  Marland Kitchen HYDROcodone-acetaminophen (NORCO) 10-325  MG tablet Take 1 tablet by mouth 4 (four) times daily as needed.  Marland Kitchen omeprazole (PRILOSEC) 40 MG capsule Take 1 capsule (40 mg total) by mouth 2 (two) times a day.  . polyvinyl alcohol-povidone (REFRESH) 1.4-0.6 % ophthalmic solution Place 1-2 drops into both eyes as needed (for dry eyes).  . potassium chloride (MICRO-K) 10 MEQ CR capsule Take 10 mEq by mouth daily.  . sertraline (ZOLOFT) 100 MG tablet Take 150 mg by mouth every morning.  . simvastatin (ZOCOR) 20 MG tablet Take 20 mg by mouth every evening.   . SUMAtriptan (IMITREX) 25 MG tablet Take by mouth.  . topiramate (TOPAMAX) 50 MG tablet Take 50 mg by mouth 2 (two) times daily.  . Vitamin D, Ergocalciferol, (DRISDOL) 1.25 MG (50000 UT)  CAPS capsule TAKE 1 CAPSULE TWICE A WEEK  . XARELTO 20 MG TABS tablet Take 20 mg by mouth daily with supper.   . zolpidem (AMBIEN) 5 MG tablet Take 5 mg by mouth at bedtime as needed for sleep.  . [DISCONTINUED] OPIUM TINCTURE, PAREGORIC, PO Take 0.5 mLs by mouth every 3 (three) hours.     Allergies:   Shellfish-derived products and Pineapple   Social History   Socioeconomic History  . Marital status: Married    Spouse name: Not on file  . Number of children: 0  . Years of education: Not on file  . Highest education level: Not on file  Occupational History  . Occupation: disabled  Social Needs  . Financial resource strain: Not on file  . Food insecurity    Worry: Not on file    Inability: Not on file  . Transportation needs    Medical: Not on file    Non-medical: Not on file  Tobacco Use  . Smoking status: Never Smoker  . Smokeless tobacco: Never Used  Substance and Sexual Activity  . Alcohol use: Yes    Comment: 06/22/2015 "might have 1 drink q 3 months, if that"  . Drug use: No  . Sexual activity: Yes  Lifestyle  . Physical activity    Days per week: Not on file    Minutes per session: Not on file  . Stress: Not on file  Relationships  . Social Herbalist on phone: Not on file    Gets together: Not on file    Attends religious service: Not on file    Active member of club or organization: Not on file    Attends meetings of clubs or organizations: Not on file    Relationship status: Not on file  Other Topics Concern  . Not on file  Social History Narrative  . Not on file     Family History: The patient's family history includes Alzheimer's disease in an other family member; Cancer in her mother; Heart disease in an other family member; Hypertension in her father; Kidney disease in an other family member. There is no history of Colon cancer.  ROS:   Review of Systems  Constitution: Negative for decreased appetite, fever and weight gain.  HENT:  Negative for congestion, ear discharge, hoarse voice and sore throat.   Eyes: Negative for discharge, redness, vision loss in right eye and visual halos.  Cardiovascular: Reports chest pain, negative for dyspnea on exertion, leg swelling, orthopnea and palpitations.  Respiratory: Negative for cough, hemoptysis, shortness of breath and snoring.   Endocrine: Negative for heat intolerance and polyphagia.  Hematologic/Lymphatic: Negative for bleeding problem. Does not bruise/bleed easily.  Skin:  Negative for flushing, nail changes, rash and suspicious lesions.  Musculoskeletal: Negative for arthritis, joint pain, muscle cramps, myalgias, neck pain and stiffness.  Gastrointestinal: Negative for abdominal pain, bowel incontinence, diarrhea and excessive appetite.  Genitourinary: Negative for decreased libido, genital sores and incomplete emptying.  Neurological: Negative for brief paralysis, focal weakness, headaches and loss of balance.  Psychiatric/Behavioral: Negative for altered mental status, depression and suicidal ideas.  Allergic/Immunologic: Negative for HIV exposure and persistent infections.    EKGs/Labs/Other Studies Reviewed:    The following studies were reviewed today:   EKG:  The ekg ordered today demonstrates tachycardia, heart rate 102 bpm.  Recent Labs: No results found for requested labs within last 8760 hours.  Recent Lipid Panel    Component Value Date/Time   CHOL (H) 02/22/2008 0215    208        ATP III CLASSIFICATION:  <200     mg/dL   Desirable  200-239  mg/dL   Borderline High  >=240    mg/dL   High          TRIG 107 02/22/2008 0215   HDL 35 (L) 02/22/2008 0215   CHOLHDL 5.9 02/22/2008 0215   VLDL 21 02/22/2008 0215   LDLCALC (H) 02/22/2008 0215    152        Total Cholesterol/HDL:CHD Risk Coronary Heart Disease Risk Table                     Men   Women  1/2 Average Risk   3.4   3.3  Average Risk       5.0   4.4  2 X Average Risk   9.6   7.1  3 X  Average Risk  23.4   11.0        Use the calculated Patient Ratio above and the CHD Risk Table to determine the patient's CHD Risk.        ATP III CLASSIFICATION (LDL):  <100     mg/dL   Optimal  100-129  mg/dL   Near or Above                    Optimal  130-159  mg/dL   Borderline  160-189  mg/dL   High  >190     mg/dL   Very High    Physical Exam:    VS:  BP 130/90 (BP Location: Right Arm, Patient Position: Sitting, Cuff Size: Normal)   Pulse (!) 110   Ht 5\' 3"  (1.6 m)   Wt 204 lb (92.5 kg)   SpO2 97%   BMI 36.14 kg/m     Wt Readings from Last 3 Encounters:  11/22/18 204 lb (92.5 kg)  06/26/18 190 lb (86.2 kg)  01/24/18 194 lb (88 kg)     GEN: Well nourished, well developed in no acute distress HEENT: Normal NECK: No JVD; No carotid bruits LYMPHATICS: No lymphadenopathy CARDIAC: S1S2 noted,RRR, no murmurs, rubs, gallops RESPIRATORY:  Clear to auscultation without rales, wheezing or rhonchi  ABDOMEN: Soft, non-tender, non-distended, +bowel sounds, no guarding. EXTREMITIES: No edema, No cyanosis, no clubbing MUSCULOSKELETAL:  No edema; No deformity  SKIN: Warm and dry NEUROLOGIC:  Alert and oriented x 3, non-focal PSYCHIATRIC:  Normal affect, good insight  ASSESSMENT:    1. Diabetes mellitus screening   2. Chest pain, unspecified type   3. Essential hypertension   4. Mixed hyperlipidemia   5. Obesity (BMI 30-39.9)    PLAN:  1.  She does have evidence of diastolic hypertension in the office today with her diastolic pressure goal at 100 mmHg.  At this time I like to start the patient back on her atenolol at 25 mg daily.  She tells me that her PCP did an echocardiogram in his office will request this report.  2.  In terms of her chest pain given her risk factors I like to pursue an ischemic evaluation.  I have educated patient on the pharmacologic nuclear stress test and she is agreeable to perform this testing.  3.  Hyperlipidemia-continue patient her current  statin dose.  4.  Sinus tachycardia in the office today I do suspect underlying anxiety for her visit today is causing this problem.  Continue to monitor the patient.  5.  Obesity the patient understands the need to lose weight with diet and exercise. We have discussed specific strategies for this.  The patient is in agreement with the above plan. The patient left the office in stable condition.  The patient will follow up in 3 months.   Medication Adjustments/Labs and Tests Ordered: Current medicines are reviewed at length with the patient today.  Concerns regarding medicines are outlined above.  Orders Placed This Encounter  Procedures  . HgB A1c  . MYOCARDIAL PERFUSION IMAGING  . EKG 12-Lead   Meds ordered this encounter  Medications  . atenolol (TENORMIN) 25 MG tablet    Sig: Take 1 tablet (25 mg total) by mouth daily.    Dispense:  90 tablet    Refill:  1    Patient Instructions  Medication Instructions:  Your physician has recommended you make the following change in your medication:   START: Atenolol 25 mg Take 1 tab daily  *If you need a refill on your cardiac medications before your next appointment, please call your pharmacy*  Lab Work: Your physician recommends that you return for lab work in: [place]  If you have labs (blood work) drawn today and your tests are completely normal, you will receive your results only by: Marland Kitchen MyChart Message (if you have MyChart) OR . A paper copy in the mail If you have any lab test that is abnormal or we need to change your treatment, we will call you to review the results.  Testing/Procedures: Your physician has requested that you have a lexiscan myoview. For further information please visit [email or web address]. Please follow instruction sheet, as given.   Follow-Up: At [facility], you and your health needs are our priority.  As part of our continuing mission to provide you with exceptional heart care, we have created  designated Provider Care Teams.  These Care Teams include your primary Cardiologist (physician) and Advanced Practice Providers (APPs -  Physician Assistants and Nurse Practitioners) who all work together to provide you with the care you need, when you need it.  Your next appointment:   3 month(s)  The format for your next appointment:   In Person  Provider:   Berniece Salines, DO  Other Instructions  Cardiac Nuclear Scan A cardiac nuclear scan is a test that is done to check the flow of blood to your heart. It is done when you are resting and when you are exercising. The test looks for problems such as:  Not enough blood reaching a portion of the heart.  The heart muscle not working as it should. You may need this test if:  You have heart disease.  You have had lab results that are not  normal.  You have had heart surgery or a balloon procedure to open up blocked arteries (angioplasty).  You have chest pain.  You have shortness of breath. In this test, a special dye (tracer) is put into your bloodstream. The tracer will travel to your heart. A camera will then take pictures of your heart to see how the tracer moves through your heart. This test is usually done at a hospital and takes 2-4 hours. Tell a doctor about:  Any allergies you have.  All medicines you are taking, including vitamins, herbs, eye drops, creams, and over-the-counter medicines.  Any problems you or family members have had with anesthetic medicines.  Any blood disorders you have.  Any surgeries you have had.  Any medical conditions you have.  Whether you are pregnant or may be pregnant. What are the risks? Generally, this is a safe test. However, problems may occur, such as:  Serious chest pain and heart attack. This is only a risk if the stress portion of the test is done.  Rapid heartbeat.  A feeling of warmth in your chest. This feeling usually does not last long.  Allergic reaction to the  tracer. What happens before the test?  Ask your doctor about changing or stopping your normal medicines. This is important.  Follow instructions from your doctor about what you cannot eat or drink.  Remove your jewelry on the day of the test. What happens during the test?  An IV tube will be inserted into one of your veins.  Your doctor will give you a small amount of tracer through the IV tube.  You will wait for 20-40 minutes while the tracer moves through your bloodstream.  Your heart will be monitored with an electrocardiogram (ECG).  You will lie down on an exam table.  Pictures of your heart will be taken for about 15-20 minutes.  You may also have a stress test. For this test, one of these things may be done: ? You will be asked to exercise on a treadmill or a stationary bike. ? You will be given medicines that will make your heart work harder. This is done if you are unable to exercise.  When blood flow to your heart has peaked, a tracer will again be given through the IV tube.  After 20-40 minutes, you will get back on the exam table. More pictures will be taken of your heart.  Depending on the tracer that is used, more pictures may need to be taken 3-4 hours later.  Your IV tube will be removed when the test is over. The test may vary among doctors and hospitals. What happens after the test?  Ask your doctor: ? Whether you can return to your normal schedule, including diet, activities, and medicines. ? Whether you should drink more fluids. This will help to remove the tracer from your body. Drink enough fluid to keep your pee (urine) pale yellow.  Ask your doctor, or the department that is doing the test: ? When will my results be ready? ? How will I get my results? Summary  A cardiac nuclear scan is a test that is done to check the flow of blood to your heart.  Tell your doctor whether you are pregnant or may be pregnant.  Before the test, ask your doctor  about changing or stopping your normal medicines. This is important.  Ask your doctor whether you can return to your normal activities. You may be asked to drink more fluids. This  information is not intended to replace advice given to you by your health care provider. Make sure you discuss any questions you have with your health care provider. Document Released: 06/05/2017 Document Revised: 04/11/2018 Document Reviewed: 06/05/2017 Elsevier Patient Education  2020 [employer].      Adopting a Healthy Lifestyle.  Know what a healthy weight is for you (roughly BMI <25) and aim to maintain this   Aim for 7+ servings of fruits and vegetables daily   65-80+ fluid ounces of water or unsweet tea for healthy kidneys   Limit to max 1 drink of alcohol per day; avoid smoking/tobacco   Limit animal fats in diet for cholesterol and heart health - choose grass fed whenever available   Avoid highly processed foods, and foods high in saturated/trans fats   Aim for low stress - take time to unwind and care for your mental health   Aim for 150 min of moderate intensity exercise weekly for heart health, and weights twice weekly for bone health   Aim for 7-9 hours of sleep daily   When it comes to diets, agreement about the perfect plan isnt easy to find, even among the experts. Experts at the [employer] developed an idea known as the Healthy Eating Plate. Just imagine a plate divided into logical, healthy portions.   The emphasis is on diet quality:   Load up on vegetables and fruits - one-half of your plate: Aim for color and variety, and remember that potatoes dont count.   Go for whole grains - one-quarter of your plate: Whole wheat, barley, wheat berries, quinoa, oats, brown rice, and foods made with them. If you want pasta, go with whole wheat pasta.   Protein power - one-quarter of your plate: Fish, chicken, beans, and nuts are all healthy, versatile protein sources.  Limit red meat.   The diet, however, does go beyond the plate, offering a few other suggestions.   Use healthy plant oils, such as olive, canola, soy, corn, sunflower and peanut. Check the labels, and avoid partially hydrogenated oil, which have unhealthy trans fats.   If youre thirsty, drink water. Coffee and tea are good in moderation, but skip sugary drinks and limit milk and dairy products to one or two daily servings.   The type of carbohydrate in the diet is more important than the amount. Some sources of carbohydrates, such as vegetables, fruits, whole grains, and beans-are healthier than others.   Finally, stay active  Signed, Berniece Salines, DO  11/22/2018 11:41 AM    [place]

## 2018-11-22 NOTE — Patient Instructions (Signed)
Medication Instructions:  Your physician has recommended you make the following change in your medication:   START: Atenolol 25 mg Take 1 tab daily  *If you need a refill on your cardiac medications before your next appointment, please call your pharmacy*  Lab Work: Your physician recommends that you return for lab work in: [place]  If you have labs (blood work) drawn today and your tests are completely normal, you will receive your results only by: Marland Kitchen MyChart Message (if you have MyChart) OR . A paper copy in the mail If you have any lab test that is abnormal or we need to change your treatment, we will call you to review the results.  Testing/Procedures: Your physician has requested that you have a lexiscan myoview. For further information please visit [email or web address]. Please follow instruction sheet, as given.   Follow-Up: At [facility], you and your health needs are our priority.  As part of our continuing mission to provide you with exceptional heart care, we have created designated Provider Care Teams.  These Care Teams include your primary Cardiologist (physician) and Advanced Practice Providers (APPs -  Physician Assistants and Nurse Practitioners) who all work together to provide you with the care you need, when you need it.  Your next appointment:   3 month(s)  The format for your next appointment:   In Person  Provider:   Berniece Salines, DO  Other Instructions  Cardiac Nuclear Scan A cardiac nuclear scan is a test that is done to check the flow of blood to your heart. It is done when you are resting and when you are exercising. The test looks for problems such as:  Not enough blood reaching a portion of the heart.  The heart muscle not working as it should. You may need this test if:  You have heart disease.  You have had lab results that are not normal.  You have had heart surgery or a balloon procedure to open up blocked arteries (angioplasty).   You have chest pain.  You have shortness of breath. In this test, a special dye (tracer) is put into your bloodstream. The tracer will travel to your heart. A camera will then take pictures of your heart to see how the tracer moves through your heart. This test is usually done at a hospital and takes 2-4 hours. Tell a doctor about:  Any allergies you have.  All medicines you are taking, including vitamins, herbs, eye drops, creams, and over-the-counter medicines.  Any problems you or family members have had with anesthetic medicines.  Any blood disorders you have.  Any surgeries you have had.  Any medical conditions you have.  Whether you are pregnant or may be pregnant. What are the risks? Generally, this is a safe test. However, problems may occur, such as:  Serious chest pain and heart attack. This is only a risk if the stress portion of the test is done.  Rapid heartbeat.  A feeling of warmth in your chest. This feeling usually does not last long.  Allergic reaction to the tracer. What happens before the test?  Ask your doctor about changing or stopping your normal medicines. This is important.  Follow instructions from your doctor about what you cannot eat or drink.  Remove your jewelry on the day of the test. What happens during the test?  An IV tube will be inserted into one of your veins.  Your doctor will give you a small amount of tracer through the IV  tube.  You will wait for 20-40 minutes while the tracer moves through your bloodstream.  Your heart will be monitored with an electrocardiogram (ECG).  You will lie down on an exam table.  Pictures of your heart will be taken for about 15-20 minutes.  You may also have a stress test. For this test, one of these things may be done: ? You will be asked to exercise on a treadmill or a stationary bike. ? You will be given medicines that will make your heart work harder. This is done if you are unable to  exercise.  When blood flow to your heart has peaked, a tracer will again be given through the IV tube.  After 20-40 minutes, you will get back on the exam table. More pictures will be taken of your heart.  Depending on the tracer that is used, more pictures may need to be taken 3-4 hours later.  Your IV tube will be removed when the test is over. The test may vary among doctors and hospitals. What happens after the test?  Ask your doctor: ? Whether you can return to your normal schedule, including diet, activities, and medicines. ? Whether you should drink more fluids. This will help to remove the tracer from your body. Drink enough fluid to keep your pee (urine) pale yellow.  Ask your doctor, or the department that is doing the test: ? When will my results be ready? ? How will I get my results? Summary  A cardiac nuclear scan is a test that is done to check the flow of blood to your heart.  Tell your doctor whether you are pregnant or may be pregnant.  Before the test, ask your doctor about changing or stopping your normal medicines. This is important.  Ask your doctor whether you can return to your normal activities. You may be asked to drink more fluids. This information is not intended to replace advice given to you by your health care provider. Make sure you discuss any questions you have with your health care provider. Document Released: 06/05/2017 Document Revised: 04/11/2018 Document Reviewed: 06/05/2017 Elsevier Patient Education  2020 [employer].

## 2018-11-23 LAB — HEMOGLOBIN A1C
Est. average glucose Bld gHb Est-mCnc: 88 mg/dL
Hgb A1c MFr Bld: 4.7 % — ABNORMAL LOW (ref 4.8–5.6)

## 2018-11-28 ENCOUNTER — Telehealth (HOSPITAL_COMMUNITY): Payer: Self-pay | Admitting: *Deleted

## 2018-11-28 ENCOUNTER — Encounter (HOSPITAL_COMMUNITY): Payer: Self-pay | Admitting: *Deleted

## 2018-11-28 DIAGNOSIS — D46C Myelodysplastic syndrome with isolated del(5q) chromosomal abnormality: Secondary | ICD-10-CM | POA: Diagnosis not present

## 2018-11-28 NOTE — Telephone Encounter (Signed)
Patient given detailed instructions per Myocardial Perfusion Study Information Sheet for the test on 12/05/2018 at 0800. Patient notified to arrive 15 minutes early and that it is imperative to arrive on time for appointment to keep from having the test rescheduled.  If you need to cancel or reschedule your appointment, please call the office within 24 hours of your appointment. . Patient verbalized understanding.[name], [name] letter with instructions sent

## 2018-12-03 DIAGNOSIS — G43009 Migraine without aura, not intractable, without status migrainosus: Secondary | ICD-10-CM | POA: Diagnosis not present

## 2018-12-03 DIAGNOSIS — I10 Essential (primary) hypertension: Secondary | ICD-10-CM | POA: Diagnosis not present

## 2018-12-03 DIAGNOSIS — Z5181 Encounter for therapeutic drug level monitoring: Secondary | ICD-10-CM | POA: Diagnosis not present

## 2018-12-03 DIAGNOSIS — E782 Mixed hyperlipidemia: Secondary | ICD-10-CM | POA: Diagnosis not present

## 2018-12-03 DIAGNOSIS — K219 Gastro-esophageal reflux disease without esophagitis: Secondary | ICD-10-CM | POA: Diagnosis not present

## 2018-12-05 ENCOUNTER — Other Ambulatory Visit: Payer: Self-pay

## 2018-12-05 ENCOUNTER — Encounter: Payer: Self-pay | Admitting: *Deleted

## 2018-12-05 ENCOUNTER — Ambulatory Visit (INDEPENDENT_AMBULATORY_CARE_PROVIDER_SITE_OTHER): Payer: PPO

## 2018-12-05 DIAGNOSIS — R079 Chest pain, unspecified: Secondary | ICD-10-CM

## 2018-12-05 LAB — MYOCARDIAL PERFUSION IMAGING
LV dias vol: 90 mL (ref 46–106)
LV sys vol: 41 mL
Peak HR: 93 {beats}/min
Rest HR: 62 {beats}/min
SDS: 1
SRS: 1
SSS: 2
TID: 0.91

## 2018-12-05 MED ORDER — TECHNETIUM TC 99M TETROFOSMIN IV KIT
32.9000 | PACK | Freq: Once | INTRAVENOUS | Status: AC | PRN
  Administered 2018-12-05: 32.9 via INTRAVENOUS

## 2018-12-05 MED ORDER — TECHNETIUM TC 99M TETROFOSMIN IV KIT
10.3000 | PACK | Freq: Once | INTRAVENOUS | Status: AC | PRN
  Administered 2018-12-05: 10.3 via INTRAVENOUS

## 2018-12-05 MED ORDER — REGADENOSON 0.4 MG/5ML IV SOLN
0.4000 mg | Freq: Once | INTRAVENOUS | Status: AC
  Administered 2018-12-05: 0.4 mg via INTRAVENOUS

## 2018-12-06 ENCOUNTER — Telehealth: Payer: Self-pay | Admitting: Cardiology

## 2018-12-06 ENCOUNTER — Encounter: Payer: Self-pay | Admitting: *Deleted

## 2018-12-06 NOTE — Telephone Encounter (Signed)
Telephone call to patient. Informed her that Dr. Harriet Masson says it could be  Musculoskeletal or her lungs and recommends following up with her PCP. She has an appointment with them at the end of the month and will call back with any problems.

## 2018-12-06 NOTE — Telephone Encounter (Signed)
If her test was negative, why does her chest hurt

## 2018-12-11 DIAGNOSIS — H2513 Age-related nuclear cataract, bilateral: Secondary | ICD-10-CM | POA: Diagnosis not present

## 2018-12-11 DIAGNOSIS — H40023 Open angle with borderline findings, high risk, bilateral: Secondary | ICD-10-CM | POA: Diagnosis not present

## 2018-12-11 DIAGNOSIS — H35033 Hypertensive retinopathy, bilateral: Secondary | ICD-10-CM | POA: Diagnosis not present

## 2018-12-11 DIAGNOSIS — H35363 Drusen (degenerative) of macula, bilateral: Secondary | ICD-10-CM | POA: Diagnosis not present

## 2018-12-11 DIAGNOSIS — H524 Presbyopia: Secondary | ICD-10-CM | POA: Diagnosis not present

## 2018-12-13 DIAGNOSIS — G8929 Other chronic pain: Secondary | ICD-10-CM | POA: Diagnosis not present

## 2018-12-13 DIAGNOSIS — M25512 Pain in left shoulder: Secondary | ICD-10-CM | POA: Diagnosis not present

## 2018-12-14 ENCOUNTER — Ambulatory Visit: Payer: PPO | Admitting: Internal Medicine

## 2018-12-31 DIAGNOSIS — G43009 Migraine without aura, not intractable, without status migrainosus: Secondary | ICD-10-CM | POA: Diagnosis not present

## 2018-12-31 DIAGNOSIS — K219 Gastro-esophageal reflux disease without esophagitis: Secondary | ICD-10-CM | POA: Diagnosis not present

## 2018-12-31 DIAGNOSIS — Z5181 Encounter for therapeutic drug level monitoring: Secondary | ICD-10-CM | POA: Diagnosis not present

## 2018-12-31 DIAGNOSIS — E782 Mixed hyperlipidemia: Secondary | ICD-10-CM | POA: Diagnosis not present

## 2018-12-31 DIAGNOSIS — I10 Essential (primary) hypertension: Secondary | ICD-10-CM | POA: Diagnosis not present

## 2019-01-31 ENCOUNTER — Ambulatory Visit: Payer: PPO | Admitting: Internal Medicine

## 2019-02-22 ENCOUNTER — Ambulatory Visit: Payer: PPO | Admitting: Cardiology

## 2019-03-06 ENCOUNTER — Other Ambulatory Visit (INDEPENDENT_AMBULATORY_CARE_PROVIDER_SITE_OTHER): Payer: Medicare HMO

## 2019-03-06 ENCOUNTER — Ambulatory Visit: Payer: Medicare HMO | Admitting: Internal Medicine

## 2019-03-06 ENCOUNTER — Encounter: Payer: Self-pay | Admitting: Internal Medicine

## 2019-03-06 VITALS — BP 138/80 | HR 80 | Temp 99.3°F | Ht 62.0 in | Wt 217.2 lb

## 2019-03-06 DIAGNOSIS — Z860100 Personal history of colon polyps, unspecified: Secondary | ICD-10-CM

## 2019-03-06 DIAGNOSIS — R197 Diarrhea, unspecified: Secondary | ICD-10-CM

## 2019-03-06 DIAGNOSIS — K219 Gastro-esophageal reflux disease without esophagitis: Secondary | ICD-10-CM

## 2019-03-06 DIAGNOSIS — R109 Unspecified abdominal pain: Secondary | ICD-10-CM

## 2019-03-06 DIAGNOSIS — Z01818 Encounter for other preprocedural examination: Secondary | ICD-10-CM | POA: Diagnosis not present

## 2019-03-06 DIAGNOSIS — Z7901 Long term (current) use of anticoagulants: Secondary | ICD-10-CM

## 2019-03-06 LAB — COMPREHENSIVE METABOLIC PANEL WITH GFR
ALT: 21 U/L (ref 0–35)
AST: 21 U/L (ref 0–37)
Albumin: 4.2 g/dL (ref 3.5–5.2)
Alkaline Phosphatase: 76 U/L (ref 39–117)
BUN: 15 mg/dL (ref 6–23)
CO2: 29 meq/L (ref 19–32)
Calcium: 9.6 mg/dL (ref 8.4–10.5)
Chloride: 103 meq/L (ref 96–112)
Creatinine, Ser: 1.1 mg/dL (ref 0.40–1.20)
GFR: 50.28 mL/min — ABNORMAL LOW
Glucose, Bld: 100 mg/dL — ABNORMAL HIGH (ref 70–99)
Potassium: 4.5 meq/L (ref 3.5–5.1)
Sodium: 138 meq/L (ref 135–145)
Total Bilirubin: 0.4 mg/dL (ref 0.2–1.2)
Total Protein: 7.8 g/dL (ref 6.0–8.3)

## 2019-03-06 LAB — CBC WITH DIFFERENTIAL/PLATELET
Basophils Absolute: 0 K/uL (ref 0.0–0.1)
Basophils Relative: 0.5 % (ref 0.0–3.0)
Eosinophils Absolute: 0 K/uL (ref 0.0–0.7)
Eosinophils Relative: 0.8 % (ref 0.0–5.0)
HCT: 36 % (ref 36.0–46.0)
Hemoglobin: 12.1 g/dL (ref 12.0–15.0)
Lymphocytes Relative: 30.7 % (ref 12.0–46.0)
Lymphs Abs: 1.3 K/uL (ref 0.7–4.0)
MCHC: 33.7 g/dL (ref 30.0–36.0)
MCV: 103.6 fl — ABNORMAL HIGH (ref 78.0–100.0)
Monocytes Absolute: 0.3 K/uL (ref 0.1–1.0)
Monocytes Relative: 7.2 % (ref 3.0–12.0)
Neutro Abs: 2.5 K/uL (ref 1.4–7.7)
Neutrophils Relative %: 60.8 % (ref 43.0–77.0)
Platelets: 160 K/uL (ref 150.0–400.0)
RBC: 3.47 Mil/uL — ABNORMAL LOW (ref 3.87–5.11)
RDW: 15.9 % — ABNORMAL HIGH (ref 11.5–15.5)
WBC: 4.1 K/uL (ref 4.0–10.5)

## 2019-03-06 LAB — IGA: IgA: 261 mg/dL (ref 68–378)

## 2019-03-06 LAB — HIGH SENSITIVITY CRP: CRP, High Sensitivity: 3.02 mg/L (ref 0.000–5.000)

## 2019-03-06 MED ORDER — NA SULFATE-K SULFATE-MG SULF 17.5-3.13-1.6 GM/177ML PO SOLN: 1.0000 | Freq: Once | ORAL | 0 refills | Status: AC

## 2019-03-06 NOTE — Progress Notes (Signed)
HISTORY OF PRESENT ILLNESS:  [name] is a 64 y.o. female with multiple significant problems including myelodysplasia, hypertension, prior stroke, recurrent DVT on Xarelto (last DVT 4+ years ago), and GERD.  She presents today with chief complaints of chronic diarrhea, abdominal discomfort, and GERD.  Patient was last evaluated June 26, 2018 via telehealth medicine.  See that dictation for details.  Patient has had a norovirus type illness November 2019 and subsequent issues with diarrhea.  Around that time she had been placed on Revlimid.  Problems with chronic diarrhea felt secondary to Revlimid based on timing.  After her June evaluation, she tells me that Revlimid was discontinued.  Despite this she continued with diarrhea.  No longer on tincture of opium.  She continues to have 3-5 loose bowel movements per day.  She takes 4-6 Lomotil per day.  She tells me that her blood counts have improved.  For GERD she is on Nexium 20 mg daily.  Symptoms are controlled except for occasional breakthrough.  No dysphagia.  Some regurgitation she has had weight gain over the past year.  She does have a history of adenomatous colon polyps.  Last colonoscopy June 2014 was negative for neoplasia.  Prior exam with adenoma was 2019.  Review of hemoglobin A1c from November 2020 was 4.7.  Review of ultrasound from 2012 revealed changes in the liver consistent with hemangioma and fatty infiltration.  Regarding her diarrhea.  She has had stool studies which were negative including C. difficile.  Symptoms are exacerbated by meals.  No bleeding.  REVIEW OF SYSTEMS:  All non-GI ROS negative unless otherwise stated in the HPI except for anxiety, back pain, depression, fatigue, headaches, itching, night sweats, sleeping problems  Past Medical History:  Diagnosis Date  . Anemia   . Anxiety   . Arthritis    "knees, elbows, back" (06/22/2015)  . Bell palsy   . Chronic lower back pain   . Colon polyps   . Depression    . Fibromyalgia   . GERD (gastroesophageal reflux disease)   . High cholesterol   . History of blood transfusion 01/2014   "related to anemia"  . History of kidney stones   . Hypertension   . Migraine headache    "2-3 times/year" (06/22/2015)  . Morbid obesity ([place])   . Pulmonary embolism ([place]) 01/2008; 03/2014   "2; 1"  . Seasonal allergies   . Stroke ([place]) 03/2014   denies residual on 06/22/2015  . Tumor, thyroid 2010   x 3  . Wears glasses     Past Surgical History:  Procedure Laterality Date  . ANTERIOR LAT LUMBAR FUSION N/A 08/02/2016   Procedure: Lumbar two-three Anterolateral Lumbar interbody fusion with lateral plate;  Surgeon: Erline Levine, MD;  Location: [facility];  Service: Neurosurgery;  Laterality: N/A;  . BACK SURGERY    . BIOPSY THYROID  2010   "benign"  . BLADDER SURGERY     "for IC"  . BREAST BIOPSY Right 1999   BENIGN  . COLONOSCOPY    . COLONOSCOPY W/ POLYPECTOMY    . DIAGNOSTIC LAPAROSCOPY    . ENDOMETRIAL ABLATION    . JOINT REPLACEMENT    . LAMINECTOMY AND MICRODISCECTOMY SPINE Right 12/2008; 01/29/2009   L2-3/notes 12/11/2008; redo/notes 01/29/2009  . 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 TOTAL KNEE ARTHROPLASTY Right 06/22/2015  . TOTAL KNEE ARTHROPLASTY Right 06/22/2015   Procedure: TOTAL KNEE ARTHROPLASTY;  Surgeon: Vickey Huger, MD;  Location: [facility]  OR;  Service: Orthopedics;  Laterality: Right;  . TUBAL LIGATION Bilateral 06/2004    Social History Aaryn Jaquita Mossholder  reports that she has never smoked. She has never used smokeless tobacco. She reports current alcohol use. She reports that she does not use drugs.  family history includes Alzheimer's disease in an other family member; Cancer in her mother; Heart disease in an other family member; Hypertension in her father; Kidney disease in an other family member.  Allergies  Allergen Reactions  . Shellfish-Derived Products Swelling and Anaphylaxis  . Pineapple Swelling and Other (See Comments)     Stomach upset       PHYSICAL EXAMINATION: Vital signs: BP 138/80 (BP Location: Left Arm, Patient Position: Sitting, Cuff Size: Normal)   Pulse 80   Temp 99.3 F (37.4 C)   Ht 5\' 2"  (1.575 m) Comment: height measured without shoes  Wt 217 lb 4 oz (98.5 kg)   BMI 39.74 kg/m   Constitutional: generally well-appearing, no acute distress Psychiatric: alert and oriented x3, cooperative Eyes: extraocular movements intact, anicteric, conjunctiva pink Mouth: oral pharynx moist, no lesions Neck: supple no lymphadenopathy Cardiovascular: heart regular rate and rhythm, no murmur Lungs: clear to auscultation bilaterally Abdomen: soft, nontender, nondistended, no obvious ascites, no peritoneal signs, normal bowel sounds, no organomegaly Rectal: Deferred to colonoscopy Extremities: no clubbing, cyanosis, or lower extremity edema bilaterally Skin: no lesions on visible extremities Neuro: No focal deficits.  Cranial nerves intact  ASSESSMENT:  1.  Chronic diarrhea.  Ongoing.  Failure to respond to empiric therapies and antidiarrheals.  No improvement after discontinuation of Revlimid.  Needs further evaluation.  Rule out microscopic colitis.  Rule out celiac sprue 2.  History of adenomatous colon polyp 2019.  Negative follow-up examination 2014 3.  Multiple medical problems including myelodysplasia and recurrent DVT on Xarelto 4.  GERD.  Occasional breakthrough symptoms on Nexium 20 mg daily   PLAN:  1.  Blood work today including CBC, comprehensive metabolic panel, C-reactive protein, and celiac panel 2.  Schedule colonoscopy with biopsies.  The patient is HIGH RISK given her comorbidities and the need to address her anticoagulation therapy. 3.  Hold Xarelto 2 days prior to the procedure.  Would imagine resuming Xarelto the day of the procedure immediately post procedure.  We discussed the risks of recurrent thrombosis or thrombolic events off medication for a short period of time.  We also  discussed the risks of bleeding complications on medication.  Patient understands and consents. 4.  Reflux precautions with attention to weight loss 5.  Continue Nexium 20 mg daily 6.  May increase Nexium to 40 mg daily if breakthrough symptoms become significant 7.  If the above work-up negative including colonoscopic biopsies, consider other empiric therapy such as Xifaxan or Colestid A total time of 50 minutes was spent preparing to see the patient, reviewing test, obtaining comprehensive history, performing comprehensive physical examination, counseling the patient regarding her above listed issues and complex concurrent medical problems, ordering blood work, serologies, and advanced procedure, and documenting clinical information in the health record

## 2019-03-06 NOTE — Patient Instructions (Signed)
Your provider has requested that you go to the basement level for lab work before leaving today. Press "B" on the elevator. The lab is located at the first door on the left as you exit the elevator.   You have been scheduled for a colonoscopy. Please follow written instructions given to you at your visit today.  Please pick up your prep supplies at the pharmacy within the next 1-3 days. If you use inhalers (even only as needed), please bring them with you on the day of your procedure.   

## 2019-03-07 LAB — TISSUE TRANSGLUTAMINASE, IGA: (tTG) Ab, IgA: 1 U/mL

## 2019-03-08 ENCOUNTER — Telehealth: Payer: Self-pay | Admitting: Internal Medicine

## 2019-03-08 NOTE — Telephone Encounter (Signed)
Patient calling- has colonoscopy 3/10. states that she needs a note sent to Dr. Iona Beard at [facility] that she is okay to stop her Xarelto for the colonoscopy.

## 2019-03-08 NOTE — Telephone Encounter (Signed)
[name] are you looking for this information?

## 2019-03-11 ENCOUNTER — Other Ambulatory Visit: Payer: Self-pay

## 2019-03-11 ENCOUNTER — Telehealth: Payer: Self-pay

## 2019-03-11 ENCOUNTER — Ambulatory Visit (INDEPENDENT_AMBULATORY_CARE_PROVIDER_SITE_OTHER): Payer: Medicare HMO

## 2019-03-11 DIAGNOSIS — Z1159 Encounter for screening for other viral diseases: Secondary | ICD-10-CM

## 2019-03-11 LAB — SARS CORONAVIRUS 2 (TAT 6-24 HRS): SARS Coronavirus 2: NEGATIVE

## 2019-03-11 NOTE — Telephone Encounter (Signed)
Dr. Cleon Dew office told me Friday that the letter was in his in box and he would complete it by the end of the day.  I'm calling them back this morning.

## 2019-03-11 NOTE — Telephone Encounter (Signed)
Spoke with Mr. [name] office and clarified they were faxing letter this morning that patient could hold Xarelto for 2 days prior to procedure.  Spoke with patient and shared this information.  Patient agreed.

## 2019-03-13 ENCOUNTER — Ambulatory Visit (AMBULATORY_SURGERY_CENTER): Payer: Medicare HMO | Admitting: Internal Medicine

## 2019-03-13 ENCOUNTER — Other Ambulatory Visit: Payer: Self-pay

## 2019-03-13 ENCOUNTER — Encounter: Payer: Self-pay | Admitting: Internal Medicine

## 2019-03-13 VITALS — BP 124/62 | HR 61 | Temp 96.9°F | Resp 13 | Ht 62.0 in | Wt 217.0 lb

## 2019-03-13 DIAGNOSIS — K648 Other hemorrhoids: Secondary | ICD-10-CM | POA: Diagnosis not present

## 2019-03-13 DIAGNOSIS — K529 Noninfective gastroenteritis and colitis, unspecified: Secondary | ICD-10-CM

## 2019-03-13 DIAGNOSIS — R197 Diarrhea, unspecified: Secondary | ICD-10-CM | POA: Diagnosis not present

## 2019-03-13 DIAGNOSIS — Z86010 Personal history of colonic polyps: Secondary | ICD-10-CM

## 2019-03-13 MED ORDER — SODIUM CHLORIDE 0.9 % IV SOLN: 500.0000 mL | Freq: Once | INTRAVENOUS | Status: DC

## 2019-03-13 NOTE — Progress Notes (Signed)
Called to room to assist during endoscopic procedure.  Patient ID and intended procedure confirmed with present staff. Received instructions for my participation in the procedure from the performing physician.  

## 2019-03-13 NOTE — Patient Instructions (Signed)
Thank you for allowing [place] to care for you today!  Recommend next surveillance colonoscopy in 10 years.  Resume previous diet and medications today.  Return to your normal activities tomorrow.  Resume Xarelto today at prior dose.    YOU HAD AN ENDOSCOPIC PROCEDURE TODAY AT [facility]:   Refer to the procedure report that was given to you for any specific questions about what was found during the examination.  If the procedure report does not answer your questions, please call your gastroenterologist to clarify.  If you requested that your care partner not be given the details of your procedure findings, then the procedure report has been included in a sealed envelope for you to review at your convenience later.  YOU SHOULD EXPECT: Some feelings of bloating in the abdomen. Passage of more gas than usual.  Walking can help get rid of the air that was put into your GI tract during the procedure and reduce the bloating. If you had a lower endoscopy (such as a colonoscopy or flexible sigmoidoscopy) you may notice spotting of blood in your stool or on the toilet paper. If you underwent a bowel prep for your procedure, you may not have a normal bowel movement for a few days.  Please Note:  You might notice some irritation and congestion in your nose or some drainage.  This is from the oxygen used during your procedure.  There is no need for concern and it should clear up in a day or so.  SYMPTOMS TO REPORT IMMEDIATELY:   Following lower endoscopy (colonoscopy or flexible sigmoidoscopy):  Excessive amounts of blood in the stool  Significant tenderness or worsening of abdominal pains  Swelling of the abdomen that is new, acute  Fever of 100F or higher  For urgent or emergent issues, a gastroenterologist can be reached at any hour by calling [phone]. Do not use MyChart messaging for urgent concerns.    DIET:  We do recommend a small meal at first, but then you may  proceed to your regular diet.  Drink plenty of fluids but you should avoid alcoholic beverages for 24 hours.  ACTIVITY:  You should plan to take it easy for the rest of today and you should NOT DRIVE or use heavy machinery until tomorrow (because of the sedation medicines used during the test).    FOLLOW UP: Our staff will call the number listed on your records 48-72 hours following your procedure to check on you and address any questions or concerns that you may have regarding the information given to you following your procedure. If we do not reach you, we will leave a message.  We will attempt to reach you two times.  During this call, we will ask if you have developed any symptoms of COVID 19. If you develop any symptoms (ie: fever, flu-like symptoms, shortness of breath, cough etc.) before then, please call [phone].  If you test positive for Covid 19 in the 2 weeks post procedure, please call and report this information to [place].    If any biopsies were taken you will be contacted by phone or by letter within the next 1-3 weeks.  Please call us at [phone] if you have not heard about the biopsies in 3 weeks.    SIGNATURES/CONFIDENTIALITY: You and/or your care partner have signed paperwork which will be entered into your electronic medical record.  These signatures attest to the fact that that the information above on your After Visit  Summary has been reviewed and is understood.  Full responsibility of the confidentiality of this discharge information lies with you and/or your care-partner. 

## 2019-03-13 NOTE — Progress Notes (Signed)
Pt's states no medical or surgical changes since previsit or office visit.  JB - temp CW - vitals. 

## 2019-03-13 NOTE — Progress Notes (Signed)
pt tolerated well. VSS. awake and to recovery. Report given to RN.  

## 2019-03-13 NOTE — Op Note (Signed)
[place] Patient Name: [name] Procedure Date: 03/13/2019 9:28 AM MRN: [id number] Endoscopist: Docia Chuck. Henrene Pastor , MD Age: 64 Referring MD:  Date of Birth: Jul 12, 1955 Gender: Female Account #: [id number] Procedure:                Colonoscopy with biopsies Indications:              Chronic diarrhea Medicines:                Monitored Anesthesia Care Procedure:                Pre-Anesthesia Assessment:                           - Prior to the procedure, a History and Physical                            was performed, and patient medications and                            allergies were reviewed. The patient's tolerance of                            previous anesthesia was also reviewed. The risks                            and benefits of the procedure and the sedation                            options and risks were discussed with the patient.                            All questions were answered, and informed consent                            was obtained. Prior Anticoagulants: The patient has                            taken Xarelto (rivaroxaban), last dose was 4 days                            prior to procedure. ASA Grade Assessment: III - A                            patient with severe systemic disease. After                            reviewing the risks and benefits, the patient was                            deemed in satisfactory condition to undergo the                            procedure.  After obtaining informed consent, the colonoscope                            was passed under direct vision. Throughout the                            procedure, the patient's blood pressure, pulse, and                            oxygen saturations were monitored continuously. The                            Colonoscope was introduced through the anus and                            advanced to the the cecum, identified by      appendiceal orifice and ileocecal valve. The                            terminal ileum, ileocecal valve, appendiceal                            orifice, and rectum were photographed. The quality                            of the bowel preparation was excellent. The                            colonoscopy was performed without difficulty. The                            patient tolerated the procedure well. The bowel                            preparation used was SUPREP via split dose                            instruction. Scope In: 9:39:24 AM Scope Out: 9:49:17 AM Scope Withdrawal Time: 0 hours 6 minutes 22 seconds  Total Procedure Duration: 0 hours 9 minutes 53 seconds  Findings:                 The terminal ileum appeared normal.                           The entire examined colon appeared normal on direct                            and retroflexion views. Biopsies for histology were                            taken with a cold forceps from the entire colon for                            evaluation of microscopic  colitis.                           Internal hemorrhoids were found during                            retroflexion. The hemorrhoids were small. Complications:            No immediate complications. Estimated blood loss:                            None. Estimated Blood Loss:     Estimated blood loss: none. Impression:               - The examined portion of the ileum was normal.                           - The entire examined colon is normal on direct and                            retroflexion views.                           - Internal hemorrhoids. Recommendation:           - Repeat colonoscopy in 10 years for surveillance.                           - Resume Xarelto (rivaroxaban) today at prior dose.                           - Patient has a contact number available for                            emergencies. The signs and symptoms of potential                             delayed complications were discussed with the                            patient. Return to normal activities tomorrow.                            Written discharge instructions were provided to the                            patient.                           - Resume previous diet.                           - Continue present medications.                           - Await pathology results. We will contact you with  the results and further recommendations. Docia Chuck. Henrene Pastor, MD 03/13/2019 9:56:22 AM This report has been signed electronically.

## 2019-03-15 ENCOUNTER — Telehealth: Payer: Self-pay

## 2019-03-15 NOTE — Telephone Encounter (Signed)
  Follow up Call-  Call back number 03/13/2019  Post procedure Call Back phone  # [phone]  Permission to leave phone message Yes  Some recent data might be hidden     Patient questions:  Do you have a fever, pain , or abdominal swelling? No. Pain Score  0 *  Have you tolerated food without any problems? Yes.    Have you been able to return to your normal activities? Yes.    Do you have any questions about your discharge instructions: Diet   No. Medications  No. Follow up visit  No.  Do you have questions or concerns about your Care? No.  Actions: * If pain score is 4 or above: No action needed, pain <4.  1. Have you developed a fever since your procedure? no  2.   Have you had an respiratory symptoms (SOB or cough) since your procedure? no  3.   Have you tested positive for COVID 19 since your procedure no  4.   Have you had any family members/close contacts diagnosed with the COVID 19 since your procedure?  no   If yes to any of these questions please route to Joylene John, RN and Alphonsa Gin, Therapist, sports.

## 2019-03-19 ENCOUNTER — Other Ambulatory Visit: Payer: Self-pay

## 2019-03-19 ENCOUNTER — Telehealth: Payer: Self-pay | Admitting: Internal Medicine

## 2019-03-19 ENCOUNTER — Encounter: Payer: Self-pay | Admitting: Internal Medicine

## 2019-03-19 MED ORDER — COLESTIPOL HCL 1 G PO TABS: 2.0000 g | ORAL_TABLET | Freq: Two times a day (BID) | ORAL | 3 refills | Status: DC

## 2019-03-19 MED ORDER — CHOLESTYRAMINE 4 G PO PACK: 4.0000 g | PACK | Freq: Two times a day (BID) | ORAL | 6 refills | Status: DC

## 2019-03-19 NOTE — Telephone Encounter (Signed)
Then Questran 4 g twice daily

## 2019-03-19 NOTE — Telephone Encounter (Signed)
Pt reported that she is unable to take colestipol because the tablets are too large and that they cause her to have heartburn.

## 2019-03-19 NOTE — Telephone Encounter (Signed)
The patient has been notified that [name] has been sent in place of colestid. The pt has been advised of the information and verbalized understanding.

## 2019-03-19 NOTE — Telephone Encounter (Signed)
Dr Henrene Pastor please advise. Pt states she is unable to take due to size of tablet, and it causes heart burn.

## 2019-04-24 ENCOUNTER — Ambulatory Visit: Payer: Medicare HMO | Admitting: Internal Medicine

## 2019-04-28 ENCOUNTER — Encounter (INDEPENDENT_AMBULATORY_CARE_PROVIDER_SITE_OTHER): Payer: Self-pay

## 2019-05-22 ENCOUNTER — Ambulatory Visit: Payer: Medicare HMO | Admitting: Internal Medicine

## 2019-05-22 ENCOUNTER — Encounter: Payer: Self-pay | Admitting: Internal Medicine

## 2019-05-22 VITALS — BP 124/80 | HR 68 | Ht 62.0 in | Wt 226.0 lb

## 2019-05-22 DIAGNOSIS — R197 Diarrhea, unspecified: Secondary | ICD-10-CM

## 2019-05-22 MED ORDER — CHOLESTYRAMINE 4 G PO PACK: 4.0000 g | PACK | Freq: Two times a day (BID) | ORAL | 3 refills | Status: DC

## 2019-05-22 NOTE — Progress Notes (Signed)
HISTORY OF PRESENT ILLNESS:  [name] is a 64 y.o. female with multiple significant medical problems including myelodysplasia, hypertension, prior stroke, recurrent DVT on Xarelto, and GERD.  She has been evaluated in this office on several occasions for chronic persistent diarrhea.  Last seen in the office regarding the same March 06, 2019.  See that dictation for details.  Blood work was unrevealing.  She subsequently underwent complete colonoscopy with biopsies.  Colonoscopy, including intubation of the terminal ileum, was entirely normal.  Internal hemorrhoids noted.  Random colon biopsies were normal.  She was prescribed Colestid but this was converted to Questran 4 g twice daily, due to insurance purposes.  Also Lomotil as needed.  She presents today for follow-up.  Patient tells me that [name] has been quite effective.  As a matter fact, when taken regularly, causes constipation.  Currently taking 1 dose every other day.  Not requiring Lomotil.  No new complaints.  Her GERD remains under control with PPI therapy  REVIEW OF SYSTEMS:  All non-GI ROS negative unless otherwise stated in the HPI except for anxiety, back pain, fatigue, depression  Past Medical History:  Diagnosis Date  . Anemia   . Anxiety   . Arthritis    "knees, elbows, back" (06/22/2015)  . Bell palsy   . Chronic lower back pain   . Colon polyps   . Depression   . Fibromyalgia   . GERD (gastroesophageal reflux disease)   . High cholesterol   . History of blood transfusion 01/2014   "related to anemia"  . History of kidney stones   . Hypertension   . Migraine headache    "2-3 times/year" (06/22/2015)  . Morbid obesity ([place])   . Pulmonary embolism ([place]) 01/2008; 03/2014   "2; 1"  . Seasonal allergies   . Stroke ([place]) 03/2014   denies residual on 06/22/2015  . Tumor, thyroid 2010   x 3  . Wears glasses     Past Surgical History:  Procedure Laterality Date  . ANTERIOR LAT LUMBAR FUSION N/A 08/02/2016    Procedure: Lumbar two-three Anterolateral Lumbar interbody fusion with lateral plate;  Surgeon: Erline Levine, MD;  Location: [facility];  Service: Neurosurgery;  Laterality: N/A;  . BACK SURGERY    . BIOPSY THYROID  2010   "benign"  . BLADDER SURGERY     "for IC"  . BREAST BIOPSY Right 1999   BENIGN  . COLONOSCOPY    . COLONOSCOPY W/ POLYPECTOMY    . DIAGNOSTIC LAPAROSCOPY    . ENDOMETRIAL ABLATION    . JOINT REPLACEMENT    . LAMINECTOMY AND MICRODISCECTOMY SPINE Right 12/2008; 01/29/2009   L2-3/notes 12/11/2008; redo/notes 01/29/2009  . 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 TOTAL KNEE ARTHROPLASTY Right 06/22/2015  . TOTAL KNEE ARTHROPLASTY Right 06/22/2015   Procedure: TOTAL KNEE ARTHROPLASTY;  Surgeon: Vickey Huger, MD;  Location: [facility];  Service: Orthopedics;  Laterality: Right;  . TUBAL LIGATION Bilateral 06/2004    Social History [name]  reports that she has never smoked. She has never used smokeless tobacco. She reports current alcohol use. She reports that she does not use drugs.  family history includes Alzheimer's disease in an other family member; Cancer in her mother; Heart disease in an other family member; Hypertension in her father; Kidney disease in an other family member.  Allergies  Allergen Reactions  . Shellfish-Derived Products Swelling and Anaphylaxis  . Pineapple Swelling and Other (See Comments)  Stomach upset       PHYSICAL EXAMINATION: Vital signs: BP 124/80 (BP Location: Left Arm, Patient Position: Sitting, Cuff Size: Normal)   Pulse 68   Ht 5\' 2"  (1.575 m)   Wt 226 lb (102.5 kg)   BMI 41.34 kg/m   Constitutional: generally well-appearing, no acute distress Psychiatric: alert and oriented x3, cooperative Eyes: extraocular movements intact, anicteric, conjunctiva pink Mouth: oral pharynx moist, no lesions Neck: supple no lymphadenopathy Cardiovascular: heart regular rate and rhythm, no murmur Lungs: clear to auscultation  bilaterally Abdomen: soft, nontender, nondistended, no obvious ascites, no peritoneal signs, normal bowel sounds, no organomegaly Rectal: Normal at recent colonoscopy Extremities: no clubbing, cyanosis, or lower extremity edema bilaterally Skin: no lesions on visible extremities Neuro: No focal deficits.  Cranial nerves intact  ASSESSMENT:  1.  Chronic diarrhea.  Appears bowel salt related given the response to Questran. 2.  GERD.  Symptoms controlled on PPI 3.  Colonoscopy March 2021 was normal   PLAN:  1.  Continue Questran.  Prescription refilled.  Titrate to need 2.  Reflux precautions 3.  Continue Nexium 4.  Routine GI follow-up 1 year.  Sooner if needed A total time of 30 minutes was spent preparing to see the patient, reviewing test, colonoscopy report, histology, obtaining comprehensive history, performing medically appropriate exam, counseling the patient regarding her above listed conditions, ordering medications and reviewing their effects and risks, and documenting clinical information in the health record

## 2019-05-22 NOTE — Patient Instructions (Signed)
If you are age 63 or older, your body mass index should be between 23-30. Your Body mass index is 41.34 kg/m. If this is out of the aforementioned range listed, please consider follow up with your Primary Care Provider.  If you are age 64 or younger, your body mass index should be between 19-25. Your Body mass index is 41.34 kg/m. If this is out of the aformentioned range listed, please consider follow up with your Primary Care Provider.    We have sent the following medications to your pharmacy for you to pick up at your convenience:  Questran  Please follow up as needed

## 2019-06-19 ENCOUNTER — Other Ambulatory Visit: Payer: Self-pay | Admitting: Neurosurgery

## 2019-07-02 NOTE — H&P (Signed)
Patient ID:   [id number] Patient: [name]  Date of Birth: [birth year] Visit Type: Office Visit   Date: 06/19/2019 10:15 AM Provider: Marchia Meiers. Vertell Limber MD   This 64 year old female presents for back and neck and arm pain.  HISTORY OF PRESENT ILLNESS: 1.  back, neck and arm pain  Patient returns to review her EMG.   She reports persistent neck pain with numbness and tingling in both hands.  Cervical imaging demonstrates bilateral C4-5, C5-6, C6-7 levels affected, left greater than right with milder foraminal narrowing at C7-T1 level on right  Physical examination reveals positive Spurling maneuver to the left with left deltoid strength 4+ out of 5, left biceps strength 4/5, left triceps 4/5  The patient describes persistent numbness in both her hands and grades her pain at 5/10 in severity  Her EMG testing revealed very severe right and moderate to severe left carpal tunnel syndrome on the right in addition to motor demyelination and mild Axon loss there is profound sensory axonal loss with no sensory response is unobtainable.  On the left there is sensory and motor demyelination with some sensory axonal loss      Medical/Surgical/Interim History Reviewed, no change.  Last detailed document date:08/29/2018.     PAST MEDICAL HISTORY, SURGICAL HISTORY, FAMILY HISTORY, SOCIAL HISTORY AND REVIEW OF SYSTEMS I have reviewed the patient's past medical, surgical, family and social history as well as the comprehensive review of systems as included on the [facility] history form dated 06/19/2019, which I have signed.  Family History: Reviewed, no changes.  Last detailed document date:08/29/2018.   Social History: Reviewed, no changes. Last detailed document date: 08/29/2018.    MEDICATIONS: (added, continued or stopped this visit) Started Medication Directions Instruction Stopped  allopurinol 100 mg tablet take 1 tablet by oral route  every  day    bupropion HCl XL 300 mg 24 hr tablet, extended release take 1 tablet by oral route  every day    colestipol 1 gram tablet take 2 tablet by oral route 2 times every day swallowing whole with any liquid. Do not crush, chew and/or divide.    gabapentin 600 mg tablet take 1 tablet by oral route 3 times every day    Lomotil 2.5 mg-0.025 mg tablet take 2 tablet by oral route 4 times every day as needed   07/19/2018 omeprazole 40 mg capsule,delayed release     potassium chloride ER 10 mEq capsule,extended release take 1 capsule by oral route  every day with food    simvastatin 20 mg tablet take 1 tablet by oral route  every day in the evening    sumatriptan 25 mg tablet take 1 tablet by oral route after onset of migraine; may repeat after 2 hours if headache returns,not to exceed 200mg  in 24hrs   10/10/2018 Ubrelvy 100 mg tablet     vitamin D  BUCCAL     Xarelto 20 mg tablet take 1 tablet by oral route  every day with the evening meal    Zoloft 100 mg tablet take 1 1/2 tablet by oral route  every day    zolpidem 10 mg tablet take 1 tablet by oral route  every day at bedtime as needed      ALLERGIES: Ingredient Reaction Medication Name Comment [place]     Reviewed, no changes.    PHYSICAL EXAM:  Vitals Date Temp F BP Pulse Ht In Wt Lb BMI BSA  Pain Score 06/19/2019  128/77 72 63 224 39.68  5/10     IMPRESSION:  I have recommended surgical intervention to the patient.  This will consist of a 2 staged surgery.  The 1st will be anterior cervical decompression and fusion involving the C4-5, C5-6 and C6-7 level and right carpal tunnel release since this is most severely affected level.  The 2nd stage will be a left carpal tunnel release  PLAN: The risks and benefits were discussed in detail with patient and she wishes proceed with surgery.  We are going to arrange for a preoperative  a anesthesia because of problems with previous anesthetics including nausea  Orders: Diagnostic Procedures: Assessment Procedure M54.12 Cervical Spine- Lateral Instruction(s)/Education: Assessment Instruction I10 Lifestyle education Z68.39 Dietary management education, guidance, and counseling Miscellaneous: Assessment  M48.02 Hard Cervical Collar  Completed Orders (this encounter) Order Details Reason Side Interpretation Result Initial Treatment Date Region Lifestyle education Patient will follow up with Primary Care Physician.       Dietary management education, guidance, and counseling Encouraged patient to eat well balanced diet.        Assessment/Plan  # Detail Type Description  1. Assessment Cervicalgia (M54.2).     2. Assessment Radiculopathy, cervical region (M54.12).     3. Assessment Spinal stenosis, cervical region (M48.02).  Plan Orders Hard Cervical Collar. Clinical information/comments: given to pt.     4. Assessment Bilateral carpal tunnel syndrome (G56.03).     5. Assessment Essential (primary) hypertension (I10).     6. Assessment Body mass index (BMI) 39.0-39.9, adult (Z68.39).  Plan Orders Today's instructions / counseling include(s) Dietary management education, guidance, and counseling. Clinical information/comments: Encouraged patient to eat well balanced diet.       Pain Management Plan Pain Scale: 5/10. Method: Numeric Pain Intensity Scale. Location: back, neck, arms. Onset: 09/03/2013. Duration: varies. Quality: discomforting. Pain management follow-up plan of care: Patient will continue medication management..              Provider:  Marchia Meiers. Vertell Limber MD  06/22/2019 04:26 PM    Dictation edited by: Marchia Meiers. Vertell Limber    CC Providers: Celedonio Miyamoto [facility] [place],  [place]  [id number]   Erline Levine MD  [address] [place], [place] [address]               Electronically signed by Marchia Meiers. Vertell Limber MD on 06/22/2019 04:27 PM

## 2019-07-05 NOTE — Pre-Procedure Instructions (Signed)
Angelik Ranessa Kosta  07/05/2019      [facility] - [place], [place] [address] [address] Phone: [phone] Fax: [place], [address] [place] [address] Phone: [phone] Fax: [phone]    Your procedure is scheduled on 07/11/19.  Report to [facility] Admitting at 530 A.M.  Call this number if you have problems the morning of surgery:  [phone]   Remember:  Do not eat or drink after midnight.     Take these medicines the morning of surgery with A SIP OF WATER ---  esomeprazole (NEXIUM 24HR) 20   gabapentin (NEURONTIN) 600 MG   HYDROcodone-acetaminophen (   Do not wear jewelry, make-up or nail polish.  Do not wear lotions, powders, or perfumes, or deodorant.  Do not shave 48 hours prior to surgery.  Men may shave face and neck.  Do not bring valuables to the hospital.  [facility] is not responsible for any belongings or valuables.  Contacts, dentures or bridgework may not be worn into surgery.  Leave your suitcase in the car.  After surgery it may be brought to your room.  For patients admitted to the hospital, discharge time will be determined by your treatment team.  Patients discharged the day of surgery will not be allowed to drive home.   Do not take any aspirin,anti-inflammatories,vitamins,or herbal supplements 5-7 days prior to surgery.Do not take any   Please read over the following fact sheets that you were given. Coughing and Deep Breathing and Surgical Site Infection Prevention [place] - Preparing for Surgery  Before surgery, you can play an important role.  Because skin is not sterile, your skin needs to be as free of germs as possible.  You can reduce the number of germs on you skin by washing with CHG (chlorahexidine gluconate) soap before surgery.  CHG is an antiseptic cleaner which kills germs and bonds  with the skin to continue killing germs even after washing.  Oral Hygiene is also important in reducing the risk of infection.  Remember to brush your teeth with your regular toothpaste the morning of surgery.  Please DO NOT use if you have an allergy to CHG or antibacterial soaps.  If your skin becomes reddened/irritated stop using the CHG and inform your nurse when you arrive at Short Stay.  Do not shave (including legs and underarms) for at least 48 hours prior to the first CHG shower.  You may shave your face.  Please follow these instructions carefully:   1.  Shower with CHG Soap the night before surgery and the morning of Surgery.  2.  If you choose to wash your hair, wash your hair first as usual with your normal shampoo.  3.  After you shampoo, rinse your hair and body thoroughly to remove the shampoo. 4.  Use CHG as you would any other liquid soap.  You can apply chg directly to the skin and wash gently with a      scrungie or washcloth.           5.  Apply the CHG Soap to your body ONLY FROM THE NECK DOWN.   Do not use on open wounds or open sores. Avoid contact with your eyes, ears, mouth and genitals (private parts).  Wash genitals (private parts) with your normal soap.  6.  Wash thoroughly, paying special  attention to the area where your surgery will be performed.  7.  Thoroughly rinse your body with warm water from the neck down.  8.  DO NOT shower/wash with your normal soap after using and rinsing off the CHG Soap.  9.  Pat yourself dry with a clean towel.            10.  Wear clean pajamas.            11.  Place clean sheets on your bed the night of your first shower and do not sleep with pets.  Day of Surgery  Do not apply any lotions/deoderants the morning of surgery.   Please wear clean clothes to the hospital/surgery center. Remember to brush your teeth with toothpaste.

## 2019-07-09 ENCOUNTER — Encounter (HOSPITAL_COMMUNITY)
Admission: RE | Admit: 2019-07-09 | Discharge: 2019-07-09 | Disposition: A | Discharge status: Home / Self Care | Payer: Medicare HMO | Source: Ambulatory Visit | Attending: Neurosurgery | Admitting: Neurosurgery

## 2019-07-09 ENCOUNTER — Other Ambulatory Visit: Payer: Self-pay

## 2019-07-09 ENCOUNTER — Other Ambulatory Visit (HOSPITAL_COMMUNITY)
Admission: RE | Admit: 2019-07-09 | Discharge: 2019-07-09 | Disposition: A | Discharge status: Home / Self Care | Payer: Medicare HMO | Source: Ambulatory Visit | Attending: Neurosurgery | Admitting: Neurosurgery

## 2019-07-09 ENCOUNTER — Encounter (HOSPITAL_COMMUNITY): Payer: Self-pay

## 2019-07-09 DIAGNOSIS — Z86711 Personal history of pulmonary embolism: Secondary | ICD-10-CM | POA: Diagnosis not present

## 2019-07-09 DIAGNOSIS — I10 Essential (primary) hypertension: Secondary | ICD-10-CM | POA: Insufficient documentation

## 2019-07-09 DIAGNOSIS — Z79899 Other long term (current) drug therapy: Secondary | ICD-10-CM | POA: Diagnosis not present

## 2019-07-09 DIAGNOSIS — Z7901 Long term (current) use of anticoagulants: Secondary | ICD-10-CM | POA: Insufficient documentation

## 2019-07-09 DIAGNOSIS — Z01812 Encounter for preprocedural laboratory examination: Secondary | ICD-10-CM | POA: Diagnosis present

## 2019-07-09 DIAGNOSIS — Z20822 Contact with and (suspected) exposure to covid-19: Secondary | ICD-10-CM | POA: Diagnosis not present

## 2019-07-09 DIAGNOSIS — E785 Hyperlipidemia, unspecified: Secondary | ICD-10-CM | POA: Diagnosis not present

## 2019-07-09 HISTORY — DX: Other complications of anesthesia, initial encounter: T88.59XA

## 2019-07-09 HISTORY — DX: Diarrhea, unspecified: R19.7

## 2019-07-09 HISTORY — DX: Myelodysplastic syndrome, unspecified: D46.9

## 2019-07-09 LAB — CBC
HCT: 38.1 % (ref 36.0–46.0)
Hemoglobin: 12.1 g/dL (ref 12.0–15.0)
MCH: 33.4 pg (ref 26.0–34.0)
MCHC: 31.8 g/dL (ref 30.0–36.0)
MCV: 105.2 fL — ABNORMAL HIGH (ref 80.0–100.0)
Platelets: 150 10*3/uL (ref 150–400)
RBC: 3.62 MIL/uL — ABNORMAL LOW (ref 3.87–5.11)
RDW: 14.1 % (ref 11.5–15.5)
WBC: 4.2 10*3/uL (ref 4.0–10.5)
nRBC: 0 % (ref 0.0–0.2)

## 2019-07-09 LAB — BASIC METABOLIC PANEL WITH GFR
Anion gap: 9 (ref 5–15)
BUN: 15 mg/dL (ref 8–23)
CO2: 26 mmol/L (ref 22–32)
Calcium: 9.2 mg/dL (ref 8.9–10.3)
Chloride: 104 mmol/L (ref 98–111)
Creatinine, Ser: 1.04 mg/dL — ABNORMAL HIGH (ref 0.44–1.00)
GFR calc Af Amer: >60 mL/min (ref >60)
GFR calc non Af Amer: 58 mL/min — ABNORMAL LOW (ref >60)
Glucose, Bld: 111 mg/dL — ABNORMAL HIGH (ref 70–99)
Potassium: 4.2 mmol/L (ref 3.5–5.1)
Sodium: 139 mmol/L (ref 135–145)

## 2019-07-09 LAB — SURGICAL PCR SCREEN
MRSA, PCR: NEGATIVE
Staphylococcus aureus: POSITIVE — AB

## 2019-07-09 LAB — TYPE AND SCREEN
ABO/RH(D): A POS
Antibody Screen: NEGATIVE

## 2019-07-09 LAB — SARS CORONAVIRUS 2 (TAT 6-24 HRS): SARS Coronavirus 2: NEGATIVE

## 2019-07-09 MED ORDER — CHLORHEXIDINE GLUCONATE CLOTH 2 % EX PADS: 6.0000 | MEDICATED_PAD | Freq: Once | CUTANEOUS | Status: DC

## 2019-07-09 NOTE — Progress Notes (Signed)
PCP - [facility] IN [place] Cardiologist - NA  -  -  Chest x-ray - NA EKG - 11/20 Stress Test - 12/20 ECHO - 11/20 Cardiac Cath -  NA  -     -  Blood Thinner Instructions:    XARELTO STOPPED 7/2   COVID TEST- TODAY   Anesthesia review: EKG  Patient denies shortness of breath, fever, cough and chest pain at PAT appointment   All instructions explained to the patient, with a verbal understanding of the material. Patient agrees to go over the instructions while at home for a better understanding. Patient also instructed to self quarantine after being tested for COVID-19. The opportunity to ask questions was provided.

## 2019-07-10 NOTE — Progress Notes (Signed)
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  Recently seen by cardiologist Dr. Harriet Masson 11/22/18 for eval of intermittent midsternal pain. Pt was restarted on atenolol 25mg  daily for HTN and nuclear stress test was ordered which was done 12/2 and showed EF 54%, no evidence of ischemia, low risk study.  Patient also had an echo 11/07/2018 that was ordered by her PCP which showed normal LVEF 20-10%, normal diastolic function, trace MR.  Patient reported last dose Xarelto 7/2.  History of myelodysplastic syndrome, preop CBC unremarkable.  Remainder of labs unremarkable.  EKG 11/22/2018: Sinus tachycardia.  Rate 102.  Nonspecific ST and T wave abnormality.  Nuclear stress 12/05/2018:  Nuclear stress EF: 54%.  The left ventricular ejection fraction is mildly decreased (45-54%).  There was no ST segment deviation noted during stress.  No T wave inversion was noted during stress.  Defect 1: There is a small fixed defect of mild severity present in the mid anteroseptal location.  The study is normal. The small fixed defect as described above is likely a soft tissue attenuation  This is a low risk study.    TTE 11/07/2018: Conclusions: 1.  Normal left ventricular size. 2.  Normal left ventricular systolic function. 3.  Ejection fraction is visually estimated 60-65%. 4.  Normal diastolic function. 5.  Normal right ventricular size and systolic function. 6.  Trace mitral regurgitation.  Wynonia Musty [facility] Short Stay Center/Anesthesiology Phone [phone] 07/10/2019 9:33 AM

## 2019-07-10 NOTE — Anesthesia Preprocedure Evaluation (Addendum)
Anesthesia Evaluation  Patient identified by MRN, date of birth, ID band Patient awake    Reviewed: Allergy & Precautions, NPO status , Patient's Chart, lab work & pertinent test results, reviewed documented beta blocker date and time   History of Anesthesia Complications Negative for: history of anesthetic complications  Airway Mallampati: IV  TM Distance: >3 FB Neck ROM: Limited    Dental  (+) Dental Advisory Given, Teeth Intact   Pulmonary PE   Pulmonary exam normal        Cardiovascular hypertension, Pt. on home beta blockers and Pt. on medications Normal cardiovascular exam   '20 Myoperfusion -  Nuclear stress EF: 54%.  The left ventricular ejection fraction is mildly decreased (45-54%).  There was no ST segment deviation noted during stress.  No T wave inversion was noted during stress.  Defect 1: There is a small fixed defect of mild severity present in the mid anteroseptal location.  The study is normal. The small fixed defect as described above is likely a soft tissue attenuation  This is a low risk study.    Neuro/Psych  Headaches, PSYCHIATRIC DISORDERS Anxiety Depression  Bells palsy   Neuromuscular disease (carpal tunnel syndrome) CVA, No Residual Symptoms    GI/Hepatic Neg liver ROS, GERD  Controlled and Medicated,  Endo/Other  Morbid obesity  Renal/GU negative Renal ROS     Musculoskeletal  (+) Arthritis , Fibromyalgia -  Abdominal   Peds  Hematology  MDS On xarelto, last dose 7/2    Anesthesia Other Findings Covid test negative   Reproductive/Obstetrics                           Anesthesia Physical Anesthesia Plan  ASA: III  Anesthesia Plan: General   Post-op Pain Management:    Induction: Intravenous  PONV Risk Score and Plan: 4 or greater and Treatment may vary due to age or medical condition, Ondansetron, Dexamethasone and Midazolam  Airway  Management Planned: Oral ETT and Video Laryngoscope Planned  Additional Equipment: None  Intra-op Plan:   Post-operative Plan: Extubation in OR  Informed Consent: I have reviewed the patients History and Physical, chart, labs and discussed the procedure including the risks, benefits and alternatives for the proposed anesthesia with the patient or authorized representative who has indicated his/her understanding and acceptance.     Dental advisory given  Plan Discussed with: CRNA and Anesthesiologist  Anesthesia Plan Comments:       Anesthesia Quick Evaluation

## 2019-07-11 ENCOUNTER — Encounter (HOSPITAL_COMMUNITY)
Admission: RE | Disposition: A | Discharge status: Home / Self Care | Payer: Self-pay | Source: Home / Self Care | Attending: Neurosurgery

## 2019-07-11 ENCOUNTER — Ambulatory Visit (HOSPITAL_COMMUNITY): Payer: Medicare HMO | Admitting: Physician Assistant

## 2019-07-11 ENCOUNTER — Ambulatory Visit (HOSPITAL_COMMUNITY): Payer: Medicare HMO

## 2019-07-11 ENCOUNTER — Ambulatory Visit (HOSPITAL_COMMUNITY): Payer: Medicare HMO | Admitting: Certified Registered Nurse Anesthetist

## 2019-07-11 ENCOUNTER — Encounter (HOSPITAL_COMMUNITY): Payer: Self-pay | Admitting: Neurosurgery

## 2019-07-11 ENCOUNTER — Other Ambulatory Visit: Payer: Self-pay

## 2019-07-11 ENCOUNTER — Observation Stay (HOSPITAL_COMMUNITY)
Admission: RE | Admit: 2019-07-11 | Discharge: 2019-07-12 | Disposition: A | Discharge status: Home / Self Care | Payer: Medicare HMO | Attending: Neurosurgery | Admitting: Neurosurgery

## 2019-07-11 DIAGNOSIS — Z79899 Other long term (current) drug therapy: Secondary | ICD-10-CM | POA: Diagnosis not present

## 2019-07-11 DIAGNOSIS — M50221 Other cervical disc displacement at C4-C5 level: Secondary | ICD-10-CM | POA: Diagnosis not present

## 2019-07-11 DIAGNOSIS — Z419 Encounter for procedure for purposes other than remedying health state, unspecified: Secondary | ICD-10-CM

## 2019-07-11 DIAGNOSIS — M50223 Other cervical disc displacement at C6-C7 level: Secondary | ICD-10-CM | POA: Diagnosis not present

## 2019-07-11 DIAGNOSIS — Z7901 Long term (current) use of anticoagulants: Secondary | ICD-10-CM | POA: Insufficient documentation

## 2019-07-11 DIAGNOSIS — M50123 Cervical disc disorder at C6-C7 level with radiculopathy: Secondary | ICD-10-CM | POA: Insufficient documentation

## 2019-07-11 DIAGNOSIS — M4802 Spinal stenosis, cervical region: Secondary | ICD-10-CM | POA: Diagnosis present

## 2019-07-11 DIAGNOSIS — M50222 Other cervical disc displacement at C5-C6 level: Secondary | ICD-10-CM | POA: Diagnosis not present

## 2019-07-11 DIAGNOSIS — M5020 Other cervical disc displacement, unspecified cervical region: Secondary | ICD-10-CM | POA: Diagnosis present

## 2019-07-11 DIAGNOSIS — M50122 Cervical disc disorder at C5-C6 level with radiculopathy: Secondary | ICD-10-CM | POA: Insufficient documentation

## 2019-07-11 DIAGNOSIS — I10 Essential (primary) hypertension: Secondary | ICD-10-CM | POA: Insufficient documentation

## 2019-07-11 DIAGNOSIS — M50121 Cervical disc disorder at C4-C5 level with radiculopathy: Secondary | ICD-10-CM | POA: Diagnosis not present

## 2019-07-11 DIAGNOSIS — G5601 Carpal tunnel syndrome, right upper limb: Secondary | ICD-10-CM | POA: Insufficient documentation

## 2019-07-11 HISTORY — PX: CARPAL TUNNEL RELEASE: SHX101

## 2019-07-11 HISTORY — PX: ANTERIOR CERVICAL DECOMP/DISCECTOMY FUSION: SHX1161

## 2019-07-11 LAB — PROTIME-INR
INR: 1 (ref 0.8–1.2)
Prothrombin Time: 12.7 seconds (ref 11.4–15.2)

## 2019-07-11 SURGERY — ANTERIOR CERVICAL DECOMPRESSION/DISCECTOMY FUSION 3 LEVELS
Anesthesia: General | Site: Spine Cervical | Laterality: Right

## 2019-07-11 MED ORDER — POLYVINYL ALCOHOL 1.4 % OP SOLN
1.0000 [drp] | OPHTHALMIC | Status: DC | PRN
  Filled 2019-07-11: qty 15

## 2019-07-11 MED ORDER — LIDOCAINE 2% (20 MG/ML) 5 ML SYRINGE
INTRAMUSCULAR | Status: DC | PRN
  Administered 2019-07-11: 60 mg via INTRAVENOUS

## 2019-07-11 MED ORDER — PANTOPRAZOLE SODIUM 40 MG PO TBEC
40.0000 mg | DELAYED_RELEASE_TABLET | Freq: Every day | ORAL | Status: DC
  Administered 2019-07-12: 40 mg via ORAL
  Filled 2019-07-11: qty 1

## 2019-07-11 MED ORDER — METHOCARBAMOL 1000 MG/10ML IJ SOLN
500.0000 mg | Freq: Four times a day (QID) | INTRAVENOUS | Status: DC | PRN
  Filled 2019-07-11: qty 5

## 2019-07-11 MED ORDER — THROMBIN 5000 UNITS EX SOLR
CUTANEOUS | Status: AC
  Filled 2019-07-11: qty 5000

## 2019-07-11 MED ORDER — FENTANYL CITRATE (PF) 100 MCG/2ML IJ SOLN
INTRAMUSCULAR | Status: AC
  Filled 2019-07-11: qty 2

## 2019-07-11 MED ORDER — ALLOPURINOL 100 MG PO TABS
100.0000 mg | ORAL_TABLET | Freq: Every day | ORAL | Status: DC
  Administered 2019-07-11: 100 mg via ORAL
  Filled 2019-07-11 (×2): qty 1

## 2019-07-11 MED ORDER — DEXAMETHASONE SODIUM PHOSPHATE 10 MG/ML IJ SOLN
INTRAMUSCULAR | Status: DC | PRN
  Administered 2019-07-11: 10 mg via INTRAVENOUS

## 2019-07-11 MED ORDER — BUPIVACAINE HCL (PF) 0.5 % IJ SOLN
INTRAMUSCULAR | Status: DC | PRN
  Administered 2019-07-11: 3 mL

## 2019-07-11 MED ORDER — PHENYLEPHRINE HCL-NACL 10-0.9 MG/250ML-% IV SOLN
INTRAVENOUS | Status: DC | PRN
  Administered 2019-07-11: 25 ug/min via INTRAVENOUS

## 2019-07-11 MED ORDER — LIDOCAINE-EPINEPHRINE 1 %-1:100000 IJ SOLN
INTRAMUSCULAR | Status: DC | PRN
  Administered 2019-07-11: 3 mL

## 2019-07-11 MED ORDER — MUPIROCIN 2 % EX OINT
TOPICAL_OINTMENT | CUTANEOUS | Status: AC
  Administered 2019-07-11: 1 via TOPICAL
  Filled 2019-07-11: qty 22

## 2019-07-11 MED ORDER — MIDAZOLAM HCL 2 MG/2ML IJ SOLN
INTRAMUSCULAR | Status: AC
  Filled 2019-07-11: qty 2

## 2019-07-11 MED ORDER — PANTOPRAZOLE SODIUM 40 MG IV SOLR: 40.0000 mg | Freq: Every day | INTRAVENOUS | Status: DC

## 2019-07-11 MED ORDER — ONDANSETRON HCL 4 MG/2ML IJ SOLN
INTRAMUSCULAR | Status: DC | PRN
  Administered 2019-07-11: 4 mg via INTRAVENOUS

## 2019-07-11 MED ORDER — KCL IN DEXTROSE-NACL 20-5-0.45 MEQ/L-%-% IV SOLN: INTRAVENOUS | Status: DC

## 2019-07-11 MED ORDER — OXYCODONE HCL 5 MG/5ML PO SOLN: 5.0000 mg | Freq: Once | ORAL | Status: AC | PRN

## 2019-07-11 MED ORDER — FENTANYL CITRATE (PF) 250 MCG/5ML IJ SOLN
INTRAMUSCULAR | Status: DC | PRN
  Administered 2019-07-11: 50 ug via INTRAVENOUS
  Administered 2019-07-11: 25 ug via INTRAVENOUS
  Administered 2019-07-11: 50 ug via INTRAVENOUS
  Administered 2019-07-11: 150 ug via INTRAVENOUS

## 2019-07-11 MED ORDER — ORAL CARE MOUTH RINSE: 15.0000 mL | Freq: Once | OROMUCOSAL | Status: AC

## 2019-07-11 MED ORDER — PROMETHAZINE HCL 25 MG/ML IJ SOLN: 6.2500 mg | INTRAMUSCULAR | Status: DC | PRN

## 2019-07-11 MED ORDER — OXYCODONE HCL 5 MG PO TABS
ORAL_TABLET | ORAL | Status: AC
  Filled 2019-07-11: qty 1

## 2019-07-11 MED ORDER — BUPIVACAINE HCL (PF) 0.5 % IJ SOLN
INTRAMUSCULAR | Status: AC
  Filled 2019-07-11: qty 30

## 2019-07-11 MED ORDER — FLEET ENEMA 7-19 GM/118ML RE ENEM: 1.0000 | ENEMA | Freq: Once | RECTAL | Status: DC | PRN

## 2019-07-11 MED ORDER — OXYCODONE HCL 5 MG PO TABS
5.0000 mg | ORAL_TABLET | Freq: Once | ORAL | Status: AC | PRN
  Administered 2019-07-11: 5 mg via ORAL

## 2019-07-11 MED ORDER — LACTATED RINGERS IV SOLN: INTRAVENOUS | Status: DC

## 2019-07-11 MED ORDER — SODIUM CHLORIDE 0.9% FLUSH: 3.0000 mL | INTRAVENOUS | Status: DC | PRN

## 2019-07-11 MED ORDER — DOCUSATE SODIUM 100 MG PO CAPS
100.0000 mg | ORAL_CAPSULE | Freq: Two times a day (BID) | ORAL | Status: DC
  Administered 2019-07-11 – 2019-07-12 (×2): 100 mg via ORAL
  Filled 2019-07-11 (×3): qty 1

## 2019-07-11 MED ORDER — PHENOL 1.4 % MT LIQD: 1.0000 | OROMUCOSAL | Status: DC | PRN

## 2019-07-11 MED ORDER — FENTANYL CITRATE (PF) 250 MCG/5ML IJ SOLN
INTRAMUSCULAR | Status: AC
  Filled 2019-07-11: qty 5

## 2019-07-11 MED ORDER — ROCURONIUM BROMIDE 10 MG/ML (PF) SYRINGE
PREFILLED_SYRINGE | INTRAVENOUS | Status: DC | PRN
  Administered 2019-07-11: 20 mg via INTRAVENOUS
  Administered 2019-07-11: 60 mg via INTRAVENOUS
  Administered 2019-07-11: 20 mg via INTRAVENOUS

## 2019-07-11 MED ORDER — SODIUM CHLORIDE 0.9% FLUSH
3.0000 mL | Freq: Two times a day (BID) | INTRAVENOUS | Status: DC
  Administered 2019-07-11: 3 mL via INTRAVENOUS

## 2019-07-11 MED ORDER — PROPOFOL 10 MG/ML IV BOLUS
INTRAVENOUS | Status: DC | PRN
  Administered 2019-07-11: 160 mg via INTRAVENOUS

## 2019-07-11 MED ORDER — 0.9 % SODIUM CHLORIDE (POUR BTL) OPTIME
TOPICAL | Status: DC | PRN
  Administered 2019-07-11 (×2): 1000 mL

## 2019-07-11 MED ORDER — HYDROCODONE-ACETAMINOPHEN 10-325 MG PO TABS
2.0000 | ORAL_TABLET | ORAL | Status: DC | PRN
  Administered 2019-07-11 (×3): 2 via ORAL
  Filled 2019-07-11 (×3): qty 2

## 2019-07-11 MED ORDER — FENTANYL CITRATE (PF) 100 MCG/2ML IJ SOLN
25.0000 ug | INTRAMUSCULAR | Status: DC | PRN
  Administered 2019-07-11 (×2): 50 ug via INTRAVENOUS

## 2019-07-11 MED ORDER — ALUM & MAG HYDROXIDE-SIMETH 200-200-20 MG/5ML PO SUSP: 30.0000 mL | Freq: Four times a day (QID) | ORAL | Status: DC | PRN

## 2019-07-11 MED ORDER — SIMVASTATIN 20 MG PO TABS
20.0000 mg | ORAL_TABLET | Freq: Every evening | ORAL | Status: DC
  Administered 2019-07-11: 20 mg via ORAL
  Filled 2019-07-11: qty 1

## 2019-07-11 MED ORDER — FLUTICASONE PROPIONATE 50 MCG/ACT NA SUSP
1.0000 | Freq: Every day | NASAL | Status: DC
  Filled 2019-07-11: qty 16

## 2019-07-11 MED ORDER — CEFAZOLIN SODIUM-DEXTROSE 2-4 GM/100ML-% IV SOLN
2.0000 g | INTRAVENOUS | Status: AC
  Administered 2019-07-11: 2 g via INTRAVENOUS
  Filled 2019-07-11: qty 100

## 2019-07-11 MED ORDER — HYDROCODONE-ACETAMINOPHEN 10-325 MG PO TABS: 1.0000 | ORAL_TABLET | ORAL | Status: DC | PRN

## 2019-07-11 MED ORDER — METHOCARBAMOL 500 MG PO TABS
500.0000 mg | ORAL_TABLET | Freq: Four times a day (QID) | ORAL | Status: DC | PRN
  Administered 2019-07-11 – 2019-07-12 (×4): 500 mg via ORAL
  Filled 2019-07-11 (×4): qty 1

## 2019-07-11 MED ORDER — LIDOCAINE HCL (PF) 0.5 % IJ SOLN
INTRAMUSCULAR | Status: AC
  Filled 2019-07-11: qty 50

## 2019-07-11 MED ORDER — OXYCODONE HCL 5 MG PO TABS: 5.0000 mg | ORAL_TABLET | ORAL | Status: DC | PRN

## 2019-07-11 MED ORDER — THROMBIN 5000 UNITS EX SOLR
OROMUCOSAL | Status: DC | PRN
  Administered 2019-07-11: 5 mL

## 2019-07-11 MED ORDER — PROPOFOL 10 MG/ML IV BOLUS
INTRAVENOUS | Status: AC
  Filled 2019-07-11: qty 40

## 2019-07-11 MED ORDER — POLYETHYLENE GLYCOL 3350 17 G PO PACK: 17.0000 g | PACK | Freq: Every day | ORAL | Status: DC | PRN

## 2019-07-11 MED ORDER — ACETAMINOPHEN 325 MG PO TABS
650.0000 mg | ORAL_TABLET | ORAL | Status: DC | PRN
  Administered 2019-07-12: 650 mg via ORAL
  Filled 2019-07-11: qty 2

## 2019-07-11 MED ORDER — ONDANSETRON HCL 4 MG PO TABS: 4.0000 mg | ORAL_TABLET | Freq: Four times a day (QID) | ORAL | Status: DC | PRN

## 2019-07-11 MED ORDER — GABAPENTIN 600 MG PO TABS
600.0000 mg | ORAL_TABLET | Freq: Three times a day (TID) | ORAL | Status: DC
  Administered 2019-07-11 – 2019-07-12 (×3): 600 mg via ORAL
  Filled 2019-07-11 (×3): qty 1

## 2019-07-11 MED ORDER — MUPIROCIN 2 % EX OINT: 1.0000 "application " | TOPICAL_OINTMENT | Freq: Two times a day (BID) | CUTANEOUS | Status: DC

## 2019-07-11 MED ORDER — CEFAZOLIN SODIUM-DEXTROSE 2-4 GM/100ML-% IV SOLN
2.0000 g | Freq: Three times a day (TID) | INTRAVENOUS | Status: AC
  Administered 2019-07-11 (×2): 2 g via INTRAVENOUS
  Filled 2019-07-11 (×2): qty 100

## 2019-07-11 MED ORDER — BISACODYL 10 MG RE SUPP: 10.0000 mg | Freq: Every day | RECTAL | Status: DC | PRN

## 2019-07-11 MED ORDER — PHENYLEPHRINE HCL (PRESSORS) 10 MG/ML IV SOLN
INTRAVENOUS | Status: DC | PRN
  Administered 2019-07-11: 80 ug via INTRAVENOUS
  Administered 2019-07-11: 25 ug via INTRAVENOUS
  Administered 2019-07-11: 80 ug via INTRAVENOUS

## 2019-07-11 MED ORDER — HYDROMORPHONE HCL 1 MG/ML IJ SOLN: 0.5000 mg | INTRAMUSCULAR | Status: DC | PRN

## 2019-07-11 MED ORDER — MENTHOL 3 MG MT LOZG: 1.0000 | LOZENGE | OROMUCOSAL | Status: DC | PRN

## 2019-07-11 MED ORDER — SUGAMMADEX SODIUM 200 MG/2ML IV SOLN
INTRAVENOUS | Status: DC | PRN
  Administered 2019-07-11: 200 mg via INTRAVENOUS

## 2019-07-11 MED ORDER — ACETAMINOPHEN 650 MG RE SUPP: 650.0000 mg | RECTAL | Status: DC | PRN

## 2019-07-11 MED ORDER — SODIUM CHLORIDE 0.9 % IV SOLN: 250.0000 mL | INTRAVENOUS | Status: DC

## 2019-07-11 MED ORDER — MIDAZOLAM HCL 5 MG/5ML IJ SOLN
INTRAMUSCULAR | Status: DC | PRN
  Administered 2019-07-11: 1 mg via INTRAVENOUS

## 2019-07-11 MED ORDER — LIDOCAINE HCL 0.5 % IJ SOLN
INTRAMUSCULAR | Status: DC | PRN
  Administered 2019-07-11: 5 mL

## 2019-07-11 MED ORDER — ZOLPIDEM TARTRATE 5 MG PO TABS: 5.0000 mg | ORAL_TABLET | Freq: Every evening | ORAL | Status: DC | PRN

## 2019-07-11 MED ORDER — ATENOLOL 50 MG PO TABS
50.0000 mg | ORAL_TABLET | Freq: Every day | ORAL | Status: DC
  Administered 2019-07-11: 50 mg via ORAL
  Filled 2019-07-11: qty 1

## 2019-07-11 MED ORDER — LIDOCAINE HCL (PF) 1 % IJ SOLN: INTRAMUSCULAR | Status: DC | PRN

## 2019-07-11 MED ORDER — EPHEDRINE SULFATE 50 MG/ML IJ SOLN
INTRAMUSCULAR | Status: DC | PRN
  Administered 2019-07-11: 5 mg via INTRAVENOUS

## 2019-07-11 MED ORDER — CHLORHEXIDINE GLUCONATE 0.12 % MT SOLN
15.0000 mL | Freq: Once | OROMUCOSAL | Status: AC
  Administered 2019-07-11: 15 mL via OROMUCOSAL
  Filled 2019-07-11: qty 15

## 2019-07-11 MED ORDER — LIDOCAINE-EPINEPHRINE 1 %-1:100000 IJ SOLN
INTRAMUSCULAR | Status: AC
  Filled 2019-07-11: qty 1

## 2019-07-11 MED ORDER — ONDANSETRON HCL 4 MG/2ML IJ SOLN: 4.0000 mg | Freq: Four times a day (QID) | INTRAMUSCULAR | Status: DC | PRN

## 2019-07-11 MED ORDER — LACTATED RINGERS IV SOLN: INTRAVENOUS | Status: DC | PRN

## 2019-07-11 MED ORDER — CHOLESTYRAMINE 4 G PO PACK
4.0000 g | PACK | ORAL | Status: DC
  Administered 2019-07-12: 4 g via ORAL
  Filled 2019-07-11: qty 1

## 2019-07-11 MED ORDER — DIPHENOXYLATE-ATROPINE 2.5-0.025 MG PO TABS
1.0000 | ORAL_TABLET | Freq: Four times a day (QID) | ORAL | Status: DC | PRN
  Filled 2019-07-11: qty 1

## 2019-07-11 SURGICAL SUPPLY — 88 items
ADH SKN CLS APL DERMABOND .7 (GAUZE/BANDAGES/DRESSINGS) ×2
BAND INSRT 18 STRL LF DISP RB (MISCELLANEOUS) ×4
BAND RUBBER #18 3X1/16 STRL (MISCELLANEOUS) ×6 IMPLANT
BASKET BONE COLLECTION (BASKET) ×1 IMPLANT
BIT DRILL 12X2.5XAVTR (BIT) IMPLANT
BIT DRILL AVIATOR 12 (BIT) ×3
BIT DRILL NEURO 2X3.1 SFT TUCH (MISCELLANEOUS) ×2 IMPLANT
BIT DRL 12X2.5XAVTR (BIT) ×2
BLADE SURG 15 STRL LF DISP TIS (BLADE) ×2 IMPLANT
BLADE SURG 15 STRL SS (BLADE) ×3
BLADE ULTRA TIP 2M (BLADE) IMPLANT
BNDG CMPR 75X41 PLY HI ABS (GAUZE/BANDAGES/DRESSINGS) ×2
BNDG GAUZE ELAST 4 BULKY (GAUZE/BANDAGES/DRESSINGS) ×3 IMPLANT
BNDG STRETCH 4X75 STRL LF (GAUZE/BANDAGES/DRESSINGS) ×3 IMPLANT
BUR BARREL STRAIGHT FLUTE 4.0 (BURR) ×4 IMPLANT
CABLE BIPOLOR RESECTION CORD (MISCELLANEOUS) ×3 IMPLANT
CANISTER SUCT 3000ML PPV (MISCELLANEOUS) ×3 IMPLANT
CARTRIDGE OIL MAESTRO DRILL (MISCELLANEOUS) ×2 IMPLANT
CNTNR URN SCR LID CUP LEK RST (MISCELLANEOUS) IMPLANT
CONT SPEC 4OZ STRL OR WHT (MISCELLANEOUS) ×3
COVER MAYO STAND STRL (DRAPES) ×3 IMPLANT
COVER WAND RF STERILE (DRAPES) ×4 IMPLANT
DECANTER SPIKE VIAL GLASS SM (MISCELLANEOUS) ×5 IMPLANT
DERMABOND ADVANCED (GAUZE/BANDAGES/DRESSINGS) ×1
DERMABOND ADVANCED .7 DNX12 (GAUZE/BANDAGES/DRESSINGS) ×2 IMPLANT
DIFFUSER DRILL AIR PNEUMATIC (MISCELLANEOUS) ×3 IMPLANT
DRAIN JACKSON PRATT 10MM FLAT (MISCELLANEOUS) ×1 IMPLANT
DRAPE EXTREMITY T 121X128X90 (DISPOSABLE) ×3 IMPLANT
DRAPE HALF SHEET 40X57 (DRAPES) ×3 IMPLANT
DRAPE LAPAROTOMY 100X72 PEDS (DRAPES) ×3 IMPLANT
DRAPE MICROSCOPE LEICA (MISCELLANEOUS) ×3 IMPLANT
DRILL NEURO 2X3.1 SOFT TOUCH (MISCELLANEOUS) ×3
DRSG EMULSION OIL 3X3 NADH (GAUZE/BANDAGES/DRESSINGS) ×4 IMPLANT
DRSG OPSITE POSTOP 3X4 (GAUZE/BANDAGES/DRESSINGS) ×1 IMPLANT
DRSG OPSITE POSTOP 4X6 (GAUZE/BANDAGES/DRESSINGS) ×1 IMPLANT
DURAPREP 6ML APPLICATOR 50/CS (WOUND CARE) ×3 IMPLANT
ELECT COATED BLADE 2.86 ST (ELECTRODE) ×3 IMPLANT
ELECT REM PT RETURN 9FT ADLT (ELECTROSURGICAL) ×3
ELECTRODE REM PT RTRN 9FT ADLT (ELECTROSURGICAL) ×2 IMPLANT
EVACUATOR SILICONE 100CC (DRAIN) ×1 IMPLANT
GAUZE 4X4 16PLY RFD (DISPOSABLE) ×3 IMPLANT
GAUZE SPONGE 4X4 12PLY STRL (GAUZE/BANDAGES/DRESSINGS) ×3 IMPLANT
GLOVE BIO SURGEON STRL SZ8 (GLOVE) ×7 IMPLANT
GLOVE BIOGEL PI IND STRL 8 (GLOVE) ×2 IMPLANT
GLOVE BIOGEL PI IND STRL 8.5 (GLOVE) ×4 IMPLANT
GLOVE BIOGEL PI INDICATOR 8 (GLOVE) ×1
GLOVE BIOGEL PI INDICATOR 8.5 (GLOVE) ×2
GLOVE ECLIPSE 8.0 STRL XLNG CF (GLOVE) ×3 IMPLANT
GLOVE EXAM NITRILE XL STR (GLOVE) IMPLANT
GLOVE SURG SS PI 6.0 STRL IVOR (GLOVE) ×2 IMPLANT
GOWN STRL REUS W/ TWL LRG LVL3 (GOWN DISPOSABLE) ×2 IMPLANT
GOWN STRL REUS W/ TWL XL LVL3 (GOWN DISPOSABLE) ×2 IMPLANT
GOWN STRL REUS W/TWL 2XL LVL3 (GOWN DISPOSABLE) ×3 IMPLANT
GOWN STRL REUS W/TWL LRG LVL3 (GOWN DISPOSABLE) ×3
GOWN STRL REUS W/TWL XL LVL3 (GOWN DISPOSABLE) ×3
HALTER HD/CHIN CERV TRACTION D (MISCELLANEOUS) ×3 IMPLANT
HEMOSTAT POWDER KIT SURGIFOAM (HEMOSTASIS) ×3 IMPLANT
KIT BASIN OR (CUSTOM PROCEDURE TRAY) ×5 IMPLANT
KIT TURNOVER KIT B (KITS) ×5 IMPLANT
NDL HYPO 25X1 1.5 SAFETY (NEEDLE) ×4 IMPLANT
NDL SPNL 18GX3.5 QUINCKE PK (NEEDLE) IMPLANT
NDL SPNL 22GX3.5 QUINCKE BK (NEEDLE) ×2 IMPLANT
NEEDLE HYPO 25X1 1.5 SAFETY (NEEDLE) ×3 IMPLANT
NEEDLE SPNL 18GX3.5 QUINCKE PK (NEEDLE) IMPLANT
NEEDLE SPNL 22GX3.5 QUINCKE BK (NEEDLE) ×3 IMPLANT
NS IRRIG 1000ML POUR BTL (IV SOLUTION) ×6 IMPLANT
OIL CARTRIDGE MAESTRO DRILL (MISCELLANEOUS) ×3
PACK LAMINECTOMY NEURO (CUSTOM PROCEDURE TRAY) ×3 IMPLANT
PACK SURGICAL SETUP 50X90 (CUSTOM PROCEDURE TRAY) ×3 IMPLANT
PAD ARMBOARD 7.5X6 YLW CONV (MISCELLANEOUS) ×18 IMPLANT
PEEK AVS AS 6X12X4 (Peek) ×1 IMPLANT
PIN DISTRACTION 14MM (PIN) ×7 IMPLANT
PLATE AVIATOR ASSY 3LVL SZ 42 (Plate) ×1 IMPLANT
SCREW AVIATOR VAR SELFTAP 4X12 (Screw) ×8 IMPLANT
SPACER AVS AS 12X14X5X0 (Orthopedic Implant) ×2 IMPLANT
SPONGE INTESTINAL PEANUT (DISPOSABLE) ×4 IMPLANT
SPONGE SURGIFOAM ABS GEL 100 (HEMOSTASIS) IMPLANT
STAPLER SKIN PROX WIDE 3.9 (STAPLE) IMPLANT
STOCKINETTE 4X48 STRL (DRAPES) ×3 IMPLANT
SUT ETHILON 3 0 PS 1 (SUTURE) ×3 IMPLANT
SUT VIC AB 3-0 SH 8-18 (SUTURE) ×3 IMPLANT
SYR BULB EAR ULCER 3OZ GRN STR (SYRINGE) ×3 IMPLANT
SYR CONTROL 10ML LL (SYRINGE) ×3 IMPLANT
TOWEL GREEN STERILE (TOWEL DISPOSABLE) ×6 IMPLANT
TOWEL GREEN STERILE FF (TOWEL DISPOSABLE) ×6 IMPLANT
TRAY FOLEY MTR SLVR 16FR STAT (SET/KITS/TRAYS/PACK) IMPLANT
UNDERPAD 30X36 HEAVY ABSORB (UNDERPADS AND DIAPERS) ×3 IMPLANT
WATER STERILE IRR 1000ML POUR (IV SOLUTION) ×5 IMPLANT

## 2019-07-11 NOTE — Progress Notes (Signed)
Orthopedic Tech Progress Note Patient Details:  [name] 07/29/1955 [id number] Called [employer] and ordered patient brace Patient ID: [name], female   DOB: 06-Sep-1955, 64 y.o.   MRN: [id number]   Tammy Sours 07/11/2019, 12:49 PM

## 2019-07-11 NOTE — Progress Notes (Signed)
Awake, alert, conversant.  Full strength bilateral D/B/T/HI.  Patient is sore, but fully oriented and is otherwise doing well.

## 2019-07-11 NOTE — Progress Notes (Signed)
Subjective: Patient reports mild soreness around her posterior neck but otherwise doing well with no other complaints. She reports no numbness, tingling, or pain in her BUE or BLE. Has been up with therapy and ambulated well.   Objective: Vital signs in last 24 hours: Temp:  [97.4 F (36.3 C)-98.4 F (36.9 C)] 98.2 F (36.8 C) (07/08 1525) Pulse Rate:  [60-86] 86 (07/08 1525) Resp:  [10-20] 18 (07/08 1525) BP: (120-144)/(56-95) 133/74 (07/08 1525) SpO2:  [92 %-98 %] 95 % (07/08 1525) Weight:  [104.3 kg] 104.3 kg (07/08 0549)  Intake/Output from previous day: No intake/output data recorded. Intake/Output this shift: Total I/O In: 1160 [P.O.:60; I.V.:900; IV Piggyback:200] Out: 35 [Drains:10; Blood:25]  Physical Exam: Awake, alert, oriented, and conversant.  Full strength bilateral D/B/T/HI.  JP drain with minimal drainage. Neck dressing and CTR dressings are CDI with no erythema, drainage, or edema.   Lab Results: Recent Labs    07/09/19 1430  WBC 4.2  HGB 12.1  HCT 38.1  PLT 150   BMET Recent Labs    07/09/19 1430  NA 139  K 4.2  CL 104  CO2 26  GLUCOSE 111*  BUN 15  CREATININE 1.04*  CALCIUM 9.2    Studies/Results: DG Cervical Spine 1 View  Result Date: 07/11/2019 CLINICAL DATA:  Intraoperative localization for fusion EXAM: DG CERVICAL SPINE - 1 VIEW COMPARISON:  May 30, 2018 FINDINGS: Cross-table lateral cervical image obtained. Metallic probe tip is anterior to the inferior most aspect of the C3 vertebral body. Note that most of the cervical spine is obscured by shoulder artifact. Visualized upper cervical vertebral bodies appear intact. IMPRESSION: Limited view of cervical spine due to shoulder artifact. Metallic probe tip anterior to the inferior-most aspect of C3. Electronically Signed   By: Lowella Grip III M.D.   On: 07/11/2019 13:03    Assessment/Plan: Plan to remove JP drain in the morning and discharge patient to home. Continue ambulating patient  and work on pain management. Continue hard cervical collar.    LOS: 0 days    Peggyann Shoals, MD 07/11/2019, 4:42 PM

## 2019-07-11 NOTE — Anesthesia Postprocedure Evaluation (Signed)
Anesthesia Post Note  Patient: [name]  Procedure(s) Performed: Cervical Four-Five Cervical Five-Six Cervical Six-Seven Anterior cervical decompression/discectomy/fusion (N/A Spine Cervical) Right Carpal tunnel release (Right Hand)     Patient location during evaluation: PACU Anesthesia Type: General Level of consciousness: awake and alert Pain management: pain level controlled Vital Signs Assessment: post-procedure vital signs reviewed and stable Respiratory status: spontaneous breathing, nonlabored ventilation and respiratory function stable Cardiovascular status: blood pressure returned to baseline and stable Postop Assessment: no apparent nausea or vomiting Anesthetic complications: no   No complications documented.  Last Vitals:  Vitals:   07/11/19 1203 07/11/19 1231  BP: 130/61 (!) 144/76  Pulse: 70 74  Resp: 12 20  Temp: 36.7 C 36.8 C  SpO2: 97% 95%    Last Pain:  Vitals:   07/11/19 1247  TempSrc:   PainSc: [place]

## 2019-07-11 NOTE — Interval H&P Note (Signed)
History and Physical Interval Note:  07/11/2019 7:16 AM  [name]  has presented today for surgery, with the diagnosis of Spinal stenosis, Cervical region.  The various methods of treatment have been discussed with the patient and family. After consideration of risks, benefits and other options for treatment, the patient has consented to  Procedure(s) with comments: Cervical 4-5 Cervical 5-6 Cervical 6-7 Anterior cervical decompression/discectomy/fusion (N/A) Right Carpal tunnel release (Right) - 3c/RM 21 as a surgical intervention.  The patient's history has been reviewed, patient examined, no change in status, stable for surgery.  I have reviewed the patient's chart and labs.  Questions were answered to the patient's satisfaction.     Peggyann Shoals

## 2019-07-11 NOTE — Transfer of Care (Signed)
Immediate Anesthesia Transfer of Care Note  Patient: [name]  Procedure(s) Performed: Cervical Four-Five Cervical Five-Six Cervical Six-Seven Anterior cervical decompression/discectomy/fusion (N/A Spine Cervical) Right Carpal tunnel release (Right Hand)  Patient Location: PACU  Anesthesia Type:General  Level of Consciousness: awake, alert , oriented and patient cooperative  Airway & Oxygen Therapy: Patient Spontanous Breathing and Patient connected to face mask oxygen  Post-op Assessment: Report given to RN and Post -op Vital signs reviewed and stable  Post vital signs: Reviewed and stable  Last Vitals:  Vitals Value Taken Time  BP 120/95 07/11/19 1103  Temp    Pulse 78 07/11/19 1104  Resp 14 07/11/19 1104  SpO2 98 % 07/11/19 1104  Vitals shown include unvalidated device data.  Last Pain:  Vitals:   07/11/19 1103  TempSrc:   PainSc: (P) Asleep      Patients Stated Pain Goal: 3 (25/05/39 7673)  Complications: No complications documented.

## 2019-07-11 NOTE — Op Note (Signed)
07/11/2019  10:54 AM  PATIENT:  [name]  64 y.o. female  PRE-OPERATIVE DIAGNOSIS:  Spinal stenosis, Cervical region  POST-OPERATIVE DIAGNOSIS:  Spinal stenosis, Cervical region  PROCEDURE:  Procedure(s) with comments: Cervical Four-Five Cervical Five-Six Cervical Six-Seven Anterior cervical decompression/discectomy/fusion (N/A) - Cervical Four-Five Cervical Five-Six Cervical Six-Seven Anterior cervical decompression/discectomy/fusion with PEEK cages, autograft, anterior cervical plate Right Carpal tunnel release (Right) - Right Carpal tunnel release   SURGEON:  Surgeon(s) and Role:    Erline Levine, MD - Primary  PHYSICIAN ASSISTANT:   ASSISTANTS: Glenford Peers, NP   Poteat, RN   ANESTHESIA:   general  EBL:  25 mL   BLOOD ADMINISTERED:none  DRAINS: (#10) Jackson-Pratt drain(s) with closed bulb suction in the prevertebral space   LOCAL MEDICATIONS USED:  MARCAINE    and LIDOCAINE   SPECIMEN:  No Specimen  DISPOSITION OF SPECIMEN:  N/A  COUNTS:  YES  TOURNIQUET:  * No tourniquets in log *  PROCEDURE: Patient was brought to operating room and following the smooth and uncomplicated induction of general endotracheal anesthesia her head was placed on a horseshoe head holder she was placed in 5 pounds of Holter traction and her anterior neck was prepped and draped in usual sterile fashion. An incision was made on the left side of midline after infiltrating the skin and subcutaneous tissues with local lidocaine. The platysmal layer was incised and subplatysmal dissection was performed exposing the anterior border sternocleidomastoid muscle. Using blunt dissection the carotid sheath was kept lateral and trachea and esophagus kept medial exposing the anterior cervical spine. A bent spinal needle was placed it was felt to be the C 34 level and this was poorly visualized on intraoperative x-ray.  Longus coli muscles were taken down from the anterior cervical spine using  electrocautery and key elevator and self-retaining retractor was placed exposing the C 45, C 56, and C 67 levels. The interspaces were incised and a thorough discectomy was performed. Distraction pins were placed. Initially the C 67 level was operated. Uncinate spurs and central spondylitic ridges were drilled down with a high-speed drill. The spinal cord dura and both C 7 nerve roots were widely decompressed. Hemostasis was assured. After trial sizing a 5 mm peek interbody cage was selected and packed with local autograft. This was tamped into position and countersunk appropriately. Attention was the paid to the C 56 level, where similar decompression was performed.  Uncinate spurs and central spondylitic ridges were drilled down with a high-speed drill. The spinal cord dura and both C 6 nerve roots were widely decompressed.  Hemostasis was assured. After trial sizing a 5 mm peek interbody cage was selected and packed in a similar fashion. This was tamped into position and countersunk appropriately. Attention was the paid to the C 45 level, where similar decompression was performed.  Uncinate spurs and central spondylitic ridges were drilled down with a high-speed drill. The spinal cord dura and both C 5 nerve roots were widely decompressed.  Hemostasis was assured. After trial sizing a 6 mm peek interbody cage was selected and packed in a similar fashion. This was tamped into position and countersunk appropriately. Distraction weight was removed. A 42 mm Aviator anterior cervical plate was affixed to the cervical spine with 12 mm variable-angle screws 2 at C 4, 2 at C 5, 2 at C 6 and 2 at C7. All screws were well-positioned and locking mechanisms were engaged. A final X ray was not obtained due to the patient's large  body habitus as it was felt that we would be unable to visualize any of the operated levels.  . Soft tissues were inspected and found to be in good repair. A # 10 JP drain was inserted through a  separate stab incision.  The wound was irrigated. The platysma layer was closed with 3-0 Vicryl stitches and the skin was reapproximated with 3-0 Vicryl subcuticular stitches. The wound was dressed with Dermabond and an occlusive dressing. Counts were correct at the end of the case.    Patient was then repositioned with her right arm on the arm board.  Her hand and arm were prepped and draped with betadine scrub and Duraprep and sterile stockinet.  Right wrist was infiltrated with lidocaine and an incision was made over a length of 2 cm in line with the fourth ray.  The flexor retinaculum was incised and the carpal tunnel was decompressed.  Decompression was carried into the volar wrist and into the distal palm.  Hemostasis was assured. The wound was irrigated and closed with 3-0 nylon vertical mattress stitches and dressed with a sterile occlusive dressing with Adaptic,  fluff gauze, Kerlix and cling wrap. Patient was extubated and taken to recovery in stable and satisfactory condition. Counts were correct at the end of the case.  PLAN OF CARE: Admit for overnight observation  PATIENT DISPOSITION:  PACU - hemodynamically stable.   Delay start of Pharmacological VTE agent (>24hrs) due to surgical blood loss or risk of bleeding: yes

## 2019-07-11 NOTE — Evaluation (Signed)
Physical Therapy Evaluation Patient Details Name: [name] MRN: [id number] DOB: Aug 16, 1955 Today's Date: 07/11/2019   History of Present Illness  Pt is 64 yo female admitted with spinal stenosis.  She is s/p ACDF of C4-7 on 07/11/19.  Pt additionally had R carpal tunnel release 07/11/19.  Clinical Impression  Pt admitted for above diagnosis.  Pt able to demonstrate safe gait, transfers, and stairs.  Pt has necessary DME and good support at home.  Pt with good understanding of precautions (has had lumbar sx in past).  Pt with no further PT needs.     Follow Up Recommendations No PT follow up    Equipment Recommendations  None recommended by PT    Recommendations for Other Services       Precautions / Restrictions Precautions Precautions: Cervical Precaution Booklet Issued: Yes (comment) Required Braces or Orthoses: Cervical Brace Cervical Brace: Hard collar;Other (comment) (Ok to remove in bed and walking to bathroom) Restrictions Weight Bearing Restrictions: No      Mobility  Bed Mobility Overal bed mobility: Needs Assistance Bed Mobility: Supine to Sit     Supine to sit: Supervision     General bed mobility comments: cued for log roll technique  Transfers Overall transfer level: Needs assistance Equipment used: None Transfers: Sit to/from Stand Sit to Stand: Supervision         General transfer comment: cues to push up with L hand  Ambulation/Gait Ambulation/Gait assistance: Supervision Gait Distance (Feet): 250 Feet Assistive device: None Gait Pattern/deviations: Step-through pattern;Decreased stride length Gait velocity: decreased   General Gait Details: HHA initially but progressed to supervision  Stairs Stairs: Yes Stairs assistance: Min guard Stair Management: One rail Right;Forwards;Step to pattern Number of Stairs: 12 General stair comments: min guard for safety; pt performed steadily but slowly : advised having guarding from spouse at  home  Wheelchair Mobility    Modified Rankin (Stroke Patients Only)       Balance Overall balance assessment: Needs assistance Sitting-balance support: No upper extremity supported;Feet supported Sitting balance-Leahy Scale: Good     Standing balance support: No upper extremity supported Standing balance-Leahy Scale: Good                               Pertinent Vitals/Pain Pain Assessment: 0-10 Pain Score: 8  Pain Location: neck Pain Descriptors / Indicators: Discomfort;Sore Pain Intervention(s): Limited activity within patient's tolerance;Monitored during session;Repositioned    Home Living Family/patient expects to be discharged to:: Private residence Living Arrangements: Spouse/significant other Available Help at Discharge: Family;Available 24 hours/day Type of Home: House Home Access: Stairs to enter Entrance Stairs-Rails: Right;Left;Can reach both Entrance Stairs-Number of Steps: 3 Home Layout: Two level;Bed/bath upstairs Home Equipment: Walker - 2 wheels;Cane - single point;Shower seat - built in      Prior Function Level of Independence: Independent               Journalist, newspaper        Extremity/Trunk Assessment   Upper Extremity Assessment Upper Extremity Assessment: Defer to OT evaluation    Lower Extremity Assessment Lower Extremity Assessment: Overall WFL for tasks assessed    Cervical / Trunk Assessment Cervical / Trunk Assessment:  (in cervical collar; otherwise normal)  Communication   Communication: No difficulties  Cognition Arousal/Alertness: Awake/alert Behavior During Therapy: WFL for tasks assessed/performed Overall Cognitive Status: Within Functional Limits for tasks assessed  General Comments General comments (skin integrity, edema, etc.): Pt verbalized understanding of neck precautions and use of brace    Exercises     Assessment/Plan    PT Assessment  Patent does not need any further PT services  PT Problem List         PT Treatment Interventions      PT Goals (Current goals can be found in the Care Plan section)  Acute Rehab PT Goals Patient Stated Goal: return home PT Goal Formulation: All assessment and education complete, DC therapy Time For Goal Achievement: 07/11/19 Potential to Achieve Goals: Good    Frequency     Barriers to discharge        Co-evaluation               AM-PAC PT "6 Clicks" Mobility  Outcome Measure Help needed turning from your back to your side while in a flat bed without using bedrails?: None Help needed moving from lying on your back to sitting on the side of a flat bed without using bedrails?: None Help needed moving to and from a bed to a chair (including a wheelchair)?: None Help needed standing up from a chair using your arms (e.g., wheelchair or bedside chair)?: None Help needed to walk in hospital room?: None Help needed climbing 3-5 steps with a railing? : None 6 Click Score: 24    End of Session Equipment Utilized During Treatment: Gait belt Activity Tolerance: Patient tolerated treatment well Patient left: with call bell/phone within reach;in chair Nurse Communication: Mobility status      Time: 0354-6568 PT Time Calculation (min) (ACUTE ONLY): 25 min   Charges:   PT Evaluation $PT Eval Moderate Complexity: 1 Mod          Reshawn Ostlund, PT Acute Rehab Services Pager [phone] [facility] 07/11/2019, 2:01 PM

## 2019-07-11 NOTE — Brief Op Note (Signed)
07/11/2019  10:54 AM  PATIENT:  [name]  64 y.o. female  PRE-OPERATIVE DIAGNOSIS:  Spinal stenosis, Cervical region  POST-OPERATIVE DIAGNOSIS:  Spinal stenosis, Cervical region  PROCEDURE:  Procedure(s) with comments: Cervical Four-Five Cervical Five-Six Cervical Six-Seven Anterior cervical decompression/discectomy/fusion (N/A) - Cervical Four-Five Cervical Five-Six Cervical Six-Seven Anterior cervical decompression/discectomy/fusion with PEEK cages, autograft, anterior cervical plate Right Carpal tunnel release (Right) - Right Carpal tunnel release   SURGEON:  Surgeon(s) and Role:    Erline Levine, MD - Primary  PHYSICIAN ASSISTANT:   ASSISTANTS: Glenford Peers, NP   Poteat, RN   ANESTHESIA:   general  EBL:  25 mL   BLOOD ADMINISTERED:none  DRAINS: (#10) Jackson-Pratt drain(s) with closed bulb suction in the prevertebral space   LOCAL MEDICATIONS USED:  MARCAINE    and LIDOCAINE   SPECIMEN:  No Specimen  DISPOSITION OF SPECIMEN:  N/A  COUNTS:  YES  TOURNIQUET:  * No tourniquets in log *  PROCEDURE: Patient was brought to operating room and following the smooth and uncomplicated induction of general endotracheal anesthesia her head was placed on a horseshoe head holder she was placed in 5 pounds of Holter traction and her anterior neck was prepped and draped in usual sterile fashion. An incision was made on the left side of midline after infiltrating the skin and subcutaneous tissues with local lidocaine. The platysmal layer was incised and subplatysmal dissection was performed exposing the anterior border sternocleidomastoid muscle. Using blunt dissection the carotid sheath was kept lateral and trachea and esophagus kept medial exposing the anterior cervical spine. A bent spinal needle was placed it was felt to be the C 34 level and this was poorly visualized on intraoperative x-ray.  Longus coli muscles were taken down from the anterior cervical spine using  electrocautery and key elevator and self-retaining retractor was placed exposing the C 45, C 56, and C 67 levels. The interspaces were incised and a thorough discectomy was performed. Distraction pins were placed. Initially the C 67 level was operated. Uncinate spurs and central spondylitic ridges were drilled down with a high-speed drill. The spinal cord dura and both C 7 nerve roots were widely decompressed. Hemostasis was assured. After trial sizing a 5 mm peek interbody cage was selected and packed with local autograft. This was tamped into position and countersunk appropriately. Attention was the paid to the C 56 level, where similar decompression was performed.  Uncinate spurs and central spondylitic ridges were drilled down with a high-speed drill. The spinal cord dura and both C 6 nerve roots were widely decompressed.  Hemostasis was assured. After trial sizing a 5 mm peek interbody cage was selected and packed in a similar fashion. This was tamped into position and countersunk appropriately. Attention was the paid to the C 45 level, where similar decompression was performed.  Uncinate spurs and central spondylitic ridges were drilled down with a high-speed drill. The spinal cord dura and both C 5 nerve roots were widely decompressed.  Hemostasis was assured. After trial sizing a 6 mm peek interbody cage was selected and packed in a similar fashion. This was tamped into position and countersunk appropriately. Distraction weight was removed. A 42 mm Aviator anterior cervical plate was affixed to the cervical spine with 12 mm variable-angle screws 2 at C 4, 2 at C 5, 2 at C 6 and 2 at C7. All screws were well-positioned and locking mechanisms were engaged. A final X ray was not obtained due to the patient's large  body habitus as it was felt that we would be unable to visualize any of the operated levels.  . Soft tissues were inspected and found to be in good repair. A # 10 JP drain was inserted through a  separate stab incision.  The wound was irrigated. The platysma layer was closed with 3-0 Vicryl stitches and the skin was reapproximated with 3-0 Vicryl subcuticular stitches. The wound was dressed with Dermabond and an occlusive dressing. Counts were correct at the end of the case.    Patient was then repositioned with her right arm on the arm board.  Her hand and arm were prepped and draped with betadine scrub and Duraprep and sterile stockinet.  Right wrist was infiltrated with lidocaine and an incision was made over a length of 2 cm in line with the fourth ray.  The flexor retinaculum was incised and the carpal tunnel was decompressed.  Decompression was carried into the volar wrist and into the distal palm.  Hemostasis was assured. The wound was irrigated and closed with 3-0 nylon vertical mattress stitches and dressed with a sterile occlusive dressing with Adaptic,  fluff gauze, Kerlix and cling wrap. Patient was extubated and taken to recovery in stable and satisfactory condition. Counts were correct at the end of the case.  PLAN OF CARE: Admit for overnight observation  PATIENT DISPOSITION:  PACU - hemodynamically stable.   Delay start of Pharmacological VTE agent (>24hrs) due to surgical blood loss or risk of bleeding: yes

## 2019-07-11 NOTE — Anesthesia Procedure Notes (Signed)
Procedure Name: Intubation Date/Time: 07/11/2019 7:37 AM Performed by: Glynda Jaeger, CRNA Pre-anesthesia Checklist: Patient identified, Patient being monitored, Timeout performed, Emergency Drugs available and Suction available Patient Re-evaluated:Patient Re-evaluated prior to induction Oxygen Delivery Method: Circle System Utilized Preoxygenation: Pre-oxygenation with 100% oxygen Induction Type: IV induction Ventilation: Mask ventilation without difficulty and Oral airway inserted - appropriate to patient size Laryngoscope Size: 3 and Glidescope Grade View: Grade I Tube type: Oral Tube size: 7.5 mm Number of attempts: 1 Airway Equipment and Method: Stylet Placement Confirmation: ETT inserted through vocal cords under direct vision,  positive ETCO2 and breath sounds checked- equal and bilateral Secured at: 24 cm Tube secured with: Tape Dental Injury: Teeth and Oropharynx as per pre-operative assessment

## 2019-07-12 DIAGNOSIS — M4802 Spinal stenosis, cervical region: Secondary | ICD-10-CM | POA: Diagnosis not present

## 2019-07-12 MED ORDER — HYDROCODONE-ACETAMINOPHEN 10-325 MG PO TABS: 1.0000 | ORAL_TABLET | ORAL | 0 refills | Status: DC | PRN

## 2019-07-12 MED ORDER — OXYCODONE HCL 5 MG PO TABS
10.0000 mg | ORAL_TABLET | ORAL | Status: DC | PRN
  Administered 2019-07-12 (×3): 10 mg via ORAL
  Filled 2019-07-12 (×3): qty 2

## 2019-07-12 MED ORDER — METHOCARBAMOL 500 MG PO TABS: 500.0000 mg | ORAL_TABLET | Freq: Four times a day (QID) | ORAL | 0 refills | Status: DC | PRN

## 2019-07-12 MED ORDER — OXYCODONE HCL 10 MG PO TABS: 10.0000 mg | ORAL_TABLET | ORAL | 0 refills | Status: DC | PRN

## 2019-07-12 NOTE — Evaluation (Signed)
Occupational Therapy Evaluation and Discharge Patient Details Name: [name] MRN: [id number] DOB: January 09, 1955 Today's Date: 07/12/2019    History of Present Illness Pt is 64 yo female admitted with spinal stenosis.  She is s/p ACDF of C4-7 on 07/11/19.  Pt additionally had R carpal tunnel release 07/11/19.   Clinical Impression   Pt is functioning modified independently in ADL and mobility. All education completed with pt verbalizing and/or demonstrating understanding. No further OT needs.    Follow Up Recommendations  No OT follow up    Equipment Recommendations  None recommended by OT    Recommendations for Other Services       Precautions / Restrictions Precautions Precautions: Cervical Precaution Booklet Issued: Yes (comment) Required Braces or Orthoses: Cervical Brace Cervical Brace: Hard collar (off in bed and for showering) Restrictions Weight Bearing Restrictions: No      Mobility Bed Mobility               General bed mobility comments: pt seated at EOB  Transfers Overall transfer level: Modified independent Equipment used: None             General transfer comment: educated in fall prevention given cervical collar    Balance Overall balance assessment: Needs assistance Sitting-balance support: No upper extremity supported;Feet supported Sitting balance-Leahy Scale: Good     Standing balance support: No upper extremity supported Standing balance-Leahy Scale: Good                             ADL either performed or assessed with clinical judgement   ADL Overall ADL's : Modified independent                                       General ADL Comments: Educated pt in cervical precautions related to ADL and IADL. Pt has reliable assistance at home to assist with IADL     Vision Baseline Vision/History: Wears glasses Wears Glasses: At all times Patient Visual Report: No change from baseline        Perception     Praxis      Pertinent Vitals/Pain Pain Assessment: Faces Faces Pain Scale: Hurts even more Pain Location: neck Pain Descriptors / Indicators: Discomfort;Sore Pain Intervention(s): Monitored during session;Repositioned;Ice applied;Patient requesting pain meds-RN notified     Hand Dominance Right   Extremity/Trunk Assessment Upper Extremity Assessment Upper Extremity Assessment: RUE deficits/detail RUE Deficits / Details: s/p carpal tunnel sx, wrapped in gauze RUE: Unable to fully assess due to immobilization RUE Coordination: decreased fine motor   Lower Extremity Assessment Lower Extremity Assessment: Defer to PT evaluation       Communication Communication Communication: No difficulties   Cognition Arousal/Alertness: Awake/alert Behavior During Therapy: WFL for tasks assessed/performed Overall Cognitive Status: Within Functional Limits for tasks assessed                                     General Comments       Exercises     Shoulder Instructions      Home Living Family/patient expects to be discharged to:: Private residence Living Arrangements: Spouse/significant other Available Help at Discharge: Family;Available 24 hours/day Type of Home: House Home Access: Stairs to enter [employer] of Steps: 3 Entrance Stairs-Rails: Right;Left;Can reach both Home  Layout: Two level;Bed/bath upstairs Alternate Level Stairs-Number of Steps: 18 Alternate Level Stairs-Rails: Right Bathroom Shower/Tub: Hospital doctor Toilet: Handicapped height     Home Equipment: Environmental consultant - 2 wheels;Cane - single point;Shower seat - built in          Prior Functioning/Environment Level of Independence: Independent                 OT Problem List:        OT Treatment/Interventions:      OT Goals(Current goals can be found in the care plan section) Acute Rehab OT Goals Patient Stated Goal: return home  OT Frequency:      Barriers to D/C:            Co-evaluation              AM-PAC OT "6 Clicks" Daily Activity     Outcome Measure Help from another person eating meals?: None Help from another person taking care of personal grooming?: None Help from another person toileting, which includes using toliet, bedpan, or urinal?: None Help from another person bathing (including washing, rinsing, drying)?: None Help from another person to put on and taking off regular upper body clothing?: None Help from another person to put on and taking off regular lower body clothing?: None 6 Click Score: 24   End of Session Equipment Utilized During Treatment: Cervical collar Nurse Communication: Patient requests pain meds  Activity Tolerance: Patient tolerated treatment well Patient left: in chair;with call bell/phone within reach  OT Visit Diagnosis: Pain                Time: 3016-0109 OT Time Calculation (min): 24 min Charges:  OT General Charges $OT Visit: 1 Visit OT Evaluation $OT Eval Low Complexity: 1 Low OT Treatments $Self Care/Home Management : 8-22 mins  Nestor Lewandowsky, OTR/L Acute Rehabilitation Services Pager: [phone] Office: [phone]  Malka So 07/12/2019, 9:57 AM

## 2019-07-12 NOTE — Progress Notes (Signed)
Subjective: Patient reports "doing much better this morning." She reports some mild incisional pain at her right wrist and mild pain in her posterior neck and into her BUE. She reported no acute events over night and feels much improved from last night. She stated she has been up multiple times ambulating with staff and feels comfortable with ambulation and ready to discharge this morning.   Objective: Vital signs in last 24 hours: Temp:  [97.4 F (36.3 C)-98.6 F (37 C)] 98 F (36.7 C) (07/09 0724) Pulse Rate:  [59-89] 59 (07/09 0724) Resp:  [10-20] 18 (07/09 0724) BP: (118-144)/(56-95) 118/58 (07/09 0724) SpO2:  [92 %-98 %] 98 % (07/09 0724)  Intake/Output from previous day: 07/08 0701 - 07/09 0700 In: 1500 [P.O.:300; I.V.:900; IV Piggyback:300] Out: 70 [Drains:45; Blood:25] Intake/Output this shift: No intake/output data recorded.  Physical Exam: Patient is sitting at the side of the bed with her hard cervical collar on eating breakfast. Awake, alert, oriented, and conversant. MAEW with good strength bilaterally. Wrist dressing is CDI. Neck incision is well approximated without drainage, erythema, and edema. Neck dressing is CDI. JP drain was removed this morning. JP drainage overnight was about 15 ml.   Lab Results: Recent Labs    07/09/19 1430  WBC 4.2  HGB 12.1  HCT 38.1  PLT 150   BMET Recent Labs    07/09/19 1430  NA 139  K 4.2  CL 104  CO2 26  GLUCOSE 111*  BUN 15  CREATININE 1.04*  CALCIUM 9.2    Studies/Results: DG Cervical Spine 1 View  Result Date: 07/11/2019 CLINICAL DATA:  Intraoperative localization for fusion EXAM: DG CERVICAL SPINE - 1 VIEW COMPARISON:  May 30, 2018 FINDINGS: Cross-table lateral cervical image obtained. Metallic probe tip is anterior to the inferior most aspect of the C3 vertebral body. Note that most of the cervical spine is obscured by shoulder artifact. Visualized upper cervical vertebral bodies appear intact. IMPRESSION: Limited  view of cervical spine due to shoulder artifact. Metallic probe tip anterior to the inferior-most aspect of C3. Electronically Signed   By: Lowella Grip III M.D.   On: 07/11/2019 13:03    Assessment/Plan: Patient is much improved from her presurgical symptoms and recovering well from her surgery. The patient and I feel that she is ready for discharge to home. Continue hard cervical collar.     LOS: 0 days    Peggyann Shoals, MD 07/12/2019, 9:13 AM

## 2019-07-12 NOTE — Discharge Summary (Signed)
Physician Discharge Summary  Patient ID: [name] MRN: [id number] DOB/AGE: May 19, 1955 64 y.o.  Admit date: 07/11/2019 Discharge date: 07/12/2019  Admission Diagnoses: Spinal stenosis, Cervical region  Discharge Diagnoses: Spinal stenosis, Cervical region Active Problems:   Herniated cervical disc without myelopathy   Discharged Condition: good  Hospital Course: Patient had severe spinal stenosis of the cervical region and right CTS. It was elected to take her to surgery and perform a cervical Four-Five Cervical Five-Six Cervical Six-Seven Anterior cervical decompression/discectomy/fusionwith PEEK cages, autograft, anterior cervical plate and a right carpal tunnel release. After sucseful completion of her surgires, she was extubated in the OR and taken to PACU in stable condition. After recovering in PACU, she was taken to [facility] for overnight observation. While on 3C, she ambulated with staff and without difficulty multiple times. This morning, pain is well controlled and patient much improved from yesterday and is ready to be discharged to home.   Consults: None  Significant Diagnostic Studies: radiology: X-ray  Treatments: surgery: Cervical Four-Five Cervical Five-Six Cervical Six-Seven Anterior cervical decompression/discectomy/fusion with PEEK cages, autograft, anterior cervical plate  Right Carpal tunnel release  Discharge Exam: Blood pressure (!) 118/58, pulse (!) 59, temperature 98 F (36.7 C), temperature source Oral, resp. rate 18, height 5\' 2"  (1.575 m), weight 104.3 kg, SpO2 98 %.  Patient is sitting at the side of the bed with her hard cervical collar on eating breakfast.Awake, alert, oriented, and conversant. MAEW with good strength bilaterally.Wrist dressing is CDI. Neck incision iswell approximatedwithout drainage, erythema, and edema.Neck dressing is CDI. JP drain was removed this morning. JP drainage overnight was about 15 ml.  Disposition:  Patient is stable and doing well. She is ready to discharge home.       Allergies as of 07/12/2019      Reactions   Shellfish-derived Products Swelling, Anaphylaxis   Pineapple Swelling, Other (See Comments)   Stomach upset       Discharge Instructions     Remove dressing in 72 hours   Complete by: As directed    Diet - low sodium heart healthy   Complete by: As directed    Increase activity slowly   Complete by: As directed      Allergies as of 07/12/2019      Reactions   Shellfish-derived Products Swelling, Anaphylaxis   Pineapple Swelling, Other (See Comments)   Stomach upset      Medication List    STOP taking these medications   Xarelto 20 MG Tabs tablet Generic drug: rivaroxaban     TAKE these medications   allopurinol 100 MG tablet Commonly known as: ZYLOPRIM Take 100 mg by mouth at bedtime.   atenolol 50 MG tablet Commonly known as: TENORMIN Take 50 mg by mouth at bedtime.   cholestyramine 4 g packet Commonly known as: Questran Take 1 packet (4 g total) by mouth 2 (two) times daily. What changed: when to take this   diphenoxylate-atropine 2.5-0.025 MG tablet Commonly known as: LOMOTIL Take 1 tablet by mouth 4 (four) times daily as needed for diarrhea or loose stools.   fluticasone 50 MCG/ACT nasal spray Commonly known as: FLONASE Place 1 spray into both nostrils at bedtime.   gabapentin 600 MG tablet Commonly known as: NEURONTIN Take 600 mg by mouth 3 (three) times daily.   HYDROcodone-acetaminophen 10-325 MG tablet Commonly known as: NORCO Take 1 tablet by mouth in the morning, at noon, in the evening, and at bedtime. What changed: Another medication with the same  name was added. Make sure you understand how and when to take each.   HYDROcodone-acetaminophen 10-325 MG tablet Commonly known as: NORCO Take 1 tablet by mouth every 4 (four) hours as needed for severe pain ((score 7 to 10)). What changed: You were already taking a medication  with the same name, and this prescription was added. Make sure you understand how and when to take each.   methocarbamol 500 MG tablet Commonly known as: ROBAXIN Take 1 tablet (500 mg total) by mouth every 6 (six) hours as needed for muscle spasms.   NexIUM 24HR 20 MG capsule Generic drug: esomeprazole Take 20 mg by mouth daily before breakfast.   Refresh 1.4-0.6 % ophthalmic solution Generic drug: polyvinyl alcohol-povidone Place 1-2 drops into both eyes as needed (for dry eyes).   simvastatin 20 MG tablet Commonly known as: ZOCOR Take 20 mg by mouth every evening.        Signed: Peggyann Shoals, MD 07/12/2019, 11:58 AM

## 2019-07-12 NOTE — Progress Notes (Signed)
Patient is discharged from room 3C04 at this time. Alert and in stable condition. IV site d/c'd and instructions read to patient and spouse with understanding verbalized and all questions answered. Left unit via wheelchair with all belongings at side. 

## 2019-07-12 NOTE — Discharge Instructions (Signed)
Wound Care Leave incision open to air. You may shower. Do not scrub directly on incision.  Do not put any creams, lotions, or ointments on incision. Activity Walk each and every day, increasing distance each day. No lifting greater than 5 lbs.  Avoid excessive neck motion. No driving for 2 weeks; may ride as a passenger locally. Wear neck brace at all times except when showering.   Diet Resume your normal diet.  Return to Work Will be discussed at you follow up appointment. Call Your Doctor If Any of These Occur Redness, drainage, or swelling at the wound.  Temperature greater than 101 degrees. Severe pain not relieved by pain medication. Increased difficulty swallowing. Incision starts to come apart. Follow Up Appt Call today for appointment in 2-4 weeks [phone]) or for problems.  If you have any hardware placed in your spine, you will need an x-ray before your appointment.   

## 2019-07-14 ENCOUNTER — Encounter (HOSPITAL_COMMUNITY): Payer: Self-pay | Admitting: Neurosurgery

## 2019-07-29 DIAGNOSIS — D6859 Other primary thrombophilia: Secondary | ICD-10-CM | POA: Diagnosis not present

## 2019-09-17 ENCOUNTER — Encounter (HOSPITAL_COMMUNITY): Admission: RE | Payer: Self-pay | Source: Home / Self Care

## 2019-09-17 ENCOUNTER — Ambulatory Visit (HOSPITAL_COMMUNITY): Admission: RE | Admit: 2019-09-17 | Payer: Medicare HMO | Source: Home / Self Care | Admitting: Neurosurgery

## 2019-09-17 SURGERY — CARPAL TUNNEL RELEASE
Anesthesia: Monitor Anesthesia Care | Laterality: Left

## 2019-11-24 ENCOUNTER — Other Ambulatory Visit: Payer: Self-pay | Admitting: Oncology

## 2019-11-24 DIAGNOSIS — D4620 Refractory anemia with excess of blasts, unspecified: Secondary | ICD-10-CM

## 2019-11-25 ENCOUNTER — Telehealth: Payer: Self-pay | Admitting: Oncology

## 2019-11-25 NOTE — Telephone Encounter (Signed)
Per patient, she called Dr Bobby Rumpf on 11/21 and stated he wanted her to come in for some Lab work.  She cannot come today - scheduled her for tomorrow at 10:00 am

## 2019-11-26 ENCOUNTER — Inpatient Hospital Stay: Payer: Medicare HMO

## 2019-12-01 ENCOUNTER — Other Ambulatory Visit: Payer: Self-pay | Admitting: Oncology

## 2019-12-01 DIAGNOSIS — D4620 Refractory anemia with excess of blasts, unspecified: Secondary | ICD-10-CM

## 2019-12-01 NOTE — Progress Notes (Signed)
[facility]  [address] [place],  [place]  [address] [phone]  Clinic Day:  12/02/2019  Referring physician: Zoila Shutter, NP   HISTORY OF PRESENT ILLNESS:  The patient is a 64 y.o. 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  This was proven per cytogenetics done on her bone marrow biopsy in April 2019.  She was taking Revlimid. However, due to severe diarrhea, her Revlimid was discontinued over 1 year ago.  As her hemoglobin has held above 10 for numerous months, the patient has not needed red cell shot therapy or other forms of intervention.  She comes in today to reassess her peripheral counts.  Overall, the patient has been complaining of feeling weaker, which concerns her for progression of her myelodysplasia.     PHYSICAL EXAM:  Blood pressure (!) 167/79, pulse 68, temperature 98.1 F (36.7 C), resp. rate 14, height 5' 3"  (1.6 m), weight 241 lb 14.4 oz (109.7 kg), SpO2 95 %. Wt Readings from Last 3 Encounters:  12/02/19 241 lb 14.4 oz (109.7 kg)  12/02/19 241 lb 14.4 oz (109.7 kg)  07/11/19 230 lb (104.3 kg)   Body mass index is 42.85 kg/m. Performance status (ECOG): 1 - Symptomatic but completely ambulatory Physical Exam Constitutional:      Appearance: Normal appearance. She is not ill-appearing.  HENT:     Mouth/Throat:     Mouth: Mucous membranes are moist.     Pharynx: Oropharynx is clear. No oropharyngeal exudate or posterior oropharyngeal erythema.  Cardiovascular:     Rate and Rhythm: Normal rate and regular rhythm.     Heart sounds: No murmur heard.  No friction rub. No gallop.   Pulmonary:     Effort: Pulmonary effort is normal. No respiratory distress.     Breath sounds: Normal breath sounds. No wheezing, rhonchi or rales.  Abdominal:     General: Bowel sounds are normal. There is no distension.     Palpations: Abdomen is soft. There is no mass.     Tenderness: There is no  abdominal tenderness.  Musculoskeletal:        General: No swelling.     Right lower leg: No edema.     Left lower leg: No edema.  Lymphadenopathy:     Cervical: No cervical adenopathy.     Upper Body:     Right upper body: No supraclavicular or axillary adenopathy.     Left upper body: No supraclavicular or axillary adenopathy.     Lower Body: No right inguinal adenopathy. No left inguinal adenopathy.  Skin:    General: Skin is warm.     Coloration: Skin is not jaundiced.     Findings: No lesion or rash.  Neurological:     General: No focal deficit present.     Mental Status: She is alert and oriented to person, place, and time. Mental status is at baseline.     Cranial Nerves: Cranial nerves are intact.  Psychiatric:        Mood and Affect: Mood normal.        Behavior: Behavior normal.        Thought Content: Thought content normal.     LABS:   CBC Latest Ref Rng & Units 12/02/2019 07/09/2019 03/06/2019  WBC - 4.1 4.2 4.1  Hemoglobin 12.0 - 16.0 12.5 12.1 12.1  Hematocrit 36 - 46 37 38.1 36.0  Platelets 150 - 399 175 150 160.0   CMP  Latest Ref Rng & Units 12/02/2019 07/09/2019 03/06/2019  Glucose 70 - 99 mg/dL - 111(H) 100(H)  BUN 4 - 21 10 15 15   Creatinine 0.5 - 1.1 1.0 1.04(H) 1.10  Sodium 137 - 147 137 139 138  Potassium 3.4 - 5.3 4.0 4.2 4.5  Chloride 99 - 108 103 104 103  CO2 13 - 22 27(A) 26 29  Calcium 8.7 - 10.7 8.7 9.2 9.6  Total Protein 6.0 - 8.3 g/dL - - 7.8  Total Bilirubin 0.2 - 1.2 mg/dL - - 0.4  Alkaline Phos 25 - 125 80 - 76  AST 13 - 35 28 - 21  ALT 7 - 35 26 - 21     ASSESSMENT & PLAN:   Assessment/Plan:  A 64 y.o. 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  I remain pleased as her hemoglobin continues to be normal, as are her other peripheral counts.  There appear to be no hematologic signs of her myelodysplasia getting worse.  As that is the case, I will see her back in 4 months for repeat clinical assessment.    The patient understands all the plans discussed today and is in agreement with them.      Liadan Guizar Macarthur Critchley, MD

## 2019-12-02 ENCOUNTER — Encounter: Payer: Self-pay | Admitting: Oncology

## 2019-12-02 ENCOUNTER — Inpatient Hospital Stay: Payer: Medicare HMO | Attending: Oncology | Admitting: Hematology and Oncology

## 2019-12-02 ENCOUNTER — Telehealth: Payer: Self-pay | Admitting: Oncology

## 2019-12-02 ENCOUNTER — Other Ambulatory Visit: Payer: Self-pay | Admitting: Oncology

## 2019-12-02 ENCOUNTER — Inpatient Hospital Stay (INDEPENDENT_AMBULATORY_CARE_PROVIDER_SITE_OTHER): Payer: Medicare HMO | Admitting: Oncology

## 2019-12-02 ENCOUNTER — Other Ambulatory Visit: Payer: Self-pay

## 2019-12-02 VITALS — BP 167/79 | HR 68 | Temp 98.1°F | Resp 14 | Ht 63.0 in | Wt 241.9 lb

## 2019-12-02 DIAGNOSIS — D4620 Refractory anemia with excess of blasts, unspecified: Secondary | ICD-10-CM | POA: Diagnosis not present

## 2019-12-02 LAB — BASIC METABOLIC PANEL
BUN: 10 (ref 4–21)
CO2: 27 — AB (ref 13–22)
Chloride: 103 (ref 99–108)
Creatinine: 1 (ref 0.5–1.1)
Glucose: 111
Potassium: 4 (ref 3.4–5.3)
Sodium: 137 (ref 137–147)

## 2019-12-02 LAB — CBC AND DIFFERENTIAL
HCT: 37 (ref 36–46)
Hemoglobin: 12.5 (ref 12.0–16.0)
Neutrophils Absolute: 1.8
Platelets: 175 (ref 150–399)
WBC: 4.1

## 2019-12-02 LAB — COMPREHENSIVE METABOLIC PANEL
Albumin: 4.1 (ref 3.5–5.0)
Calcium: 8.7 (ref 8.7–10.7)

## 2019-12-02 LAB — CBC: RBC: 3.61 — AB (ref 3.87–5.11)

## 2019-12-02 LAB — HEPATIC FUNCTION PANEL
ALT: 26 (ref 7–35)
AST: 28 (ref 13–35)
Alkaline Phosphatase: 80 (ref 25–125)
Bilirubin, Total: 0.7

## 2019-12-02 NOTE — Telephone Encounter (Signed)
Per 11/29 LOS, patient scheduled for 3/29/20221 Labs, Follow Up

## 2020-01-08 ENCOUNTER — Telehealth: Payer: Self-pay | Admitting: Oncology

## 2020-01-08 NOTE — Telephone Encounter (Signed)
01/08/20 spoke with patient next appt scheduled

## 2020-03-26 ENCOUNTER — Other Ambulatory Visit: Payer: Self-pay | Admitting: Internal Medicine

## 2020-03-30 NOTE — Progress Notes (Signed)
[facility]  [address] [place],  [place]  [address] [phone]  Clinic Day:  03/31/2020  Referring physician: Zoila Shutter, NP   HISTORY OF PRESENT ILLNESS:  The patient is a 65 y.o. 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  This was proven per cytogenetics done on her bone marrow biopsy in April 2019.  She was taking Revlimid. However, due to severe diarrhea, her Revlimid was discontinued in July 2020.  Fortunately, her hemoglobin has been well above 10 for numerous months, even without the support of red cell shot therapy.  She comes in today to reassess her peripheral counts.  Since her last visit, the patient has been doing well.. She denies having increased fatigue or other findings which concern her for progression of her myelodysplasia.    PHYSICAL EXAM:  Blood pressure (!) 188/84, pulse 65, temperature 98.4 F (36.9 C), resp. rate 16, height _0  (1.6 m), weight 243 lb 9.6 oz (110.5 kg), SpO2 95 %. Wt Readings from Last 3 Encounters:  03/31/20 243 lb 9.6 oz (110.5 kg)  12/02/19 241 lb 14.4 oz (109.7 kg)  12/02/19 241 lb 14.4 oz (109.7 kg)   Body mass index is 43.15 kg/m. Performance status (ECOG): 1 - Symptomatic but completely ambulatory Physical Exam Constitutional:      Appearance: Normal appearance. She is not ill-appearing.  HENT:     Mouth/Throat:     Mouth: Mucous membranes are moist.     Pharynx: Oropharynx is clear. No oropharyngeal exudate or posterior oropharyngeal erythema.  Cardiovascular:     Rate and Rhythm: Normal rate and regular rhythm.     Heart sounds: No murmur heard. No friction rub. No gallop.   Pulmonary:     Effort: Pulmonary effort is normal. No respiratory distress.     Breath sounds: Normal breath sounds. No wheezing, rhonchi or rales.  Chest:  Breasts:     Right: No axillary adenopathy or supraclavicular adenopathy.     Left: No axillary adenopathy or  supraclavicular adenopathy.    Abdominal:     General: Bowel sounds are normal. There is no distension.     Palpations: Abdomen is soft. There is no mass.     Tenderness: There is no abdominal tenderness.  Musculoskeletal:        General: No swelling.     Right lower leg: No edema.     Left lower leg: No edema.  Lymphadenopathy:     Cervical: No cervical adenopathy.     Upper Body:     Right upper body: No supraclavicular or axillary adenopathy.     Left upper body: No supraclavicular or axillary adenopathy.     Lower Body: No right inguinal adenopathy. No left inguinal adenopathy.  Skin:    General: Skin is warm.     Coloration: Skin is not jaundiced.     Findings: No lesion or rash.  Neurological:     General: No focal deficit present.     Mental Status: She is alert and oriented to person, place, and time. Mental status is at baseline.     Cranial Nerves: Cranial nerves are intact.  Psychiatric:        Mood and Affect: Mood normal.        Behavior: Behavior normal.        Thought Content: Thought content normal.     LABS:   CBC Latest Ref Rng & Units 03/31/2020 12/02/2019 07/09/2019  WBC - 5.2 4.1 4.2  Hemoglobin 12.0 - 16.0 13.0 12.5 12.1  Hematocrit 36 - 46 39 37 38.1  Platelets 150 - 399 203 175 150   CMP Latest Ref Rng & Units 03/31/2020 12/02/2019 07/09/2019  Glucose 70 - 99 mg/dL - - 111(H)  BUN 4 - _0 Creatinine 0.5 - 1.1 1.1 1.0 1.04(H)  Sodium 137 - 147 136(A) 137 139  Potassium 3.4 - 5.3 4.1 4.0 4.2  Chloride 99 - 108 101 103 104  CO2 13 - 22 25(A) 27(A) 26  Calcium 8.7 - 10.7 8.4(A) 8.7 9.2  Total Protein 6.0 - 8.3 g/dL - - -  Total Bilirubin 0.2 - 1.2 mg/dL - - -  Alkaline Phos 25 - 125 66 80 -  AST 13 - 35 27 28 -  ALT 7 - 35 23 26 -     ASSESSMENT & PLAN:  Assessment/Plan:  A 65 y.o. 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  I remain pleased as her hemoglobin continues to be normal, as are her other  peripheral counts.  Inf fact, her hemoglobin of 13 is the highest it has been since I have been following her.  There appear to be no hematologic signs of her myelodysplasia getting worse.  As that is the case and she is clinically doing well, I will see her back in 4 months for repeat clinical assessment.   The patient understands all the plans discussed today and is in agreement with them.    Teena Mangus Macarthur Critchley, MD

## 2020-03-31 ENCOUNTER — Inpatient Hospital Stay: Payer: Medicare HMO | Attending: Oncology

## 2020-03-31 ENCOUNTER — Inpatient Hospital Stay: Payer: Medicare HMO | Admitting: Oncology

## 2020-03-31 ENCOUNTER — Telehealth: Payer: Self-pay | Admitting: Oncology

## 2020-03-31 ENCOUNTER — Other Ambulatory Visit: Payer: Self-pay | Admitting: Oncology

## 2020-03-31 ENCOUNTER — Other Ambulatory Visit: Payer: Self-pay | Admitting: Hematology and Oncology

## 2020-03-31 ENCOUNTER — Other Ambulatory Visit: Payer: Self-pay

## 2020-03-31 DIAGNOSIS — D46C Myelodysplastic syndrome with isolated del(5q) chromosomal abnormality: Secondary | ICD-10-CM

## 2020-03-31 DIAGNOSIS — D4620 Refractory anemia with excess of blasts, unspecified: Secondary | ICD-10-CM

## 2020-03-31 LAB — HEPATIC FUNCTION PANEL
ALT: 23 (ref 7–35)
AST: 27 (ref 13–35)
Alkaline Phosphatase: 66 (ref 25–125)
Bilirubin, Total: 1

## 2020-03-31 LAB — BASIC METABOLIC PANEL
BUN: 16 (ref 4–21)
CO2: 25 — AB (ref 13–22)
Chloride: 101 (ref 99–108)
Creatinine: 1.1 (ref 0.5–1.1)
Glucose: 108
Potassium: 4.1 (ref 3.4–5.3)
Sodium: 136 — AB (ref 137–147)

## 2020-03-31 LAB — COMPREHENSIVE METABOLIC PANEL
Albumin: 4.5 (ref 3.5–5.0)
Calcium: 8.4 — AB (ref 8.7–10.7)

## 2020-03-31 LAB — CBC AND DIFFERENTIAL
HCT: 39 (ref 36–46)
Hemoglobin: 13 (ref 12.0–16.0)
Neutrophils Absolute: 1.77
Platelets: 203 (ref 150–399)
WBC: 5.2

## 2020-03-31 LAB — CBC: RBC: 3.83 — AB (ref 3.87–5.11)

## 2020-03-31 NOTE — Telephone Encounter (Signed)
Per 3/29 LOS, patient scheduled for July Appt's.  Gave patient Appt Summary

## 2020-04-07 DIAGNOSIS — D46C Myelodysplastic syndrome with isolated del(5q) chromosomal abnormality: Secondary | ICD-10-CM | POA: Insufficient documentation

## 2020-04-07 DIAGNOSIS — D469 Myelodysplastic syndrome, unspecified: Secondary | ICD-10-CM | POA: Insufficient documentation

## 2020-06-16 ENCOUNTER — Telehealth: Payer: Self-pay

## 2020-06-16 NOTE — Telephone Encounter (Signed)
Pt called to report that she is having increased fatigue. Her Hgb has dropped from 13 to 11.8 per her PCP. She states she is also itching, and she has read that all of those symptoms could be indicative of MDS. "I maybe over reacting, but I would like to see Dr Bobby Rumpf sooner than end of July if possible".  I notified Dr Bobby Rumpf of above. He agreed to move pt's appt up to last week of June. Appt given to pt.

## 2020-06-25 NOTE — Progress Notes (Signed)
[facility]  [address] [place],  [place]  [address] [phone]  Clinic Day:  06/29/2020  Referring physician: Zoila Shutter, NP  This document serves as a record of services personally performed by Marice Potter, MD. It was created on their behalf by [facility], a trained medical scribe. The creation of this record is based on the scribe's personal observations and the provider's statements to them.  HISTORY OF PRESENT ILLNESS:  The patient is a 65 y.o. 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  This was proven per cytogenetics done on her bone marrow biopsy in April 2019.  She was taking Revlimid. However, due to severe diarrhea, her Revlimid was discontinued in July 2020.  Fortunately, her hemoglobin has been well above 10 for numerous months, even without the support of red cell shot therapy.  She comes in today to reassess her peripheral counts.  Since her last visit, the patient has been doing well. She denies having increased fatigue or other findings which concern her for progression of her myelodysplasia.    PHYSICAL EXAM:  Blood pressure (!) 188/93, pulse (!) 57, temperature 98.7 F (37.1 C), resp. rate 16, height 5' 3"  (1.6 m), weight 219 lb 1.6 oz (99.4 kg), SpO2 97 %. Wt Readings from Last 3 Encounters:  06/29/20 219 lb 1.6 oz (99.4 kg)  03/31/20 243 lb 9.6 oz (110.5 kg)  12/02/19 241 lb 14.4 oz (109.7 kg)   Body mass index is 38.81 kg/m. Performance status (ECOG): 1 - Symptomatic but completely ambulatory Physical Exam Constitutional:      Appearance: Normal appearance. She is not ill-appearing.  HENT:     Mouth/Throat:     Mouth: Mucous membranes are moist.     Pharynx: Oropharynx is clear. No oropharyngeal exudate or posterior oropharyngeal erythema.  Cardiovascular:     Rate and Rhythm: Normal rate and regular rhythm.     Heart sounds: No murmur heard.   No friction rub. No gallop.   Pulmonary:     Effort: Pulmonary effort is normal. No respiratory distress.     Breath sounds: Normal breath sounds. No wheezing, rhonchi or rales.  Chest:  Breasts:    Right: No axillary adenopathy or supraclavicular adenopathy.     Left: No axillary adenopathy or supraclavicular adenopathy.  Abdominal:     General: Bowel sounds are normal. There is no distension.     Palpations: Abdomen is soft. There is no mass.     Tenderness: There is no abdominal tenderness.  Musculoskeletal:        General: No swelling.     Right lower leg: No edema.     Left lower leg: No edema.  Lymphadenopathy:     Cervical: No cervical adenopathy.     Upper Body:     Right upper body: No supraclavicular or axillary adenopathy.     Left upper body: No supraclavicular or axillary adenopathy.     Lower Body: No right inguinal adenopathy. No left inguinal adenopathy.  Skin:    General: Skin is warm.     Coloration: Skin is not jaundiced.     Findings: No lesion or rash.  Neurological:     General: No focal deficit present.     Mental Status: She is alert and oriented to person, place, and time. Mental status is at baseline.     Cranial Nerves: Cranial nerves are intact.  Psychiatric:  Mood and Affect: Mood normal.        Behavior: Behavior normal.        Thought Content: Thought content normal.    LABS:   CBC Latest Ref Rng & Units 06/29/2020 03/31/2020 12/02/2019  WBC - 6.7 5.2 4.1  Hemoglobin 12.0 - 16.0 12.7 13.0 12.5  Hematocrit 36 - 46 38 39 37  Platelets 150 - 399 208 203 175   CMP Latest Ref Rng & Units 06/29/2020 03/31/2020 12/02/2019  Glucose 70 - 99 mg/dL - - -  BUN 4 - 21 20 16 10   Creatinine 0.5 - 1.1 0.9 1.1 1.0  Sodium 137 - 147 139 136(A) 137  Potassium 3.4 - 5.3 4.1 4.1 4.0  Chloride 99 - 108 105 101 103  CO2 13 - 22 24(A) 25(A) 27(A)  Calcium 8.7 - 10.7 9.3 8.4(A) 8.7  Total Protein 6.0 - 8.3 g/dL - - -  Total Bilirubin 0.2 - 1.2 mg/dL - - -  Alkaline Phos 25 - 125 72  66 80  AST 13 - 35 23 27 28   ALT 7 - 35 24 23 26    ASSESSMENT & PLAN:  Assessment/Plan:  A 65 y.o. 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  I remain pleased as her hemoglobin continues to be normal, as are her other peripheral counts.  There appear to be no hematologic signs of her myelodysplasia causing any problems.  As that is the case and she is clinically doing well, I will see her back in 4 months for repeat clinical assessment.   The patient understands all the plans discussed today and is in agreement with them.     I, Rita Ohara, am acting as scribe for Marice Potter, MD    I have reviewed this report as typed by the medical scribe, and it is complete and accurate.  Dequincy Macarthur Critchley, MD

## 2020-06-29 ENCOUNTER — Inpatient Hospital Stay: Payer: Medicare HMO

## 2020-06-29 ENCOUNTER — Other Ambulatory Visit: Payer: Self-pay

## 2020-06-29 ENCOUNTER — Telehealth: Payer: Self-pay | Admitting: Oncology

## 2020-06-29 ENCOUNTER — Inpatient Hospital Stay: Payer: Medicare HMO | Attending: Oncology | Admitting: Oncology

## 2020-06-29 ENCOUNTER — Encounter: Payer: Self-pay | Admitting: Oncology

## 2020-06-29 ENCOUNTER — Other Ambulatory Visit: Payer: Self-pay | Admitting: Oncology

## 2020-06-29 VITALS — BP 188/93 | HR 57 | Temp 98.7°F | Resp 16 | Ht 63.0 in | Wt 219.1 lb

## 2020-06-29 DIAGNOSIS — D46C Myelodysplastic syndrome with isolated del(5q) chromosomal abnormality: Secondary | ICD-10-CM

## 2020-06-29 DIAGNOSIS — D4620 Refractory anemia with excess of blasts, unspecified: Secondary | ICD-10-CM

## 2020-06-29 LAB — CBC AND DIFFERENTIAL
HCT: 38 (ref 36–46)
Hemoglobin: 12.7 (ref 12.0–16.0)
Neutrophils Absolute: 4.22
Platelets: 208 (ref 150–399)
WBC: 6.7

## 2020-06-29 LAB — HEPATIC FUNCTION PANEL
ALT: 24 (ref 7–35)
AST: 23 (ref 13–35)
Alkaline Phosphatase: 72 (ref 25–125)
Bilirubin, Total: 0.8

## 2020-06-29 LAB — COMPREHENSIVE METABOLIC PANEL
Albumin: 4.5 (ref 3.5–5.0)
Calcium: 9.3 (ref 8.7–10.7)

## 2020-06-29 LAB — BASIC METABOLIC PANEL
BUN: 20 (ref 4–21)
CO2: 24 — AB (ref 13–22)
Chloride: 105 (ref 99–108)
Creatinine: 0.9 (ref 0.5–1.1)
Glucose: 105
Potassium: 4.1 (ref 3.4–5.3)
Sodium: 139 (ref 137–147)

## 2020-06-29 LAB — CBC: RBC: 3.77 — AB (ref 3.87–5.11)

## 2020-06-29 NOTE — Telephone Encounter (Signed)
Per 6/27 los next appt scheduled and given to patient

## 2020-07-30 ENCOUNTER — Ambulatory Visit: Payer: Medicare HMO | Admitting: Oncology

## 2020-07-30 ENCOUNTER — Other Ambulatory Visit: Payer: Medicare HMO

## 2020-08-27 ENCOUNTER — Other Ambulatory Visit: Payer: Self-pay

## 2020-09-30 ENCOUNTER — Telehealth: Payer: Self-pay | Admitting: Internal Medicine

## 2020-09-30 NOTE — Telephone Encounter (Signed)
Inbound call from patient. Asking if there can be anything else prescribed for cholestyramine. States her pharmacy switched vendors and the packet only comes in [employer] and it makes sick, nausea.

## 2020-10-01 NOTE — Telephone Encounter (Signed)
Please advise 

## 2020-10-01 NOTE — Telephone Encounter (Signed)
Colestid 2 g twice daily (if insurance approves).  Apparently she had insurance issues with this previously.  Otherwise, I have no suggestions regarding powdered alternatives that her insurance company approves.  She can talk to the pharmacist.

## 2020-10-02 MED ORDER — COLESTIPOL HCL 1 G PO TABS: 2.0000 g | ORAL_TABLET | Freq: Two times a day (BID) | ORAL | 6 refills | Status: DC

## 2020-10-02 NOTE — Telephone Encounter (Signed)
Sent Colestid to pharmacy

## 2020-10-23 NOTE — Progress Notes (Incomplete)
[name]  [address] [place],  [place]  [address] [phone]  Clinic Day:  10/29/2020  Referring physician: Zoila Shutter, NP  This document serves as a record of services personally performed by Diana Potter, MD. It was created on their behalf by [facility] E, a trained medical scribe. The creation of this record is based on the scribe's personal observations and the provider's statements to them.  HISTORY OF PRESENT ILLNESS:  The patient is a 64 y.o. 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  This was proven per cytogenetics done on her bone marrow biopsy in April 2019.  She was taking Revlimid. However, due to severe diarrhea, her Revlimid was discontinued in July 2020.  Fortunately, her hemoglobin has been well above 10 for numerous months, even without the support of red cell shot therapy.  She comes in today to reassess her peripheral counts.  Since her last visit, the patient has been doing well. She denies having increased fatigue or other findings which concern her for progression of her myelodysplasia.    PHYSICAL EXAM:  There were no vitals taken for this visit. Wt Readings from Last 3 Encounters:  06/29/20 219 lb 1.6 oz (99.4 kg)  03/31/20 243 lb 9.6 oz (110.5 kg)  12/02/19 241 lb 14.4 oz (109.7 kg)   There is no height or weight on file to calculate BMI. Performance status (ECOG): 1 - Symptomatic but completely ambulatory Physical Exam Constitutional:      Appearance: Normal appearance. She is not ill-appearing.  HENT:     Mouth/Throat:     Mouth: Mucous membranes are moist.     Pharynx: Oropharynx is clear. No oropharyngeal exudate or posterior oropharyngeal erythema.  Cardiovascular:     Rate and Rhythm: Normal rate and regular rhythm.     Heart sounds: No murmur heard.   No friction rub. No gallop.  Pulmonary:     Effort: Pulmonary effort is normal. No respiratory distress.      Breath sounds: Normal breath sounds. No wheezing, rhonchi or rales.  Abdominal:     General: Bowel sounds are normal. There is no distension.     Palpations: Abdomen is soft. There is no mass.     Tenderness: There is no abdominal tenderness.  Musculoskeletal:        General: No swelling.     Right lower leg: No edema.     Left lower leg: No edema.  Lymphadenopathy:     Cervical: No cervical adenopathy.     Upper Body:     Right upper body: No supraclavicular or axillary adenopathy.     Left upper body: No supraclavicular or axillary adenopathy.     Lower Body: No right inguinal adenopathy. No left inguinal adenopathy.  Skin:    General: Skin is warm.     Coloration: Skin is not jaundiced.     Findings: No lesion or rash.  Neurological:     General: No focal deficit present.     Mental Status: She is alert and oriented to person, place, and time. Mental status is at baseline.  Psychiatric:        Mood and Affect: Mood normal.        Behavior: Behavior normal.        Thought Content: Thought content normal.    LABS:   CBC Latest Ref Rng & Units 06/29/2020 03/31/2020 12/02/2019  WBC - 6.7 5.2 4.1  Hemoglobin 12.0 -  16.0 12.7 13.0 12.5  Hematocrit 36 - 46 38 39 37  Platelets 150 - 399 208 203 175   CMP Latest Ref Rng & Units 06/29/2020 03/31/2020 12/02/2019  Glucose 70 - 99 mg/dL - - -  BUN 4 - 21 20 16 10   Creatinine 0.5 - 1.1 0.9 1.1 1.0  Sodium 137 - 147 139 136(A) 137  Potassium 3.4 - 5.3 4.1 4.1 4.0  Chloride 99 - 108 105 101 103  CO2 13 - 22 24(A) 25(A) 27(A)  Calcium 8.7 - 10.7 9.3 8.4(A) 8.7  Total Protein 6.0 - 8.3 g/dL - - -  Total Bilirubin 0.2 - 1.2 mg/dL - - -  Alkaline Phos 25 - 125 72 66 80  AST 13 - 35 23 27 28   ALT 7 - 35 24 23 26    ASSESSMENT & PLAN:  Assessment/Plan:  A 65 y.o. 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  I remain pleased as her hemoglobin continues to be normal, as are her other peripheral counts.   There appear to be no hematologic signs of her myelodysplasia causing any problems.  As that is the case and she is clinically doing well, I will see her back in 4 months for repeat clinical assessment.   The patient understands all the plans discussed today and is in agreement with them.     I, [name], am acting as scribe for Diana Potter, MD    I have reviewed this report as typed by the medical scribe, and it is complete and accurate.  Diana Macarthur Critchley, MD

## 2020-10-29 ENCOUNTER — Other Ambulatory Visit: Payer: Medicare HMO

## 2020-10-29 ENCOUNTER — Ambulatory Visit: Payer: Medicare HMO | Admitting: Oncology

## 2020-11-16 NOTE — Progress Notes (Incomplete)
[facility]  [address] [place],  [place]  [address] [phone]  Clinic Day:  11/23/2020  Referring physician: Zoila Shutter, NP  This document serves as a record of services personally performed by Marice Potter, MD. It was created on their behalf by [facility] E, a trained medical scribe. The creation of this record is based on the scribe's personal observations and the provider's statements to them.  HISTORY OF PRESENT ILLNESS:  The patient is a 65 y.o. 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  This was proven per cytogenetics done on her bone marrow biopsy in April 2019.  She was taking Revlimid. However, due to severe diarrhea, her Revlimid was discontinued in July 2020.  Fortunately, her hemoglobin has been well above 10 for numerous months, even without the support of red cell shot therapy.  She comes in today to reassess her peripheral counts.  Since her last visit, the patient has been doing well. She denies having increased fatigue or other findings which concern her for progression of her myelodysplasia.    PHYSICAL EXAM:  There were no vitals taken for this visit. Wt Readings from Last 3 Encounters:  06/29/20 219 lb 1.6 oz (99.4 kg)  03/31/20 243 lb 9.6 oz (110.5 kg)  12/02/19 241 lb 14.4 oz (109.7 kg)   There is no height or weight on file to calculate BMI. Performance status (ECOG): 1 - Symptomatic but completely ambulatory Physical Exam Constitutional:      Appearance: Normal appearance. She is not ill-appearing.  HENT:     Mouth/Throat:     Mouth: Mucous membranes are moist.     Pharynx: Oropharynx is clear. No oropharyngeal exudate or posterior oropharyngeal erythema.  Cardiovascular:     Rate and Rhythm: Normal rate and regular rhythm.     Heart sounds: No murmur heard.   No friction rub. No gallop.  Pulmonary:     Effort: Pulmonary effort is normal. No respiratory distress.      Breath sounds: Normal breath sounds. No wheezing, rhonchi or rales.  Abdominal:     General: Bowel sounds are normal. There is no distension.     Palpations: Abdomen is soft. There is no mass.     Tenderness: There is no abdominal tenderness.  Musculoskeletal:        General: No swelling.     Right lower leg: No edema.     Left lower leg: No edema.  Lymphadenopathy:     Cervical: No cervical adenopathy.     Upper Body:     Right upper body: No supraclavicular or axillary adenopathy.     Left upper body: No supraclavicular or axillary adenopathy.     Lower Body: No right inguinal adenopathy. No left inguinal adenopathy.  Skin:    General: Skin is warm.     Coloration: Skin is not jaundiced.     Findings: No lesion or rash.  Neurological:     General: No focal deficit present.     Mental Status: She is alert and oriented to person, place, and time. Mental status is at baseline.  Psychiatric:        Mood and Affect: Mood normal.        Behavior: Behavior normal.        Thought Content: Thought content normal.    LABS:   CBC Latest Ref Rng & Units 06/29/2020 03/31/2020 12/02/2019  WBC - 6.7 5.2 4.1  Hemoglobin 12.0 -  16.0 12.7 13.0 12.5  Hematocrit 36 - 46 38 39 37  Platelets 150 - 399 208 203 175   CMP Latest Ref Rng & Units 06/29/2020 03/31/2020 12/02/2019  Glucose 70 - 99 mg/dL - - -  BUN 4 - _0 Creatinine 0.5 - 1.1 0.9 1.1 1.0  Sodium 137 - 147 139 136(A) 137  Potassium 3.4 - 5.3 4.1 4.1 4.0  Chloride 99 - 108 105 101 103  CO2 13 - 22 24(A) 25(A) 27(A)  Calcium 8.7 - 10.7 9.3 8.4(A) 8.7  Total Protein 6.0 - 8.3 g/dL - - -  Total Bilirubin 0.2 - 1.2 mg/dL - - -  Alkaline Phos 25 - 125 72 66 80  AST 13 - 35 _1 ALT 7 - 35 _2 ASSESSMENT & PLAN:  Assessment/Plan:  A 65 y.o. 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  I remain pleased as her hemoglobin continues to be normal, as are her other peripheral counts.   There appear to be no hematologic signs of her myelodysplasia causing any problems.  As that is the case and she is clinically doing well, I will see her back in 4 months for repeat clinical assessment.   The patient understands all the plans discussed today and is in agreement with them.     I, Rita Ohara, am acting as scribe for Marice Potter, MD    I have reviewed this report as typed by the medical scribe, and it is complete and accurate.  Dequincy Macarthur Critchley, MD

## 2020-11-23 ENCOUNTER — Ambulatory Visit: Payer: Medicare HMO | Admitting: Oncology

## 2020-11-23 ENCOUNTER — Other Ambulatory Visit: Payer: Medicare HMO

## 2020-12-09 NOTE — Progress Notes (Incomplete)
[facility]  [address] [place],  [place]  [address] [phone]  Clinic Day:  12/10/2020  Referring physician: Zoila Shutter, NP  This document serves as a record of services personally performed by Marice Potter, MD. It was created on their behalf by [facility] E, a trained medical scribe. The creation of this record is based on the scribe's personal observations and the provider's statements to them.  HISTORY OF PRESENT ILLNESS:  The patient is a 65 y.o. 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  This was proven per cytogenetics done on her bone marrow biopsy in April 2019.  She was taking Revlimid. However, due to severe diarrhea, her Revlimid was discontinued in July 2020.  Fortunately, her hemoglobin has been well above 10 for numerous months, even without the support of red cell shot therapy.  She comes in today to reassess her peripheral counts.  Since her last visit, the patient has been doing well. She denies having increased fatigue or other findings which concern her for progression of her myelodysplasia.    PHYSICAL EXAM:  There were no vitals taken for this visit. Wt Readings from Last 3 Encounters:  06/29/20 219 lb 1.6 oz (99.4 kg)  03/31/20 243 lb 9.6 oz (110.5 kg)  12/02/19 241 lb 14.4 oz (109.7 kg)   There is no height or weight on file to calculate BMI. Performance status (ECOG): 1 - Symptomatic but completely ambulatory Physical Exam Constitutional:      Appearance: Normal appearance. She is not ill-appearing.  HENT:     Mouth/Throat:     Mouth: Mucous membranes are moist.     Pharynx: Oropharynx is clear. No oropharyngeal exudate or posterior oropharyngeal erythema.  Cardiovascular:     Rate and Rhythm: Normal rate and regular rhythm.     Heart sounds: No murmur heard.   No friction rub. No gallop.  Pulmonary:     Effort: Pulmonary effort is normal. No respiratory distress.      Breath sounds: Normal breath sounds. No wheezing, rhonchi or rales.  Abdominal:     General: Bowel sounds are normal. There is no distension.     Palpations: Abdomen is soft. There is no mass.     Tenderness: There is no abdominal tenderness.  Musculoskeletal:        General: No swelling.     Right lower leg: No edema.     Left lower leg: No edema.  Lymphadenopathy:     Cervical: No cervical adenopathy.     Upper Body:     Right upper body: No supraclavicular or axillary adenopathy.     Left upper body: No supraclavicular or axillary adenopathy.     Lower Body: No right inguinal adenopathy. No left inguinal adenopathy.  Skin:    General: Skin is warm.     Coloration: Skin is not jaundiced.     Findings: No lesion or rash.  Neurological:     General: No focal deficit present.     Mental Status: She is alert and oriented to person, place, and time. Mental status is at baseline.  Psychiatric:        Mood and Affect: Mood normal.        Behavior: Behavior normal.        Thought Content: Thought content normal.    LABS:   CBC Latest Ref Rng & Units 06/29/2020 03/31/2020 12/02/2019  WBC - 6.7 5.2 4.1  Hemoglobin 12.0 -  16.0 12.7 13.0 12.5  Hematocrit 36 - 46 38 39 37  Platelets 150 - 399 208 203 175   CMP Latest Ref Rng & Units 06/29/2020 03/31/2020 12/02/2019  Glucose 70 - 99 mg/dL - - -  BUN 4 - _0 Creatinine 0.5 - 1.1 0.9 1.1 1.0  Sodium 137 - 147 139 136(A) 137  Potassium 3.4 - 5.3 4.1 4.1 4.0  Chloride 99 - 108 105 101 103  CO2 13 - 22 24(A) 25(A) 27(A)  Calcium 8.7 - 10.7 9.3 8.4(A) 8.7  Total Protein 6.0 - 8.3 g/dL - - -  Total Bilirubin 0.2 - 1.2 mg/dL - - -  Alkaline Phos 25 - 125 72 66 80  AST 13 - 35 _1 ALT 7 - 35 _2 ASSESSMENT & PLAN:  Assessment/Plan:  A 65 y.o. 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  I remain pleased as her hemoglobin continues to be normal, as are her other peripheral counts.   There appear to be no hematologic signs of her myelodysplasia causing any problems.  As that is the case and she is clinically doing well, I will see her back in 4 months for repeat clinical assessment.   The patient understands all the plans discussed today and is in agreement with them.     I, Rita Ohara, am acting as scribe for Marice Potter, MD    I have reviewed this report as typed by the medical scribe, and it is complete and accurate.  Dequincy Macarthur Critchley, MD

## 2020-12-10 ENCOUNTER — Other Ambulatory Visit: Payer: Medicare HMO

## 2020-12-10 ENCOUNTER — Ambulatory Visit: Payer: Medicare HMO | Admitting: Oncology

## 2020-12-10 ENCOUNTER — Telehealth: Payer: Self-pay | Admitting: Oncology

## 2020-12-10 NOTE — Telephone Encounter (Signed)
Patient rescheduled from 12/8 to 12/16 due to Back Issues

## 2020-12-11 NOTE — Progress Notes (Incomplete)
[facility]  [address] [place],  [place]  [address] [phone]  Clinic Day:  12/18/2020  Referring physician: Zoila Shutter, NP  This document serves as a record of services personally performed by Marice Potter, MD. It was created on their behalf by [facility] E, a trained medical scribe. The creation of this record is based on the scribe's personal observations and the provider's statements to them.  HISTORY OF PRESENT ILLNESS:  The patient is a 65 y.o. 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  This was proven per cytogenetics done on her bone marrow biopsy in April 2019.  She was taking Revlimid. However, due to severe diarrhea, her Revlimid was discontinued in July 2020.  Fortunately, her hemoglobin has been well above 10 for numerous months, even without the support of red cell shot therapy.  She comes in today to reassess her peripheral counts.  Since her last visit, the patient has been doing well. She denies having increased fatigue or other findings which concern her for progression of her myelodysplasia.    PHYSICAL EXAM:  There were no vitals taken for this visit. Wt Readings from Last 3 Encounters:  06/29/20 219 lb 1.6 oz (99.4 kg)  03/31/20 243 lb 9.6 oz (110.5 kg)  12/02/19 241 lb 14.4 oz (109.7 kg)   There is no height or weight on file to calculate BMI. Performance status (ECOG): 1 - Symptomatic but completely ambulatory Physical Exam Constitutional:      Appearance: Normal appearance. She is not ill-appearing.  HENT:     Mouth/Throat:     Mouth: Mucous membranes are moist.     Pharynx: Oropharynx is clear. No oropharyngeal exudate or posterior oropharyngeal erythema.  Cardiovascular:     Rate and Rhythm: Normal rate and regular rhythm.     Heart sounds: No murmur heard.   No friction rub. No gallop.  Pulmonary:     Effort: Pulmonary effort is normal. No respiratory distress.      Breath sounds: Normal breath sounds. No wheezing, rhonchi or rales.  Abdominal:     General: Bowel sounds are normal. There is no distension.     Palpations: Abdomen is soft. There is no mass.     Tenderness: There is no abdominal tenderness.  Musculoskeletal:        General: No swelling.     Right lower leg: No edema.     Left lower leg: No edema.  Lymphadenopathy:     Cervical: No cervical adenopathy.     Upper Body:     Right upper body: No supraclavicular or axillary adenopathy.     Left upper body: No supraclavicular or axillary adenopathy.     Lower Body: No right inguinal adenopathy. No left inguinal adenopathy.  Skin:    General: Skin is warm.     Coloration: Skin is not jaundiced.     Findings: No lesion or rash.  Neurological:     General: No focal deficit present.     Mental Status: She is alert and oriented to person, place, and time. Mental status is at baseline.  Psychiatric:        Mood and Affect: Mood normal.        Behavior: Behavior normal.        Thought Content: Thought content normal.    LABS:   CBC Latest Ref Rng & Units 06/29/2020 03/31/2020 12/02/2019  WBC - 6.7 5.2 4.1  Hemoglobin 12.0 -  16.0 12.7 13.0 12.5  Hematocrit 36 - 46 38 39 37  Platelets 150 - 399 208 203 175   CMP Latest Ref Rng & Units 06/29/2020 03/31/2020 12/02/2019  Glucose 70 - 99 mg/dL - - -  BUN 4 - _0 Creatinine 0.5 - 1.1 0.9 1.1 1.0  Sodium 137 - 147 139 136(A) 137  Potassium 3.4 - 5.3 4.1 4.1 4.0  Chloride 99 - 108 105 101 103  CO2 13 - 22 24(A) 25(A) 27(A)  Calcium 8.7 - 10.7 9.3 8.4(A) 8.7  Total Protein 6.0 - 8.3 g/dL - - -  Total Bilirubin 0.2 - 1.2 mg/dL - - -  Alkaline Phos 25 - 125 72 66 80  AST 13 - 35 _1 ALT 7 - 35 _2 ASSESSMENT & PLAN:  Assessment/Plan:  A 65 y.o. 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  I remain pleased as her hemoglobin continues to be normal, as are her other peripheral counts.   There appear to be no hematologic signs of her myelodysplasia causing any problems.  As that is the case and she is clinically doing well, I will see her back in 4 months for repeat clinical assessment.   The patient understands all the plans discussed today and is in agreement with them.     I, Rita Ohara, am acting as scribe for Marice Potter, MD    I have reviewed this report as typed by the medical scribe, and it is complete and accurate.  Dequincy Macarthur Critchley, MD

## 2020-12-17 ENCOUNTER — Telehealth: Payer: Self-pay | Admitting: Oncology

## 2020-12-17 NOTE — Telephone Encounter (Signed)
Patient Canceled 12/16 Appt's due to being sick.  She will call back to rescheduled

## 2020-12-18 ENCOUNTER — Ambulatory Visit: Payer: Medicare HMO | Admitting: Oncology

## 2020-12-18 ENCOUNTER — Other Ambulatory Visit: Payer: Medicare HMO

## 2021-01-13 NOTE — Progress Notes (Incomplete)
[facility]  [address] [place],  [place]  [address] [phone]  Clinic Day:  01/14/2021  Referring physician: Zoila Shutter, NP  This document serves as a record of services personally performed by Marice Potter, MD. It was created on their behalf by [facility] E, a trained medical scribe. The creation of this record is based on the scribe's personal observations and the provider's statements to them.  HISTORY OF PRESENT ILLNESS:  The patient is a 66 y.o. 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  This was proven per cytogenetics done on her bone marrow biopsy in April 2019.  She was taking Revlimid. However, due to severe diarrhea, her Revlimid was discontinued in July 2020.  Fortunately, her hemoglobin has been well above 10 for numerous months, even without the support of red cell shot therapy.  She comes in today to reassess her peripheral counts.  Since her last visit, the patient has been doing well. She denies having increased fatigue or other findings which concern her for progression of her myelodysplasia.    PHYSICAL EXAM:  There were no vitals taken for this visit. Wt Readings from Last 3 Encounters:  06/29/20 219 lb 1.6 oz (99.4 kg)  03/31/20 243 lb 9.6 oz (110.5 kg)  12/02/19 241 lb 14.4 oz (109.7 kg)   There is no height or weight on file to calculate BMI. Performance status (ECOG): 1 - Symptomatic but completely ambulatory Physical Exam Constitutional:      Appearance: Normal appearance. She is not ill-appearing.  HENT:     Mouth/Throat:     Mouth: Mucous membranes are moist.     Pharynx: Oropharynx is clear. No oropharyngeal exudate or posterior oropharyngeal erythema.  Cardiovascular:     Rate and Rhythm: Normal rate and regular rhythm.     Heart sounds: No murmur heard.   No friction rub. No gallop.  Pulmonary:     Effort: Pulmonary effort is normal. No respiratory distress.      Breath sounds: Normal breath sounds. No wheezing, rhonchi or rales.  Abdominal:     General: Bowel sounds are normal. There is no distension.     Palpations: Abdomen is soft. There is no mass.     Tenderness: There is no abdominal tenderness.  Musculoskeletal:        General: No swelling.     Right lower leg: No edema.     Left lower leg: No edema.  Lymphadenopathy:     Cervical: No cervical adenopathy.     Upper Body:     Right upper body: No supraclavicular or axillary adenopathy.     Left upper body: No supraclavicular or axillary adenopathy.     Lower Body: No right inguinal adenopathy. No left inguinal adenopathy.  Skin:    General: Skin is warm.     Coloration: Skin is not jaundiced.     Findings: No lesion or rash.  Neurological:     General: No focal deficit present.     Mental Status: She is alert and oriented to person, place, and time. Mental status is at baseline.  Psychiatric:        Mood and Affect: Mood normal.        Behavior: Behavior normal.        Thought Content: Thought content normal.    LABS:   CBC Latest Ref Rng & Units 06/29/2020 03/31/2020 12/02/2019  WBC - 6.7 5.2 4.1  Hemoglobin 12.0 -  16.0 12.7 13.0 12.5  Hematocrit 36 - 46 38 39 37  Platelets 150 - 399 208 203 175   CMP Latest Ref Rng & Units 06/29/2020 03/31/2020 12/02/2019  Glucose 70 - 99 mg/dL - - -  BUN 4 - _0 Creatinine 0.5 - 1.1 0.9 1.1 1.0  Sodium 137 - 147 139 136(A) 137  Potassium 3.4 - 5.3 4.1 4.1 4.0  Chloride 99 - 108 105 101 103  CO2 13 - 22 24(A) 25(A) 27(A)  Calcium 8.7 - 10.7 9.3 8.4(A) 8.7  Total Protein 6.0 - 8.3 g/dL - - -  Total Bilirubin 0.2 - 1.2 mg/dL - - -  Alkaline Phos 25 - 125 72 66 80  AST 13 - 35 _1 ALT 7 - 35 _2 ASSESSMENT & PLAN:  Assessment/Plan:  A 66 y.o. 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  I remain pleased as her hemoglobin continues to be normal, as are her other peripheral counts.   There appear to be no hematologic signs of her myelodysplasia causing any problems.  As that is the case and she is clinically doing well, I will see her back in 4 months for repeat clinical assessment.   The patient understands all the plans discussed today and is in agreement with them.     I, Rita Ohara, am acting as scribe for Marice Potter, MD    I have reviewed this report as typed by the medical scribe, and it is complete and accurate.  Dequincy Macarthur Critchley, MD

## 2021-01-14 ENCOUNTER — Ambulatory Visit: Payer: Medicare HMO | Admitting: Oncology

## 2021-01-14 ENCOUNTER — Other Ambulatory Visit: Payer: Medicare HMO

## 2021-01-19 ENCOUNTER — Ambulatory Visit: Payer: Medicare HMO | Admitting: Oncology

## 2021-01-19 ENCOUNTER — Other Ambulatory Visit: Payer: Medicare HMO

## 2021-02-01 NOTE — Progress Notes (Incomplete)
°[facility]  °[address] °[place],  [place]  [address] °[phone] ° °Clinic Day:  06/29/2020 ° °Referring physician: George, Robert E, NP ° °This document serves as a record of services personally performed by Oral Remache A Christ Fullenwider, MD. It was created on their behalf by Curry,Lauren E, a trained medical scribe. The creation of this record is based on the scribe's personal observations and the provider's statements to them. ° °HISTORY OF PRESENT ILLNESS:  °The patient is a 65 y.o. 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  This was proven per cytogenetics done on her bone marrow biopsy in April 2019.  She was taking Revlimid. However, due to severe diarrhea, her Revlimid was discontinued in July 2020.  Fortunately, her hemoglobin has been well above 10 for numerous months, even without the support of red cell shot therapy.  She comes in today to reassess her peripheral counts.  Since her last visit, the patient has been doing well. She denies having increased fatigue or other findings which concern her for progression of her myelodysplasia.   ° °PHYSICAL EXAM:  °There were no vitals taken for this visit. °Wt Readings from Last 3 Encounters:  °06/29/20 219 lb 1.6 oz (99.4 kg)  °03/31/20 243 lb 9.6 oz (110.5 kg)  °12/02/19 241 lb 14.4 oz (109.7 kg)  ° °There is no height or weight on file to calculate BMI. °Performance status (ECOG): 1 - Symptomatic but completely ambulatory °Physical Exam °Constitutional:   °   Appearance: Normal appearance. She is not ill-appearing.  °HENT:  °   Mouth/Throat:  °   Mouth: Mucous membranes are moist.  °   Pharynx: Oropharynx is clear. No oropharyngeal exudate or posterior oropharyngeal erythema.  °Cardiovascular:  °   Rate and Rhythm: Normal rate and regular rhythm.  °   Heart sounds: No murmur heard. °  No friction rub. No gallop.  °Pulmonary:  °   Effort: Pulmonary effort is normal. No respiratory distress.  °    Breath sounds: Normal breath sounds. No wheezing, rhonchi or rales.  °Abdominal:  °   General: Bowel sounds are normal. There is no distension.  °   Palpations: Abdomen is soft. There is no mass.  °   Tenderness: There is no abdominal tenderness.  °Musculoskeletal:     °   General: No swelling.  °   Right lower leg: No edema.  °   Left lower leg: No edema.  °Lymphadenopathy:  °   Cervical: No cervical adenopathy.  °   Upper Body:  °   Right upper body: No supraclavicular or axillary adenopathy.  °   Left upper body: No supraclavicular or axillary adenopathy.  °   Lower Body: No right inguinal adenopathy. No left inguinal adenopathy.  °Skin: °   General: Skin is warm.  °   Coloration: Skin is not jaundiced.  °   Findings: No lesion or rash.  °Neurological:  °   General: No focal deficit present.  °   Mental Status: She is alert and oriented to person, place, and time. Mental status is at baseline.  °Psychiatric:     °   Mood and Affect: Mood normal.     °   Behavior: Behavior normal.     °   Thought Content: Thought content normal.  ° ° °LABS:  ° °CBC Latest Ref Rng & Units 06/29/2020 03/31/2020 12/02/2019  °WBC - 6.7 5.2 4.1  °Hemoglobin 12.0 -   16.0 12.7 13.0 12.5  Hematocrit 36 - 46 38 39 37  Platelets 150 - 399 208 203 175   CMP Latest Ref Rng & Units 06/29/2020 03/31/2020 12/02/2019  Glucose 70 - 99 mg/dL - - -  BUN 4 - _0 Creatinine 0.5 - 1.1 0.9 1.1 1.0  Sodium 137 - 147 139 136(A) 137  Potassium 3.4 - 5.3 4.1 4.1 4.0  Chloride 99 - 108 105 101 103  CO2 13 - 22 24(A) 25(A) 27(A)  Calcium 8.7 - 10.7 9.3 8.4(A) 8.7  Total Protein 6.0 - 8.3 g/dL - - -  Total Bilirubin 0.2 - 1.2 mg/dL - - -  Alkaline Phos 25 - 125 72 66 80  AST 13 - 35 _1 ALT 7 - 35 _2 ASSESSMENT & PLAN:  Assessment/Plan:  A 66 y.o. 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  I remain pleased as her hemoglobin continues to be normal, as are her other peripheral counts.   There appear to be no hematologic signs of her myelodysplasia causing any problems.  As that is the case and she is clinically doing well, I will see her back in 4 months for repeat clinical assessment.   The patient understands all the plans discussed today and is in agreement with them.     I, Rita Ohara, am acting as scribe for Marice Potter, MD    I have reviewed this report as typed by the medical scribe, and it is complete and accurate.  Zora Glendenning Macarthur Critchley, MD

## 2021-02-02 ENCOUNTER — Ambulatory Visit: Payer: Medicare HMO | Admitting: Oncology

## 2021-02-02 ENCOUNTER — Other Ambulatory Visit: Payer: Medicare HMO

## 2021-02-18 NOTE — Progress Notes (Incomplete)
[facility]  [address] [place],  [place]  [address] [phone]  Clinic Day:  06/29/2020  Referring physician: Zoila Shutter, NP  This document serves as a record of services personally performed by Marice Potter, MD. It was created on their behalf by [facility] E, a trained medical scribe. The creation of this record is based on the scribe's personal observations and the provider's statements to them.  HISTORY OF PRESENT ILLNESS:  The patient is a 66 y.o. 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  This was proven per cytogenetics done on her bone marrow biopsy in April 2019.  She was taking Revlimid. However, due to severe diarrhea, her Revlimid was discontinued in July 2020.  Fortunately, her hemoglobin has been well above 10 for numerous months, even without the support of red cell shot therapy.  She comes in today to reassess her peripheral counts.  Since her last visit, the patient has been doing well. She denies having increased fatigue or other findings which concern her for progression of her myelodysplasia.    PHYSICAL EXAM:  There were no vitals taken for this visit. Wt Readings from Last 3 Encounters:  06/29/20 219 lb 1.6 oz (99.4 kg)  03/31/20 243 lb 9.6 oz (110.5 kg)  12/02/19 241 lb 14.4 oz (109.7 kg)   There is no height or weight on file to calculate BMI. Performance status (ECOG): 1 - Symptomatic but completely ambulatory Physical Exam Constitutional:      Appearance: Normal appearance. She is not ill-appearing.  HENT:     Mouth/Throat:     Mouth: Mucous membranes are moist.     Pharynx: Oropharynx is clear. No oropharyngeal exudate or posterior oropharyngeal erythema.  Cardiovascular:     Rate and Rhythm: Normal rate and regular rhythm.     Heart sounds: No murmur heard.   No friction rub. No gallop.  Pulmonary:     Effort: Pulmonary effort is normal. No respiratory distress.      Breath sounds: Normal breath sounds. No wheezing, rhonchi or rales.  Abdominal:     General: Bowel sounds are normal. There is no distension.     Palpations: Abdomen is soft. There is no mass.     Tenderness: There is no abdominal tenderness.  Musculoskeletal:        General: No swelling.     Right lower leg: No edema.     Left lower leg: No edema.  Lymphadenopathy:     Cervical: No cervical adenopathy.     Upper Body:     Right upper body: No supraclavicular or axillary adenopathy.     Left upper body: No supraclavicular or axillary adenopathy.     Lower Body: No right inguinal adenopathy. No left inguinal adenopathy.  Skin:    General: Skin is warm.     Coloration: Skin is not jaundiced.     Findings: No lesion or rash.  Neurological:     General: No focal deficit present.     Mental Status: She is alert and oriented to person, place, and time. Mental status is at baseline.  Psychiatric:        Mood and Affect: Mood normal.        Behavior: Behavior normal.        Thought Content: Thought content normal.    LABS:   CBC Latest Ref Rng & Units 06/29/2020 03/31/2020 12/02/2019  WBC - 6.7 5.2 4.1  Hemoglobin  16.0 12.7 13.0 12.5  Hematocrit 36 - 46 38 39 37  Platelets 150 - 399 208 203 175   CMP Latest Ref Rng & Units 06/29/2020 03/31/2020 12/02/2019  Glucose 70 - 99 mg/dL - - -  BUN 4 - _0 Creatinine 0.5 - 1.1 0.9 1.1 1.0  Sodium 137 - 147 139 136(A) 137  Potassium 3.4 - 5.3 4.1 4.1 4.0  Chloride 99 - 108 105 101 103  CO2 13 - 22 24(A) 25(A) 27(A)  Calcium 8.7 - 10.7 9.3 8.4(A) 8.7  Total Protein 6.0 - 8.3 g/dL - - -  Total Bilirubin 0.2 - 1.2 mg/dL - - -  Alkaline Phos 25 - 125 72 66 80  AST 13 - 35 _1 ALT 7 - 35 _2 ASSESSMENT & PLAN:  Assessment/Plan:  A 66 y.o. 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  I remain pleased as her hemoglobin continues to be normal, as are her other peripheral counts.   There appear to be no hematologic signs of her myelodysplasia causing any problems.  As that is the case and she is clinically doing well, I will see her back in 4 months for repeat clinical assessment.   The patient understands all the plans discussed today and is in agreement with them.     I, Rita Ohara, am acting as scribe for Marice Potter, MD    I have reviewed this report as typed by the medical scribe, and it is complete and accurate.   Macarthur Critchley, MD

## 2021-02-19 ENCOUNTER — Other Ambulatory Visit: Payer: Medicare HMO

## 2021-02-19 ENCOUNTER — Ambulatory Visit: Payer: Medicare HMO | Admitting: Oncology

## 2021-04-13 NOTE — Progress Notes (Incomplete)
?[name]  ?[address] ?[place],  [place]  [address] ?[phone] ? ?Clinic Day:  06/29/2020 ? ?Referring physician: Zoila Shutter, NP ? ?This document serves as a record of services personally performed by Marice Potter, MD. It was created on their behalf by Curry,Lauren E, a trained medical scribe. The creation of this record is based on the scribe's personal observations and the provider's statements to them. ? ?HISTORY OF PRESENT ILLNESS:  ?The patient is a 66 y.o. 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  This was proven per cytogenetics done on her bone marrow biopsy in April 2019.  She was taking Revlimid. However, due to severe diarrhea, her Revlimid was discontinued in July 2020.  Fortunately, her hemoglobin has been well above 10 for numerous months, even without the support of red cell shot therapy.  She comes in today to reassess her peripheral counts.  Since her last visit, the patient has been doing well. She denies having increased fatigue or other findings which concern her for progression of her myelodysplasia.   ? ?PHYSICAL EXAM:  ?There were no vitals taken for this visit. ?Wt Readings from Last 3 Encounters:  ?06/29/20 219 lb 1.6 oz (99.4 kg)  ?03/31/20 243 lb 9.6 oz (110.5 kg)  ?12/02/19 241 lb 14.4 oz (109.7 kg)  ? ?There is no height or weight on file to calculate BMI. ?Performance status (ECOG): 1 - Symptomatic but completely ambulatory ?Physical Exam ?Constitutional:   ?   Appearance: Normal appearance. She is not ill-appearing.  ?HENT:  ?   Mouth/Throat:  ?   Mouth: Mucous membranes are moist.  ?   Pharynx: Oropharynx is clear. No oropharyngeal exudate or posterior oropharyngeal erythema.  ?Cardiovascular:  ?   Rate and Rhythm: Normal rate and regular rhythm.  ?   Heart sounds: No murmur heard. ?  No friction rub. No gallop.  ?Pulmonary:  ?   Effort: Pulmonary effort is normal. No respiratory distress.  ?    Breath sounds: Normal breath sounds. No wheezing, rhonchi or rales.  ?Abdominal:  ?   General: Bowel sounds are normal. There is no distension.  ?   Palpations: Abdomen is soft. There is no mass.  ?   Tenderness: There is no abdominal tenderness.  ?Musculoskeletal:     ?   General: No swelling.  ?   Right lower leg: No edema.  ?   Left lower leg: No edema.  ?Lymphadenopathy:  ?   Cervical: No cervical adenopathy.  ?   Upper Body:  ?   Right upper body: No supraclavicular or axillary adenopathy.  ?   Left upper body: No supraclavicular or axillary adenopathy.  ?   Lower Body: No right inguinal adenopathy. No left inguinal adenopathy.  ?Skin: ?   General: Skin is warm.  ?   Coloration: Skin is not jaundiced.  ?   Findings: No lesion or rash.  ?Neurological:  ?   General: No focal deficit present.  ?   Mental Status: She is alert and oriented to person, place, and time. Mental status is at baseline.  ?Psychiatric:     ?   Mood and Affect: Mood normal.     ?   Behavior: Behavior normal.     ?   Thought Content: Thought content normal.  ? ? ?LABS:  ? ? ?  Latest Ref Rng & Units 06/29/2020  ? 12:00 AM 03/31/2020  ? 12:00 AM  12/02/2019  ? 12:00 AM  ?CBC  ?WBC  6.7   5.2      4.1       ?Hemoglobin 12.0 - 16.0 12.7   13.0      12.5       ?Hematocrit 36 - 46 38   39      37       ?Platelets 150 - 399 208   203      175       ?  ? This result is from an external source.  ? ? ? ?  Latest Ref Rng & Units 06/29/2020  ? 12:00 AM 03/31/2020  ? 12:00 AM 12/02/2019  ? 12:00 AM  ?CMP  ?BUN 4 - 21 20   16      10        ?Creatinine 0.5 - 1.1 0.9   1.1      1.0       ?Sodium 137 - 147 139   136      137       ?Potassium 3.4 - 5.3 4.1   4.1      4.0       ?Chloride 99 - 108 105   101      103       ?CO2 13 - 22 24   25      27        ?Calcium 8.7 - 10.7 9.3   8.4      8.7       ?Alkaline Phos 25 - 125 72   66      80       ?AST 13 - 35 23   27      28        ?ALT 7 - 35 24   23      26        ?  ? This result is from an external source.   ? ? ?ASSESSMENT & PLAN:  ?Assessment/Plan:  A 66 y.o. 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  I remain pleased as her hemoglobin continues to be normal, as are her other peripheral counts.  There appear to be no hematologic signs of her myelodysplasia causing any problems.  As that is the case and she is clinically doing well, I will see her back in 4 months for repeat clinical assessment.   The patient understands all the plans discussed today and is in agreement with them.   ? ? ?I, Rita Ohara, am acting as scribe for Marice Potter, MD   ? ?I have reviewed this report as typed by the medical scribe, and it is complete and accurate. ? ?Diana Lillo Macarthur Critchley, MD ? ? ? ?  ?

## 2021-04-14 ENCOUNTER — Ambulatory Visit: Payer: Medicare HMO | Admitting: Oncology

## 2021-04-14 ENCOUNTER — Other Ambulatory Visit: Payer: Medicare HMO

## 2021-04-21 ENCOUNTER — Ambulatory Visit: Payer: Medicare HMO | Admitting: Oncology

## 2021-04-21 ENCOUNTER — Other Ambulatory Visit: Payer: Medicare HMO

## 2021-04-21 NOTE — Progress Notes (Incomplete)
?[facility]  ?[address] ?[place],  [place]  [address] ?[phone] ? ?Clinic Day:  06/29/2020 ? ?Referring physician: Zoila Shutter, NP ? ?This document serves as a record of services personally performed by Marice Potter, MD. It was created on their behalf by Curry,Lauren E, a trained medical scribe. The creation of this record is based on the scribe's personal observations and the provider's statements to them. ? ?HISTORY OF PRESENT ILLNESS:  ?The patient is a 66 y.o. 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  This was proven per cytogenetics done on her bone marrow biopsy in April 2019.  She was taking Revlimid. However, due to severe diarrhea, her Revlimid was discontinued in July 2020.  Fortunately, her hemoglobin has been well above 10 for numerous months, even without the support of red cell shot therapy.  She comes in today to reassess her peripheral counts.  Since her last visit, the patient has been doing well. She denies having increased fatigue or other findings which concern her for progression of her myelodysplasia.   ? ?PHYSICAL EXAM:  ?There were no vitals taken for this visit. ?Wt Readings from Last 3 Encounters:  ?06/29/20 219 lb 1.6 oz (99.4 kg)  ?03/31/20 243 lb 9.6 oz (110.5 kg)  ?12/02/19 241 lb 14.4 oz (109.7 kg)  ? ?There is no height or weight on file to calculate BMI. ?Performance status (ECOG): 1 - Symptomatic but completely ambulatory ?Physical Exam ?Constitutional:   ?   Appearance: Normal appearance. She is not ill-appearing.  ?HENT:  ?   Mouth/Throat:  ?   Mouth: Mucous membranes are moist.  ?   Pharynx: Oropharynx is clear. No oropharyngeal exudate or posterior oropharyngeal erythema.  ?Cardiovascular:  ?   Rate and Rhythm: Normal rate and regular rhythm.  ?   Heart sounds: No murmur heard. ?  No friction rub. No gallop.  ?Pulmonary:  ?   Effort: Pulmonary effort is normal. No respiratory distress.  ?    Breath sounds: Normal breath sounds. No wheezing, rhonchi or rales.  ?Abdominal:  ?   General: Bowel sounds are normal. There is no distension.  ?   Palpations: Abdomen is soft. There is no mass.  ?   Tenderness: There is no abdominal tenderness.  ?Musculoskeletal:     ?   General: No swelling.  ?   Right lower leg: No edema.  ?   Left lower leg: No edema.  ?Lymphadenopathy:  ?   Cervical: No cervical adenopathy.  ?   Upper Body:  ?   Right upper body: No supraclavicular or axillary adenopathy.  ?   Left upper body: No supraclavicular or axillary adenopathy.  ?   Lower Body: No right inguinal adenopathy. No left inguinal adenopathy.  ?Skin: ?   General: Skin is warm.  ?   Coloration: Skin is not jaundiced.  ?   Findings: No lesion or rash.  ?Neurological:  ?   General: No focal deficit present.  ?   Mental Status: She is alert and oriented to person, place, and time. Mental status is at baseline.  ?Psychiatric:     ?   Mood and Affect: Mood normal.     ?   Behavior: Behavior normal.     ?   Thought Content: Thought content normal.  ? ? ?LABS:  ? ? ?  Latest Ref Rng & Units 06/29/2020  ? 12:00 AM 03/31/2020  ? 12:00 AM  12/02/2019  ? 12:00 AM  ?CBC  ?WBC  6.7   5.2      4.1       ?Hemoglobin 12.0 - 16.0 12.7   13.0      12.5       ?Hematocrit 36 - 46 38   39      37       ?Platelets 150 - 399 208   203      175       ?  ? This result is from an external source.  ? ? ? ?  Latest Ref Rng & Units 06/29/2020  ? 12:00 AM 03/31/2020  ? 12:00 AM 12/02/2019  ? 12:00 AM  ?CMP  ?BUN 4 - 21 20   16      10        ?Creatinine 0.5 - 1.1 0.9   1.1      1.0       ?Sodium 137 - 147 139   136      137       ?Potassium 3.4 - 5.3 4.1   4.1      4.0       ?Chloride 99 - 108 105   101      103       ?CO2 13 - 22 24   25      27        ?Calcium 8.7 - 10.7 9.3   8.4      8.7       ?Alkaline Phos 25 - 125 72   66      80       ?AST 13 - 35 23   27      28        ?ALT 7 - 35 24   23      26        ?  ? This result is from an external source.   ? ? ?ASSESSMENT & PLAN:  ?Assessment/Plan:  A 66 y.o. 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  I remain pleased as her hemoglobin continues to be normal, as are her other peripheral counts.  There appear to be no hematologic signs of her myelodysplasia causing any problems.  As that is the case and she is clinically doing well, I will see her back in 4 months for repeat clinical assessment.   The patient understands all the plans discussed today and is in agreement with them.   ? ? ?I, Rita Ohara, am acting as scribe for Marice Potter, MD   ? ?I have reviewed this report as typed by the medical scribe, and it is complete and accurate. ? ?Mliss Wedin Macarthur Critchley, MD ? ? ? ?  ?

## 2021-04-29 ENCOUNTER — Inpatient Hospital Stay: Payer: Medicare HMO

## 2021-04-29 ENCOUNTER — Inpatient Hospital Stay: Payer: Medicare HMO | Admitting: Oncology

## 2021-04-29 NOTE — Progress Notes (Incomplete)
?[facility]  ?[address] ?[place],  [place]  [address] ?[phone] ? ?Clinic Day:  06/29/2020 ? ?Referring physician: Zoila Shutter, NP ? ?This document serves as a record of services personally performed by Marice Potter, MD. It was created on their behalf by Curry,Lauren E, a trained medical scribe. The creation of this record is based on the scribe's personal observations and the provider's statements to them. ? ?HISTORY OF PRESENT ILLNESS:  ?The patient is a 66 y.o. 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  This was proven per cytogenetics done on her bone marrow biopsy in April 2019.  She was taking Revlimid. However, due to severe diarrhea, her Revlimid was discontinued in July 2020.  Fortunately, her hemoglobin has been well above 10 for numerous months, even without the support of red cell shot therapy.  She comes in today to reassess her peripheral counts.  Since her last visit, the patient has been doing well. She denies having increased fatigue or other findings which concern her for progression of her myelodysplasia.   ? ?PHYSICAL EXAM:  ?There were no vitals taken for this visit. ?Wt Readings from Last 3 Encounters:  ?06/29/20 219 lb 1.6 oz (99.4 kg)  ?03/31/20 243 lb 9.6 oz (110.5 kg)  ?12/02/19 241 lb 14.4 oz (109.7 kg)  ? ?There is no height or weight on file to calculate BMI. ?Performance status (ECOG): 1 - Symptomatic but completely ambulatory ?Physical Exam ?Constitutional:   ?   Appearance: Normal appearance. She is not ill-appearing.  ?HENT:  ?   Mouth/Throat:  ?   Mouth: Mucous membranes are moist.  ?   Pharynx: Oropharynx is clear. No oropharyngeal exudate or posterior oropharyngeal erythema.  ?Cardiovascular:  ?   Rate and Rhythm: Normal rate and regular rhythm.  ?   Heart sounds: No murmur heard. ?  No friction rub. No gallop.  ?Pulmonary:  ?   Effort: Pulmonary effort is normal. No respiratory distress.  ?    Breath sounds: Normal breath sounds. No wheezing, rhonchi or rales.  ?Abdominal:  ?   General: Bowel sounds are normal. There is no distension.  ?   Palpations: Abdomen is soft. There is no mass.  ?   Tenderness: There is no abdominal tenderness.  ?Musculoskeletal:     ?   General: No swelling.  ?   Right lower leg: No edema.  ?   Left lower leg: No edema.  ?Lymphadenopathy:  ?   Cervical: No cervical adenopathy.  ?   Upper Body:  ?   Right upper body: No supraclavicular or axillary adenopathy.  ?   Left upper body: No supraclavicular or axillary adenopathy.  ?   Lower Body: No right inguinal adenopathy. No left inguinal adenopathy.  ?Skin: ?   General: Skin is warm.  ?   Coloration: Skin is not jaundiced.  ?   Findings: No lesion or rash.  ?Neurological:  ?   General: No focal deficit present.  ?   Mental Status: She is alert and oriented to person, place, and time. Mental status is at baseline.  ?Psychiatric:     ?   Mood and Affect: Mood normal.     ?   Behavior: Behavior normal.     ?   Thought Content: Thought content normal.  ? ? ?LABS:  ? ? ?  Latest Ref Rng & Units 06/29/2020  ? 12:00 AM 03/31/2020  ? 12:00 AM  12/02/2019  ? 12:00 AM  ?CBC  ?WBC  6.7   5.2      4.1       ?Hemoglobin 12.0 - 16.0 12.7   13.0      12.5       ?Hematocrit 36 - 46 38   39      37       ?Platelets 150 - 399 208   203      175       ?  ? This result is from an external source.  ? ? ? ?  Latest Ref Rng & Units 06/29/2020  ? 12:00 AM 03/31/2020  ? 12:00 AM 12/02/2019  ? 12:00 AM  ?CMP  ?BUN 4 - 21 20   16      10        ?Creatinine 0.5 - 1.1 0.9   1.1      1.0       ?Sodium 137 - 147 139   136      137       ?Potassium 3.4 - 5.3 4.1   4.1      4.0       ?Chloride 99 - 108 105   101      103       ?CO2 13 - 22 24   25      27        ?Calcium 8.7 - 10.7 9.3   8.4      8.7       ?Alkaline Phos 25 - 125 72   66      80       ?AST 13 - 35 23   27      28        ?ALT 7 - 35 24   23      26        ?  ? This result is from an external source.   ? ? ?ASSESSMENT & PLAN:  ?Assessment/Plan:  A 66 y.o. 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  I remain pleased as her hemoglobin continues to be normal, as are her other peripheral counts.  There appear to be no hematologic signs of her myelodysplasia causing any problems.  As that is the case and she is clinically doing well, I will see her back in 4 months for repeat clinical assessment.   The patient understands all the plans discussed today and is in agreement with them.   ? ? ?I, Rita Ohara, am acting as scribe for Marice Potter, MD   ? ?I have reviewed this report as typed by the medical scribe, and it is complete and accurate. ? ?Artis Beggs Macarthur Critchley, MD ? ? ? ?  ?

## 2021-05-07 ENCOUNTER — Other Ambulatory Visit: Payer: Medicare HMO

## 2021-05-07 ENCOUNTER — Other Ambulatory Visit: Payer: Self-pay | Admitting: Oncology

## 2021-05-07 ENCOUNTER — Ambulatory Visit: Payer: Medicare HMO | Admitting: Oncology

## 2021-05-07 DIAGNOSIS — D46C Myelodysplastic syndrome with isolated del(5q) chromosomal abnormality: Secondary | ICD-10-CM

## 2021-05-07 NOTE — Progress Notes (Incomplete)
?[facility]  ?[address] ?[place],  [place]  [address] ?[phone] ? ?Clinic Day:  06/29/2020 ? ?Referring physician: Zoila Shutter, NP ? ?This document serves as a record of services personally performed by Marice Potter, MD. It was created on their behalf by Curry,Lauren E, a trained medical scribe. The creation of this record is based on the scribe's personal observations and the provider's statements to them. ? ?HISTORY OF PRESENT ILLNESS:  ?The patient is a 66 y.o. 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  This was proven per cytogenetics done on her bone marrow biopsy in April 2019.  She was taking Revlimid. However, due to severe diarrhea, her Revlimid was discontinued in July 2020.  Fortunately, her hemoglobin has been well above 10 for numerous months, even without the support of red cell shot therapy.  She comes in today to reassess her peripheral counts.  Since her last visit, the patient has been doing well. She denies having increased fatigue or other findings which concern her for progression of her myelodysplasia.   ? ?PHYSICAL EXAM:  ?There were no vitals taken for this visit. ?Wt Readings from Last 3 Encounters:  ?06/29/20 219 lb 1.6 oz (99.4 kg)  ?03/31/20 243 lb 9.6 oz (110.5 kg)  ?12/02/19 241 lb 14.4 oz (109.7 kg)  ? ?There is no height or weight on file to calculate BMI. ?Performance status (ECOG): 1 - Symptomatic but completely ambulatory ?Physical Exam ?Constitutional:   ?   Appearance: Normal appearance. She is not ill-appearing.  ?HENT:  ?   Mouth/Throat:  ?   Mouth: Mucous membranes are moist.  ?   Pharynx: Oropharynx is clear. No oropharyngeal exudate or posterior oropharyngeal erythema.  ?Cardiovascular:  ?   Rate and Rhythm: Normal rate and regular rhythm.  ?   Heart sounds: No murmur heard. ?  No friction rub. No gallop.  ?Pulmonary:  ?   Effort: Pulmonary effort is normal. No respiratory distress.  ?    Breath sounds: Normal breath sounds. No wheezing, rhonchi or rales.  ?Abdominal:  ?   General: Bowel sounds are normal. There is no distension.  ?   Palpations: Abdomen is soft. There is no mass.  ?   Tenderness: There is no abdominal tenderness.  ?Musculoskeletal:     ?   General: No swelling.  ?   Right lower leg: No edema.  ?   Left lower leg: No edema.  ?Lymphadenopathy:  ?   Cervical: No cervical adenopathy.  ?   Upper Body:  ?   Right upper body: No supraclavicular or axillary adenopathy.  ?   Left upper body: No supraclavicular or axillary adenopathy.  ?   Lower Body: No right inguinal adenopathy. No left inguinal adenopathy.  ?Skin: ?   General: Skin is warm.  ?   Coloration: Skin is not jaundiced.  ?   Findings: No lesion or rash.  ?Neurological:  ?   General: No focal deficit present.  ?   Mental Status: She is alert and oriented to person, place, and time. Mental status is at baseline.  ?Psychiatric:     ?   Mood and Affect: Mood normal.     ?   Behavior: Behavior normal.     ?   Thought Content: Thought content normal.  ? ? ?LABS:  ? ? ?  Latest Ref Rng & Units 06/29/2020  ? 12:00 AM 03/31/2020  ? 12:00 AM  12/02/2019  ? 12:00 AM  ?CBC  ?WBC  6.7   5.2      4.1       ?Hemoglobin 12.0 - 16.0 12.7   13.0      12.5       ?Hematocrit 36 - 46 38   39      37       ?Platelets 150 - 399 208   203      175       ?  ? This result is from an external source.  ? ? ? ?  Latest Ref Rng & Units 06/29/2020  ? 12:00 AM 03/31/2020  ? 12:00 AM 12/02/2019  ? 12:00 AM  ?CMP  ?BUN 4 - 21 20   16      10        ?Creatinine 0.5 - 1.1 0.9   1.1      1.0       ?Sodium 137 - 147 139   136      137       ?Potassium 3.4 - 5.3 4.1   4.1      4.0       ?Chloride 99 - 108 105   101      103       ?CO2 13 - 22 24   25      27        ?Calcium 8.7 - 10.7 9.3   8.4      8.7       ?Alkaline Phos 25 - 125 72   66      80       ?AST 13 - 35 23   27      28        ?ALT 7 - 35 24   23      26        ?  ? This result is from an external source.   ? ? ?ASSESSMENT & PLAN:  ?Assessment/Plan:  A 66 y.o. 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  I remain pleased as her hemoglobin continues to be normal, as are her other peripheral counts.  There appear to be no hematologic signs of her myelodysplasia causing any problems.  As that is the case and she is clinically doing well, I will see her back in 4 months for repeat clinical assessment.   The patient understands all the plans discussed today and is in agreement with them.   ? ? ?I, Rita Ohara, am acting as scribe for Marice Potter, MD   ? ?I have reviewed this report as typed by the medical scribe, and it is complete and accurate. ? ?Vence Lalor Macarthur Critchley, MD ? ? ? ?  ?

## 2021-05-14 ENCOUNTER — Other Ambulatory Visit: Payer: Self-pay

## 2021-05-17 ENCOUNTER — Other Ambulatory Visit: Payer: Medicare HMO

## 2021-05-17 ENCOUNTER — Ambulatory Visit: Payer: Medicare HMO | Admitting: Hematology and Oncology

## 2021-05-20 ENCOUNTER — Other Ambulatory Visit: Payer: Self-pay

## 2021-05-21 ENCOUNTER — Inpatient Hospital Stay: Payer: Medicare HMO

## 2021-05-21 ENCOUNTER — Ambulatory Visit: Payer: Medicare HMO | Admitting: Hematology and Oncology

## 2021-06-10 NOTE — Progress Notes (Incomplete)
[facility]  [address] [place],  [place]  [address] [phone]  Clinic Day:  06/29/2020  Referring physician: Zoila Shutter, NP  HISTORY OF PRESENT ILLNESS:  The patient is a 66 y.o. 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  This was proven per cytogenetics done on her bone marrow biopsy in April 2019.  She was taking Revlimid. However, due to severe diarrhea, her Revlimid was discontinued in July 2020.  Fortunately, her hemoglobin has been well above 10 for numerous months, even without the support of red cell shot therapy.  She comes in today to reassess her peripheral counts.  Since her last visit, the patient has been doing well. She denies having increased fatigue or other findings which concern her for progression of her myelodysplasia.    PHYSICAL EXAM:  There were no vitals taken for this visit. Wt Readings from Last 3 Encounters:  06/29/20 219 lb 1.6 oz (99.4 kg)  03/31/20 243 lb 9.6 oz (110.5 kg)  12/02/19 241 lb 14.4 oz (109.7 kg)   There is no height or weight on file to calculate BMI. Performance status (ECOG): 1 - Symptomatic but completely ambulatory Physical Exam Constitutional:      Appearance: Normal appearance. She is not ill-appearing.  HENT:     Mouth/Throat:     Mouth: Mucous membranes are moist.     Pharynx: Oropharynx is clear. No oropharyngeal exudate or posterior oropharyngeal erythema.  Cardiovascular:     Rate and Rhythm: Normal rate and regular rhythm.     Heart sounds: No murmur heard.    No friction rub. No gallop.  Pulmonary:     Effort: Pulmonary effort is normal. No respiratory distress.     Breath sounds: Normal breath sounds. No wheezing, rhonchi or rales.  Abdominal:     General: Bowel sounds are normal. There is no distension.     Palpations: Abdomen is soft. There is no mass.     Tenderness: There is no abdominal tenderness.  Musculoskeletal:        General:  No swelling.     Right lower leg: No edema.     Left lower leg: No edema.  Lymphadenopathy:     Cervical: No cervical adenopathy.     Upper Body:     Right upper body: No supraclavicular or axillary adenopathy.     Left upper body: No supraclavicular or axillary adenopathy.     Lower Body: No right inguinal adenopathy. No left inguinal adenopathy.  Skin:    General: Skin is warm.     Coloration: Skin is not jaundiced.     Findings: No lesion or rash.  Neurological:     General: No focal deficit present.     Mental Status: She is alert and oriented to person, place, and time. Mental status is at baseline.  Psychiatric:        Mood and Affect: Mood normal.        Behavior: Behavior normal.        Thought Content: Thought content normal.    LABS:      Latest Ref Rng & Units 06/29/2020   12:00 AM 03/31/2020   12:00 AM 12/02/2019   12:00 AM  CBC  WBC  6.7  5.2     4.1      Hemoglobin 12.0 - 16.0 12.7  13.0     12.5      Hematocrit 36 - 46 38  39     37      Platelets 150 - 399 208  203     175         This result is from an external source.       Latest Ref Rng & Units 06/29/2020   12:00 AM 03/31/2020   12:00 AM 12/02/2019   12:00 AM  CMP  BUN 4 - 21 20  16     10       Creatinine 0.5 - 1.1 0.9  1.1     1.0      Sodium 137 - 147 139  136     137      Potassium 3.4 - 5.3 4.1  4.1     4.0      Chloride 99 - 108 105  101     103      CO2 13 - 22 24  25     27       Calcium 8.7 - 10.7 9.3  8.4     8.7      Alkaline Phos 25 - 125 72  66     80      AST 13 - 35 23  27     28       ALT 7 - 35 24  23     26          This result is from an external source.    ASSESSMENT & PLAN:  Assessment/Plan:  A 66 y.o. 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  I remain pleased as her hemoglobin continues to be normal, as are her other peripheral counts.  There appear to be no hematologic signs of her myelodysplasia causing any problems.  As that is the case  and she is clinically doing well, I will see her back in 4 months for repeat clinical assessment.   The patient understands all the plans discussed today and is in agreement with them.     I, Rita Ohara, am acting as scribe for Marice Potter, MD    I have reviewed this report as typed by the medical scribe, and it is complete and accurate.  Arland Usery Macarthur Critchley, MD

## 2021-06-11 ENCOUNTER — Ambulatory Visit: Payer: Medicare HMO | Admitting: Hematology and Oncology

## 2021-06-11 ENCOUNTER — Other Ambulatory Visit: Payer: Medicare HMO

## 2021-06-11 ENCOUNTER — Ambulatory Visit: Payer: Medicare HMO | Admitting: Oncology

## 2021-07-07 IMAGING — CR DG CERVICAL SPINE 1V
1 series · 1 of 1 positions shown · non-contrast
Comparison: May 30, 2018

CLINICAL DATA: Intraoperative localization for fusion

EXAM:
DG CERVICAL SPINE - 1 VIEW

[lateral]
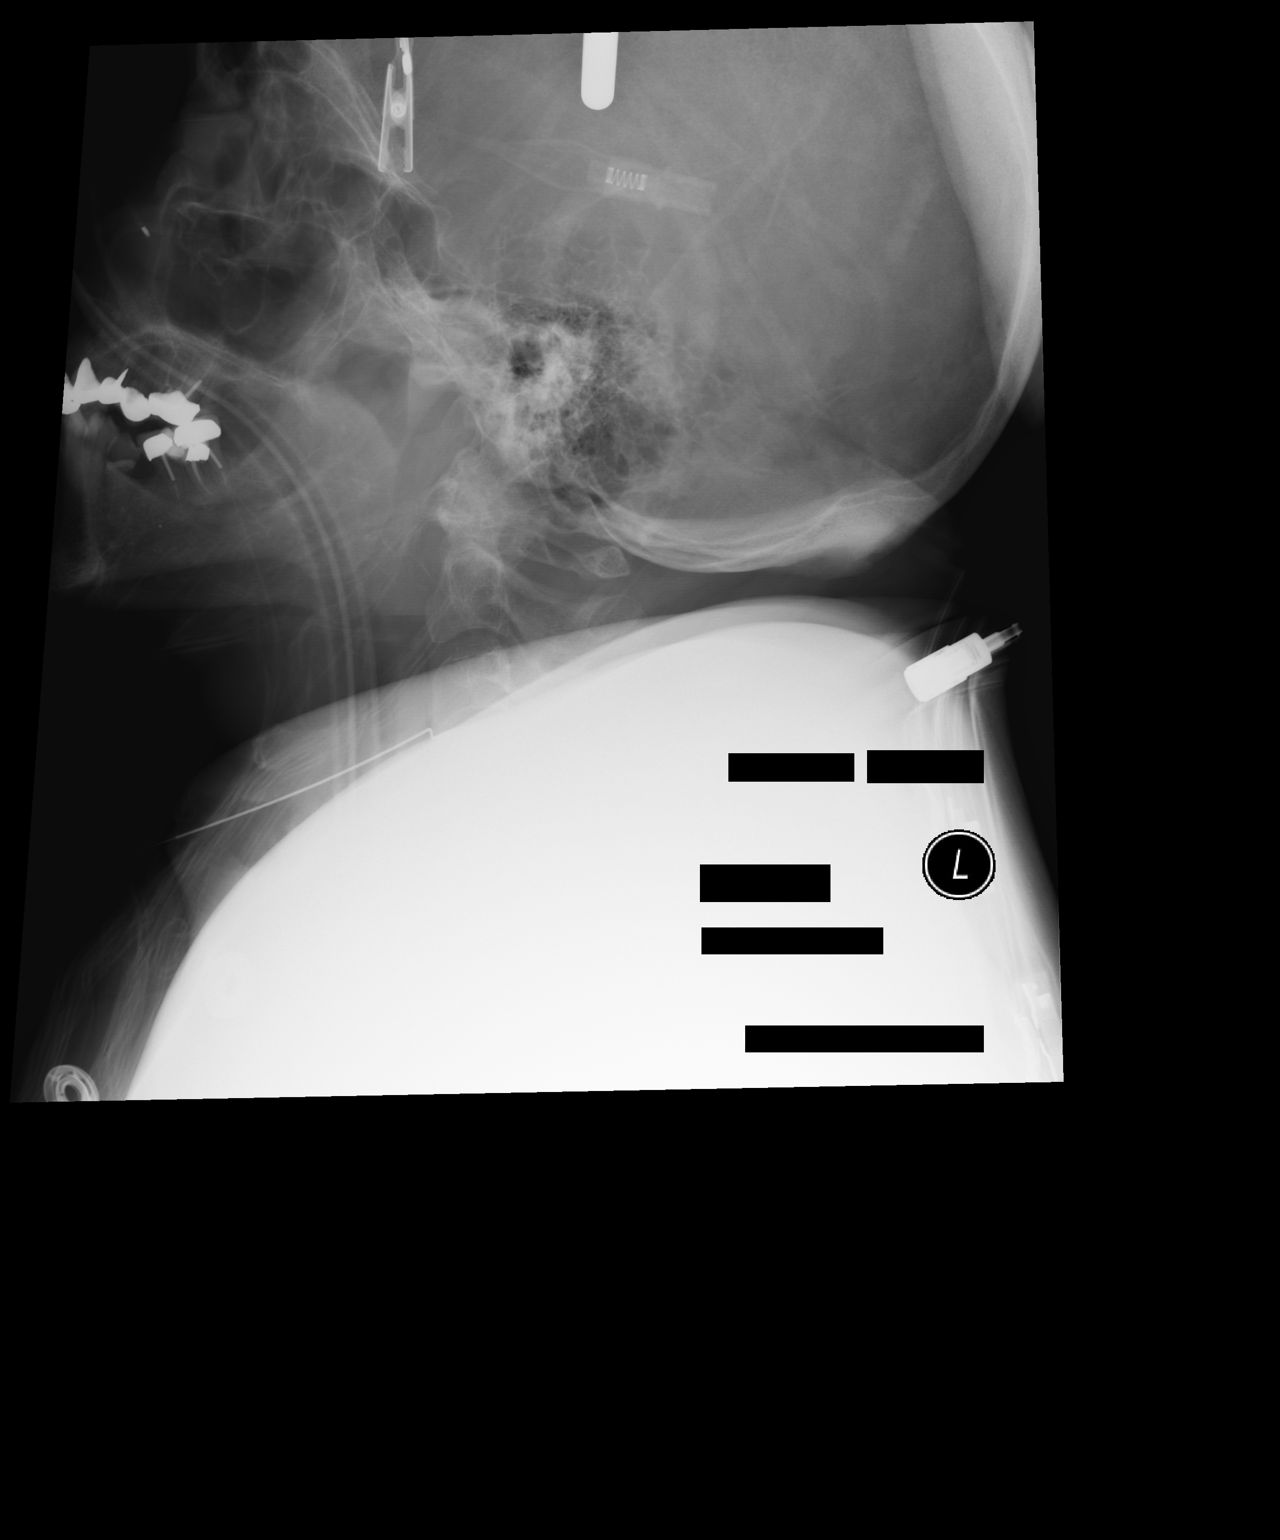

[1 of 1 positions shown; findings below may reference images not displayed]

FINDINGS: Cross-table lateral cervical image obtained. Metallic probe tip is
anterior to the inferior most aspect of the C3 vertebral body. Note
that most of the cervical spine is obscured by shoulder artifact.
Visualized upper cervical vertebral bodies appear intact.
IMPRESSION: Limited view of cervical spine due to shoulder artifact. Metallic
probe tip anterior to the inferior-most aspect of C3.

## 2021-08-16 ENCOUNTER — Other Ambulatory Visit: Payer: Self-pay | Admitting: Oncology

## 2021-08-16 DIAGNOSIS — D46C Myelodysplastic syndrome with isolated del(5q) chromosomal abnormality: Secondary | ICD-10-CM

## 2021-08-16 NOTE — Progress Notes (Signed)
[facility]  [address] [place],  [place]  [address] [phone]  Clinic Day:  08/17/2021  Referring physician: Hortencia Conradi, NP  HISTORY OF PRESENT ILLNESS:  The patient is a 66 y.o. 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  This was proven per cytogenetics done on her bone marrow biopsy in April 2019.  She was taking Revlimid.  However, due to severe diarrhea, her Revlimid was discontinued in July 2020.  Fortunately, her hemoglobin has been ideal over the past few years to where reintroduction of some form of therapy has not been necessary.  She comes in today to reestablish follow-up.  Overall, the patient claims to be doing fairly well.  She denies having increased fatigue or other symptoms which concern her for complications related to her underlying myelodysplasia.    PHYSICAL EXAM:  Blood pressure 138/75, pulse 73, temperature 99.2 F (37.3 C), resp. rate 16, height 5\' 2"  (1.575 m), weight 248 lb 4.8 oz (112.6 kg), SpO2 94 %. Wt Readings from Last 3 Encounters:  08/17/21 248 lb 4.8 oz (112.6 kg)  06/29/20 219 lb 1.6 oz (99.4 kg)  03/31/20 243 lb 9.6 oz (110.5 kg)   Body mass index is 45.41 kg/m. Performance status (ECOG): 1 - Symptomatic but completely ambulatory Physical Exam Constitutional:      Appearance: Normal appearance. She is not ill-appearing.  HENT:     Mouth/Throat:     Mouth: Mucous membranes are moist.     Pharynx: Oropharynx is clear. No oropharyngeal exudate or posterior oropharyngeal erythema.  Cardiovascular:     Rate and Rhythm: Normal rate and regular rhythm.     Heart sounds: No murmur heard.    No friction rub. No gallop.  Pulmonary:     Effort: Pulmonary effort is normal. No respiratory distress.     Breath sounds: Normal breath sounds. No wheezing, rhonchi or rales.  Abdominal:     General: Bowel sounds are normal. There is no distension.     Palpations: Abdomen is  soft. There is no mass.     Tenderness: There is no abdominal tenderness.  Musculoskeletal:        General: No swelling.     Right lower leg: No edema.     Left lower leg: No edema.  Lymphadenopathy:     Cervical: No cervical adenopathy.     Upper Body:     Right upper body: No supraclavicular or axillary adenopathy.     Left upper body: No supraclavicular or axillary adenopathy.     Lower Body: No right inguinal adenopathy. No left inguinal adenopathy.  Skin:    General: Skin is warm.     Coloration: Skin is not jaundiced.     Findings: No lesion or rash.  Neurological:     General: No focal deficit present.     Mental Status: She is alert and oriented to person, place, and time. Mental status is at baseline.  Psychiatric:        Mood and Affect: Mood normal.        Behavior: Behavior normal.        Thought Content: Thought content normal.    LABS:      Latest Ref Rng & Units 08/17/2021   12:00 AM 06/29/2020   12:00 AM 03/31/2020   12:00 AM  CBC  WBC  5.7     6.7  5.2      Hemoglobin 12.0 - 16.0  11.9     12.7  13.0      Hematocrit 36 - 46 37     38  39      Platelets 150 - 400 K/uL 282     208  203         This result is from an external source.      Latest Ref Rng & Units 08/17/2021   12:00 AM 06/29/2020   12:00 AM 03/31/2020   12:00 AM  CMP  BUN 4 - 21 9     20  16       Creatinine 0.5 - 1.1 1.1     0.9  1.1      Sodium 137 - 147 140     139  136      Potassium 3.5 - 5.1 mEq/L 4.3     4.1  4.1      Chloride 99 - 108 103     105  101      CO2 13 - 22 28     24  25       Calcium 8.7 - 10.7 9.4     9.3  8.4      Alkaline Phos 25 - 125 63     72  66      AST 13 - 35 35     23  27      ALT 7 - 35 U/L 34     24  23         This result is from an external source.   ASSESSMENT & PLAN:  Assessment/Plan:  A 66 y.o. 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  Although lower than what it has been previously, I remain pleased as her  hemoglobin remains at a very satisfactory level.  I am also pleased that her white cells and platelets remain normal.  As it appears her low-grade myelodysplasia (5 q minus syndrome)  is not causing her any problems, she will continue to be followed conservatively.  I will see her back in 4 months for repeat clinical assessment. The patient understands all the plans discussed today and is in agreement with them.    Erez Mccallum Kirby Funk, MD

## 2021-08-17 ENCOUNTER — Inpatient Hospital Stay: Payer: Medicare HMO | Attending: Oncology | Admitting: Oncology

## 2021-08-17 ENCOUNTER — Inpatient Hospital Stay: Payer: Medicare HMO

## 2021-08-17 ENCOUNTER — Telehealth: Payer: Self-pay | Admitting: Oncology

## 2021-08-17 VITALS — BP 138/75 | HR 73 | Temp 99.2°F | Resp 16 | Ht 62.0 in | Wt 248.3 lb

## 2021-08-17 DIAGNOSIS — D46C Myelodysplastic syndrome with isolated del(5q) chromosomal abnormality: Secondary | ICD-10-CM

## 2021-08-17 LAB — BASIC METABOLIC PANEL
BUN: 9 (ref 4–21)
CO2: 28 — AB (ref 13–22)
Chloride: 103 (ref 99–108)
Creatinine: 1.1 (ref 0.5–1.1)
Glucose: 104
Potassium: 4.3 mEq/L (ref 3.5–5.1)
Sodium: 140 (ref 137–147)

## 2021-08-17 LAB — HEPATIC FUNCTION PANEL
ALT: 34 U/L (ref 7–35)
AST: 35 (ref 13–35)
Alkaline Phosphatase: 63 (ref 25–125)
Bilirubin, Total: 0.7

## 2021-08-17 LAB — COMPREHENSIVE METABOLIC PANEL
Albumin: 4.5 (ref 3.5–5.0)
Calcium: 9.4 (ref 8.7–10.7)

## 2021-08-17 LAB — CBC: RBC: 3.66 — AB (ref 3.87–5.11)

## 2021-08-17 LAB — CBC AND DIFFERENTIAL
HCT: 37 (ref 36–46)
Hemoglobin: 11.9 — AB (ref 12.0–16.0)
Neutrophils Absolute: 2.68
Platelets: 282 10*3/uL (ref 150–400)
WBC: 5.7

## 2021-08-17 NOTE — Telephone Encounter (Signed)
Per 08/17/21 los next appt scheduled and confirmed with patient

## 2021-12-09 DIAGNOSIS — M5137 Other intervertebral disc degeneration, lumbosacral region: Secondary | ICD-10-CM | POA: Diagnosis not present

## 2021-12-17 ENCOUNTER — Other Ambulatory Visit: Payer: Medicare HMO

## 2021-12-17 ENCOUNTER — Ambulatory Visit: Payer: Medicare HMO | Admitting: Oncology

## 2021-12-19 NOTE — Progress Notes (Incomplete)
[facility]  [address] [place],  [place]  [address] [phone]  Clinic Day:  08/17/2021  Referring physician: Zoila Shutter, NP  HISTORY OF PRESENT ILLNESS:  The patient is a 66 y.o. 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  This was proven per cytogenetics done on her bone marrow biopsy in April 2019.  She was taking Revlimid.  However, due to severe diarrhea, her Revlimid was discontinued in July 2020.  Fortunately, her hemoglobin has been ideal over the past few years to where reintroduction of some form of therapy has not been necessary.  She comes in today to reestablish follow-up.  Overall, the patient claims to be doing fairly well.  She denies having increased fatigue or other symptoms which concern her for complications related to her underlying myelodysplasia.    PHYSICAL EXAM:  There were no vitals taken for this visit. Wt Readings from Last 3 Encounters:  08/17/21 248 lb 4.8 oz (112.6 kg)  06/29/20 219 lb 1.6 oz (99.4 kg)  03/31/20 243 lb 9.6 oz (110.5 kg)   There is no height or weight on file to calculate BMI. Performance status (ECOG): 1 - Symptomatic but completely ambulatory Physical Exam Constitutional:      Appearance: Normal appearance. She is not ill-appearing.  HENT:     Mouth/Throat:     Mouth: Mucous membranes are moist.     Pharynx: Oropharynx is clear. No oropharyngeal exudate or posterior oropharyngeal erythema.  Cardiovascular:     Rate and Rhythm: Normal rate and regular rhythm.     Heart sounds: No murmur heard.    No friction rub. No gallop.  Pulmonary:     Effort: Pulmonary effort is normal. No respiratory distress.     Breath sounds: Normal breath sounds. No wheezing, rhonchi or rales.  Abdominal:     General: Bowel sounds are normal. There is no distension.     Palpations: Abdomen is soft. There is no mass.     Tenderness: There is no abdominal tenderness.   Musculoskeletal:        General: No swelling.     Right lower leg: No edema.     Left lower leg: No edema.  Lymphadenopathy:     Cervical: No cervical adenopathy.     Upper Body:     Right upper body: No supraclavicular or axillary adenopathy.     Left upper body: No supraclavicular or axillary adenopathy.     Lower Body: No right inguinal adenopathy. No left inguinal adenopathy.  Skin:    General: Skin is warm.     Coloration: Skin is not jaundiced.     Findings: No lesion or rash.  Neurological:     General: No focal deficit present.     Mental Status: She is alert and oriented to person, place, and time. Mental status is at baseline.  Psychiatric:        Mood and Affect: Mood normal.        Behavior: Behavior normal.        Thought Content: Thought content normal.   LABS:      Latest Ref Rng & Units 08/17/2021   12:00 AM 06/29/2020   12:00 AM 03/31/2020   12:00 AM  CBC  WBC  5.7     6.7  5.2      Hemoglobin 12.0 - 16.0 11.9     12.7  13.0      Hematocrit 36 -  46 37     38  39      Platelets 150 - 400 K/uL 282     208  203         This result is from an external source.       Latest Ref Rng & Units 08/17/2021   12:00 AM 06/29/2020   12:00 AM 03/31/2020   12:00 AM  CMP  BUN 4 - _0 Creatinine 0.5 - 1.1 1.1     0.9  1.1      Sodium 137 - 147 140     139  136      Potassium 3.5 - 5.1 mEq/L 4.3     4.1  4.1      Chloride 99 - 108 103     105  101      CO2 13 - _1 Calcium 8.7 - 10.7 9.4     9.3  8.4      Alkaline Phos 25 - 125 63     72  66      AST 13 - 35 35     23  27      ALT 7 - 35 U/L 34     24  23         This result is from an external source.    ASSESSMENT & PLAN:  Assessment/Plan:  A 66 y.o. 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  Although lower than what it has been previously, I remain pleased as her hemoglobin remains at a very satisfactory level.  I am also pleased that her  white cells and platelets remain normal.  As it appears her low-grade myelodysplasia (5 q minus syndrome)  is not causing her any problems, she will continue to be followed conservatively.  I will see her back in 4 months for repeat clinical assessment. The patient understands all the plans discussed today and is in agreement with them.    Dajaun Goldring Macarthur Critchley, MD

## 2021-12-20 ENCOUNTER — Inpatient Hospital Stay: Payer: Medicare HMO | Admitting: Oncology

## 2021-12-20 ENCOUNTER — Inpatient Hospital Stay: Payer: Medicare HMO

## 2021-12-22 ENCOUNTER — Other Ambulatory Visit: Payer: Self-pay | Admitting: Oncology

## 2021-12-22 DIAGNOSIS — D46C Myelodysplastic syndrome with isolated del(5q) chromosomal abnormality: Secondary | ICD-10-CM

## 2021-12-22 NOTE — Progress Notes (Deleted)
[facility]  [address] [place],  [place]  [address] [phone]  Clinic Day:  08/17/2021  Referring physician: George, Robert E, NP  HISTORY OF PRESENT ILLNESS:  The patient is a 66 y.o. 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  This was proven per cytogenetics done on her bone marrow biopsy in April 2019.  She was taking Revlimid.  However, due to severe diarrhea, her Revlimid was discontinued in July 2020.  Fortunately, her hemoglobin has been ideal over the past few years to where reintroduction of some form of therapy has not been necessary.  She comes in today to reestablish follow-up.  Overall, the patient claims to be doing fairly well.  She denies having increased fatigue or other symptoms which concern her for complications related to her underlying myelodysplasia.    PHYSICAL EXAM:  There were no vitals taken for this visit. Wt Readings from Last 3 Encounters:  08/17/21 248 lb 4.8 oz (112.6 kg)  06/29/20 219 lb 1.6 oz (99.4 kg)  03/31/20 243 lb 9.6 oz (110.5 kg)   There is no height or weight on file to calculate BMI. Performance status (ECOG): 1 - Symptomatic but completely ambulatory Physical Exam Constitutional:      Appearance: Normal appearance. She is not ill-appearing.  HENT:     Mouth/Throat:     Mouth: Mucous membranes are moist.     Pharynx: Oropharynx is clear. No oropharyngeal exudate or posterior oropharyngeal erythema.  Cardiovascular:     Rate and Rhythm: Normal rate and regular rhythm.     Heart sounds: No murmur heard.    No friction rub. No gallop.  Pulmonary:     Effort: Pulmonary effort is normal. No respiratory distress.     Breath sounds: Normal breath sounds. No wheezing, rhonchi or rales.  Abdominal:     General: Bowel sounds are normal. There is no distension.     Palpations: Abdomen is soft. There is no mass.     Tenderness: There is no abdominal tenderness.   Musculoskeletal:        General: No swelling.     Right lower leg: No edema.     Left lower leg: No edema.  Lymphadenopathy:     Cervical: No cervical adenopathy.     Upper Body:     Right upper body: No supraclavicular or axillary adenopathy.     Left upper body: No supraclavicular or axillary adenopathy.     Lower Body: No right inguinal adenopathy. No left inguinal adenopathy.  Skin:    General: Skin is warm.     Coloration: Skin is not jaundiced.     Findings: No lesion or rash.  Neurological:     General: No focal deficit present.     Mental Status: She is alert and oriented to person, place, and time. Mental status is at baseline.  Psychiatric:        Mood and Affect: Mood normal.        Behavior: Behavior normal.        Thought Content: Thought content normal.   LABS:      Latest Ref Rng & Units 08/17/2021   12:00 AM 06/29/2020   12:00 AM 03/31/2020   12:00 AM  CBC  WBC  5.7     6.7  5.2      Hemoglobin 12.0 - 16.0 11.9     12.7  13.0      Hematocrit 36 -   46 37     38  39      Platelets 150 - 400 K/uL 282     208  203         This result is from an external source.       Latest Ref Rng & Units 08/17/2021   12:00 AM 06/29/2020   12:00 AM 03/31/2020   12:00 AM  CMP  BUN 4 - _0 Creatinine 0.5 - 1.1 1.1     0.9  1.1      Sodium 137 - 147 140     139  136      Potassium 3.5 - 5.1 mEq/L 4.3     4.1  4.1      Chloride 99 - 108 103     105  101      CO2 13 - _1 Calcium 8.7 - 10.7 9.4     9.3  8.4      Alkaline Phos 25 - 125 63     72  66      AST 13 - 35 35     23  27      ALT 7 - 35 U/L 34     24  23         This result is from an external source.    ASSESSMENT & PLAN:  Assessment/Plan:  A 66 y.o. 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  Although lower than what it has been previously, I remain pleased as her hemoglobin remains at a very satisfactory level.  I am also pleased that her  white cells and platelets remain normal.  As it appears her low-grade myelodysplasia (5 q minus syndrome)  is not causing her any problems, she will continue to be followed conservatively.  I will see her back in 4 months for repeat clinical assessment. The patient understands all the plans discussed today and is in agreement with them.    Trew Sunde Macarthur Critchley, MD

## 2021-12-23 ENCOUNTER — Inpatient Hospital Stay: Payer: Medicare HMO

## 2021-12-23 ENCOUNTER — Inpatient Hospital Stay: Payer: Medicare HMO | Admitting: Oncology

## 2021-12-27 NOTE — Progress Notes (Signed)
[facility]  [address] [place],  [place]  [address] [phone]  Clinic Day:  12/28/2021  Referring physician: George, Robert E, NP  HISTORY OF PRESENT ILLNESS:  The patient is a 66 y.o. 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  This was proven per cytogenetics done on her bone marrow biopsy in April 2019.  She briefly took Revlimid.  However, due to severe diarrhea, her Revlimid was discontinued in July 2020.  Fortunately, her hemoglobin has been ideal over the past few years to where reintroduction of some form of therapy has not been necessary.  She comes in today to reestablish follow-up.  Overall, the patient claims to be doing fairly well.  She denies having significant fatigue or other symptoms which concern her for complications related to her underlying myelodysplasia.    PHYSICAL EXAM:  Blood pressure (!) 153/82, pulse 67, temperature 99.5 F (37.5 C), resp. rate 16, height 5' 2" (1.575 m), weight 254 lb 14.4 oz (115.6 kg), SpO2 93 %. Wt Readings from Last 3 Encounters:  12/28/21 254 lb 14.4 oz (115.6 kg)  08/17/21 248 lb 4.8 oz (112.6 kg)  06/29/20 219 lb 1.6 oz (99.4 kg)   Body mass index is 46.62 kg/m. Performance status (ECOG): 1 - Symptomatic but completely ambulatory Physical Exam Constitutional:      Appearance: Normal appearance. She is not ill-appearing.  HENT:     Mouth/Throat:     Mouth: Mucous membranes are moist.     Pharynx: Oropharynx is clear. No oropharyngeal exudate or posterior oropharyngeal erythema.  Cardiovascular:     Rate and Rhythm: Normal rate and regular rhythm.     Heart sounds: No murmur heard.    No friction rub. No gallop.  Pulmonary:     Effort: Pulmonary effort is normal. No respiratory distress.     Breath sounds: Normal breath sounds. No wheezing, rhonchi or rales.  Abdominal:     General: Bowel sounds are normal. There is no distension.     Palpations:  Abdomen is soft. There is no mass.     Tenderness: There is no abdominal tenderness.  Musculoskeletal:        General: No swelling.     Right lower leg: No edema.     Left lower leg: No edema.  Lymphadenopathy:     Cervical: No cervical adenopathy.     Upper Body:     Right upper body: No supraclavicular or axillary adenopathy.     Left upper body: No supraclavicular or axillary adenopathy.     Lower Body: No right inguinal adenopathy. No left inguinal adenopathy.  Skin:    General: Skin is warm.     Coloration: Skin is not jaundiced.     Findings: No lesion or rash.  Neurological:     General: No focal deficit present.     Mental Status: She is alert and oriented to person, place, and time. Mental status is at baseline.  Psychiatric:        Mood and Affect: Mood normal.        Behavior: Behavior normal.        Thought Content: Thought content normal.    LABS:       Latest Ref Rng & Units 08/17/2021   12:00 AM 06/29/2020   12:00 AM 03/31/2020   12:00 AM  CBC  WBC  5.7     6.7  5.2      Hemoglobin 12.0 -   16.0 11.9     12.7  13.0      Hematocrit 36 - 46 37     38  39      Platelets 150 - 400 K/uL 282     208  203         This result is from an external source.      Latest Ref Rng & Units 12/28/2021    2:12 PM 08/17/2021   12:00 AM 06/29/2020   12:00 AM  CMP  Glucose 70 - 99 mg/dL 124     BUN 8 - 23 mg/dL 12  9     20   Creatinine 0.44 - 1.00 mg/dL 1.13  1.1     0.9   Sodium 135 - 145 mmol/L 141  140     139   Potassium 3.5 - 5.1 mmol/L 3.9  4.3     4.1   Chloride 98 - 111 mmol/L 109  103     105   CO2 22 - 32 mmol/L 23  28     24   Calcium 8.9 - 10.3 mg/dL 8.8  9.4     9.3   Total Protein 6.5 - 8.1 g/dL 7.5     Total Bilirubin 0.3 - 1.2 mg/dL 0.3     Alkaline Phos 38 - 126 U/L 51  63     72   AST 15 - 41 U/L 24  35     23   ALT 0 - 44 U/L 21  34     24      This result is from an external source.   ASSESSMENT & PLAN:  Assessment/Plan:  A 66 y.o. 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  Although lower than what it has been previously, I remain pleased as her hemoglobin remains at a satisfactory level.  I am also pleased that her white cells and platelets remain normal.  As it appears her low-grade myelodysplasia (5 q minus syndrome)  is not causing her any problems, she will continue to be followed conservatively.  I will see her back in 4 months for repeat clinical assessment. The patient understands all the plans discussed today and is in agreement with them.     A , MD      

## 2021-12-28 ENCOUNTER — Inpatient Hospital Stay: Payer: Medicare HMO | Admitting: Oncology

## 2021-12-28 ENCOUNTER — Telehealth: Payer: Self-pay | Admitting: Oncology

## 2021-12-28 ENCOUNTER — Other Ambulatory Visit: Payer: Self-pay | Admitting: Oncology

## 2021-12-28 ENCOUNTER — Inpatient Hospital Stay: Payer: Medicare HMO | Attending: Oncology

## 2021-12-28 VITALS — BP 153/82 | HR 67 | Temp 99.5°F | Resp 16 | Ht 62.0 in | Wt 254.9 lb

## 2021-12-28 DIAGNOSIS — D649 Anemia, unspecified: Secondary | ICD-10-CM | POA: Diagnosis not present

## 2021-12-28 DIAGNOSIS — D46C Myelodysplastic syndrome with isolated del(5q) chromosomal abnormality: Secondary | ICD-10-CM

## 2021-12-28 LAB — CMP (CANCER CENTER ONLY)
ALT: 21 U/L (ref 0–44)
AST: 24 U/L (ref 15–41)
Albumin: 4 g/dL (ref 3.5–5.0)
Alkaline Phosphatase: 51 U/L (ref 38–126)
Anion gap: 9 (ref 5–15)
BUN: 12 mg/dL (ref 8–23)
CO2: 23 mmol/L (ref 22–32)
Calcium: 8.8 mg/dL — ABNORMAL LOW (ref 8.9–10.3)
Chloride: 109 mmol/L (ref 98–111)
Creatinine: 1.13 mg/dL — ABNORMAL HIGH (ref 0.44–1.00)
GFR, Estimated: 54 mL/min — ABNORMAL LOW (ref >60)
Glucose, Bld: 124 mg/dL — ABNORMAL HIGH (ref 70–99)
Potassium: 3.9 mmol/L (ref 3.5–5.1)
Sodium: 141 mmol/L (ref 135–145)
Total Bilirubin: 0.3 mg/dL (ref 0.3–1.2)
Total Protein: 7.5 g/dL (ref 6.5–8.1)

## 2021-12-28 LAB — CBC AND DIFFERENTIAL
HCT: 33 — AB (ref 36–46)
Hemoglobin: 11 — AB (ref 12.0–16.0)
Neutrophils Absolute: 2.83
Platelets: 227 10*3/uL (ref 150–400)
WBC: 4.8

## 2021-12-28 LAB — CBC: RBC: 3.22 — AB (ref 3.87–5.11)

## 2021-12-28 NOTE — Telephone Encounter (Signed)
12/28/21 Next appt scheduled and confirmed with patient 

## 2021-12-29 ENCOUNTER — Telehealth: Payer: Self-pay

## 2021-12-29 LAB — IRON,TIBC AND FERRITIN PANEL
%SAT: 37.3
Ferritin: 254
Iron: 108
TIBC: 289

## 2021-12-29 LAB — VITAMIN B12: Vitamin B-12: 312

## 2021-12-29 NOTE — Telephone Encounter (Signed)
Pt notified of Dr Bobby Rumpf' response.

## 2022-01-02 DIAGNOSIS — F3341 Major depressive disorder, recurrent, in partial remission: Secondary | ICD-10-CM | POA: Diagnosis not present

## 2022-01-02 DIAGNOSIS — I10 Essential (primary) hypertension: Secondary | ICD-10-CM | POA: Diagnosis not present

## 2022-01-02 DIAGNOSIS — D61810 Antineoplastic chemotherapy induced pancytopenia: Secondary | ICD-10-CM | POA: Diagnosis not present

## 2022-01-02 DIAGNOSIS — M170 Bilateral primary osteoarthritis of knee: Secondary | ICD-10-CM | POA: Diagnosis not present

## 2022-01-02 DIAGNOSIS — D638 Anemia in other chronic diseases classified elsewhere: Secondary | ICD-10-CM | POA: Diagnosis not present

## 2022-01-02 DIAGNOSIS — D508 Other iron deficiency anemias: Secondary | ICD-10-CM | POA: Diagnosis not present

## 2022-01-02 DIAGNOSIS — E559 Vitamin D deficiency, unspecified: Secondary | ICD-10-CM | POA: Diagnosis not present

## 2022-01-02 DIAGNOSIS — E782 Mixed hyperlipidemia: Secondary | ICD-10-CM | POA: Diagnosis not present

## 2022-01-02 DIAGNOSIS — M1711 Unilateral primary osteoarthritis, right knee: Secondary | ICD-10-CM | POA: Diagnosis not present

## 2022-01-07 DIAGNOSIS — K219 Gastro-esophageal reflux disease without esophagitis: Secondary | ICD-10-CM | POA: Diagnosis not present

## 2022-01-07 DIAGNOSIS — E038 Other specified hypothyroidism: Secondary | ICD-10-CM | POA: Diagnosis not present

## 2022-01-07 DIAGNOSIS — E559 Vitamin D deficiency, unspecified: Secondary | ICD-10-CM | POA: Diagnosis not present

## 2022-01-07 DIAGNOSIS — I10 Essential (primary) hypertension: Secondary | ICD-10-CM | POA: Diagnosis not present

## 2022-01-07 DIAGNOSIS — E782 Mixed hyperlipidemia: Secondary | ICD-10-CM | POA: Diagnosis not present

## 2022-01-07 DIAGNOSIS — D518 Other vitamin B12 deficiency anemias: Secondary | ICD-10-CM | POA: Diagnosis not present

## 2022-01-07 DIAGNOSIS — E119 Type 2 diabetes mellitus without complications: Secondary | ICD-10-CM | POA: Diagnosis not present

## 2022-01-07 DIAGNOSIS — Z5181 Encounter for therapeutic drug level monitoring: Secondary | ICD-10-CM | POA: Diagnosis not present

## 2022-01-07 DIAGNOSIS — Z6841 Body Mass Index (BMI) 40.0 and over, adult: Secondary | ICD-10-CM | POA: Diagnosis not present

## 2022-01-07 DIAGNOSIS — M5137 Other intervertebral disc degeneration, lumbosacral region: Secondary | ICD-10-CM | POA: Diagnosis not present

## 2022-01-12 NOTE — Progress Notes (Deleted)
01/12/2022 [name] [id number] 1955/04/15  Referring provider: Zoila Shutter, NP Primary GI doctor: Dr. Henrene Pastor  ASSESSMENT AND PLAN:   There are no diagnoses linked to this encounter.   History of Present Illness:  67 y.o. female  with a past medical history of CVA 2017, anxiety, Bell's palsy, IDA, personal history of colon polyps, GERD, hypertension, history of kidney stones, obesity, myelodysplastic syndrome, history of PE in 2010 and 2016, status post cervical neck surgery 2021 and others listed below, returns to clinic today for evaluation of ***. 03/13/2019 colonoscopy Dr. Henrene Pastor completely unremarkable other than internal hemorrhoids recall 10 years ***  She  reports that she has never smoked. She has never used smokeless tobacco. She reports current alcohol use. She reports that she does not use drugs. Her family history includes Alzheimer's disease in an other family member; Cancer in her mother; Heart disease in an other family member; Hypertension in her father; Kidney disease in an other family member.   Current Medications:   Current Outpatient Medications (Endocrine & Metabolic):    ibandronate (BONIVA) 150 MG tablet, 1 tablet   progesterone (PROMETRIUM) 200 MG capsule, 1 capsule  Current Outpatient Medications (Cardiovascular):    atenolol (TENORMIN) 50 MG tablet, Take 50 mg by mouth at bedtime.    cholestyramine (QUESTRAN) 4 g packet, 1 packet mixed with water or non-carbonated drink   furosemide (LASIX) 20 MG tablet, Take 1 tablet by mouth daily.   simvastatin (ZOCOR) 20 MG tablet, 1 tablet in the evening  Current Outpatient Medications (Respiratory):    fluticasone (FLONASE) 50 MCG/ACT nasal spray, Place 1 spray into both nostrils at bedtime.   Current Outpatient Medications (Analgesics):    allopurinol (ZYLOPRIM) 100 MG tablet, Take 1 tablet by mouth daily.   HYDROcodone-acetaminophen (NORCO) 10-325 MG tablet, 1 tablet as needed   SUMAtriptan  (IMITREX) 25 MG tablet, 1 tablet at least 2 hours between doses as needed  Current Outpatient Medications (Hematological):    rivaroxaban (XARELTO) 20 MG TABS tablet, Take 20 mg by mouth daily with supper.  Current Outpatient Medications (Other):    ARIPiprazole (ABILIFY) 5 MG tablet, Take 1 tablet by mouth daily.   buPROPion (WELLBUTRIN XL) 150 MG 24 hr tablet, Take 150 mg by mouth daily.   calcium carbonate (SUPER CALCIUM) 1500 (600 Ca) MG TABS tablet, 1 tablet with meals   diphenoxylate-atropine (LOMOTIL) 2.5-0.025 MG tablet, Take 1 tablet by mouth 4 (four) times daily as needed for diarrhea or loose stools.    escitalopram (LEXAPRO) 10 MG tablet, Take 10 mg by mouth daily.   esomeprazole (NEXIUM 24HR) 20 MG capsule, Take 20 mg by mouth daily before breakfast.   gabapentin (NEURONTIN) 600 MG tablet, Take 600 mg by mouth 3 (three) times daily.   methocarbamol (ROBAXIN) 500 MG tablet, Take 1 tablet (500 mg total) by mouth every 6 (six) hours as needed for muscle spasms.   omeprazole (PRILOSEC OTC) 20 MG tablet, 1 tablet 30 minutes before morning meal   polyvinyl alcohol-povidone (REFRESH) 1.4-0.6 % ophthalmic solution, Place 1-2 drops into both eyes as needed (for dry eyes).   potassium chloride (MICRO-K) 10 MEQ CR capsule, TAKE 1 CAPSULE BY MOUTH WITH FOOD DAILY   sertraline (ZOLOFT) 100 MG tablet, Take 1 tablet by mouth daily.   topiramate (TOPAMAX) 50 MG tablet, Take by mouth.   Vitamin D, Ergocalciferol, (DRISDOL) 1.25 MG (50000 UNIT) CAPS capsule, TAKE 1 CAPSULE BY MOUTH TWICE A WEEK   zolpidem (AMBIEN) 10  MG tablet, 1 tablet at bedtime as needed  Surgical History:  She  has a past surgical history that includes Tubal ligation (Bilateral, 06/2004); Parotid gland tumor excision (1990); Endometrial ablation; Colonoscopy; Colonoscopy w/ polypectomy; Laminectomy and microdiscectomy spine (Right, 12/2008; 01/29/2009); Diagnostic laparoscopy; Bladder surgery; Joint replacement; Total knee  arthroplasty (Right, 06/22/2015); Breast biopsy (Right, 1999); Biopsy thyroid (2010); Total knee arthroplasty (Right, 06/22/2015); Anterior lat lumbar fusion (N/A, 08/02/2016); Back surgery; Anterior cervical decomp/discectomy fusion (N/A, 07/11/2019); and Carpal tunnel release (Right, 07/11/2019).  Current Medications, Allergies, Past Medical History, Past Surgical History, Family History and Social History were reviewed in [employer] record.  Physical Exam: There were no vitals taken for this visit. General:   Pleasant, well developed female in no acute distress Heart : Regular rate and rhythm; no murmurs Pulm: Clear anteriorly; no wheezing Abdomen:  {BlankSingle:19197::"Distended","Ridged","Soft"}, {BlankSingle:19197::"Flat","Obese","Non-distended"} AB, {BlankSingle:19197::"Absent","Hyperactive, tinkling","Hypoactive","Sluggish","Active"} bowel sounds. {actendernessAB:27319} tenderness {anatomy; site abdomen:5010}. {BlankMultiple:19196::"Without guarding","With guarding","Without rebound","With rebound"}, No organomegaly appreciated. Rectal: {acrectalexam:27461} Extremities:  {With/without:5700}  edema. Neurologic:  Alert and  oriented x4;  No focal deficits.  Psych:  Cooperative. Normal mood and affect.   Vladimir Crofts, PA-C 01/12/22

## 2022-01-13 ENCOUNTER — Ambulatory Visit: Payer: Medicare HMO | Admitting: Physician Assistant

## 2022-01-13 ENCOUNTER — Telehealth: Payer: Self-pay | Admitting: Physician Assistant

## 2022-01-13 NOTE — Telephone Encounter (Signed)
Good Morning [name],  Patient called stating that she would not be able to come in for her appointment with you today at 11:00 due to being sick from her covid booster shot.   Patient stated that she will call back to reschedule at a later time.

## 2022-01-18 ENCOUNTER — Ambulatory Visit: Payer: Medicare HMO | Admitting: Internal Medicine

## 2022-01-18 ENCOUNTER — Telehealth: Payer: Self-pay | Admitting: Internal Medicine

## 2022-01-18 NOTE — Telephone Encounter (Signed)
Good Morning Dr. Henrene Pastor,   Patient called stating she needed to reschedule her appointment with you this afternoon for 3:40 due to her having a spinal cord inhury and the weather causing her to be in pain.    Patient was rescheduled for 2/2 at 2:30 with Vicie Mutters.

## 2022-02-02 DIAGNOSIS — M170 Bilateral primary osteoarthritis of knee: Secondary | ICD-10-CM | POA: Diagnosis not present

## 2022-02-02 DIAGNOSIS — D61810 Antineoplastic chemotherapy induced pancytopenia: Secondary | ICD-10-CM | POA: Diagnosis not present

## 2022-02-02 DIAGNOSIS — M1711 Unilateral primary osteoarthritis, right knee: Secondary | ICD-10-CM | POA: Diagnosis not present

## 2022-02-02 DIAGNOSIS — F3341 Major depressive disorder, recurrent, in partial remission: Secondary | ICD-10-CM | POA: Diagnosis not present

## 2022-02-02 DIAGNOSIS — I10 Essential (primary) hypertension: Secondary | ICD-10-CM | POA: Diagnosis not present

## 2022-02-02 DIAGNOSIS — E559 Vitamin D deficiency, unspecified: Secondary | ICD-10-CM | POA: Diagnosis not present

## 2022-02-02 DIAGNOSIS — D638 Anemia in other chronic diseases classified elsewhere: Secondary | ICD-10-CM | POA: Diagnosis not present

## 2022-02-02 DIAGNOSIS — D508 Other iron deficiency anemias: Secondary | ICD-10-CM | POA: Diagnosis not present

## 2022-02-02 DIAGNOSIS — E782 Mixed hyperlipidemia: Secondary | ICD-10-CM | POA: Diagnosis not present

## 2022-02-02 NOTE — Progress Notes (Deleted)
02/02/2022 [name] [id number] [birth year]  Referring provider: Zoila Shutter, NP Primary GI doctor: Dr. Henrene Pastor  ASSESSMENT AND PLAN:   There are no diagnoses linked to this encounter.   History of Present Illness:  67 y.o. female  with a past medical history of CVA 2017, anxiety, Bell's palsy, IDA, personal history of colon polyps, GERD, hypertension, history of kidney stones, obesity, myelodysplastic syndrome, history of PE in 2010 and 2016, status post cervical neck surgery 2021 and others listed below, returns to clinic today for evaluation of hernia.  03/13/2019 colonoscopy Dr. Henrene Pastor completely unremarkable other than internal hemorrhoids recall 10 years ***  Recent Labs    08/17/21 0000 12/28/21 1416  HGB 11.9* 11.0*     She  reports that she has never smoked. She has never used smokeless tobacco. She reports current alcohol use. She reports that she does not use drugs. Her family history includes Alzheimer's disease in an other family member; Cancer in her mother; Heart disease in an other family member; Hypertension in her father; Kidney disease in an other family member.   Current Medications:   Current Outpatient Medications (Endocrine & Metabolic):    ibandronate (BONIVA) 150 MG tablet, 1 tablet   progesterone (PROMETRIUM) 200 MG capsule, 1 capsule  Current Outpatient Medications (Cardiovascular):    atenolol (TENORMIN) 50 MG tablet, Take 50 mg by mouth at bedtime.    cholestyramine (QUESTRAN) 4 g packet, 1 packet mixed with water or non-carbonated drink   furosemide (LASIX) 20 MG tablet, Take 1 tablet by mouth daily.   simvastatin (ZOCOR) 20 MG tablet, 1 tablet in the evening  Current Outpatient Medications (Respiratory):    fluticasone (FLONASE) 50 MCG/ACT nasal spray, Place 1 spray into both nostrils at bedtime.   Current Outpatient Medications (Analgesics):    allopurinol (ZYLOPRIM) 100 MG tablet, Take 1 tablet by mouth daily.    HYDROcodone-acetaminophen (NORCO) 10-325 MG tablet, 1 tablet as needed   SUMAtriptan (IMITREX) 25 MG tablet, 1 tablet at least 2 hours between doses as needed  Current Outpatient Medications (Hematological):    rivaroxaban (XARELTO) 20 MG TABS tablet, Take 20 mg by mouth daily with supper.  Current Outpatient Medications (Other):    ARIPiprazole (ABILIFY) 5 MG tablet, Take 1 tablet by mouth daily.   buPROPion (WELLBUTRIN XL) 150 MG 24 hr tablet, Take 150 mg by mouth daily.   calcium carbonate (SUPER CALCIUM) 1500 (600 Ca) MG TABS tablet, 1 tablet with meals   diphenoxylate-atropine (LOMOTIL) 2.5-0.025 MG tablet, Take 1 tablet by mouth 4 (four) times daily as needed for diarrhea or loose stools.    escitalopram (LEXAPRO) 10 MG tablet, Take 10 mg by mouth daily.   esomeprazole (NEXIUM 24HR) 20 MG capsule, Take 20 mg by mouth daily before breakfast.   gabapentin (NEURONTIN) 600 MG tablet, Take 600 mg by mouth 3 (three) times daily.   methocarbamol (ROBAXIN) 500 MG tablet, Take 1 tablet (500 mg total) by mouth every 6 (six) hours as needed for muscle spasms.   omeprazole (PRILOSEC OTC) 20 MG tablet, 1 tablet 30 minutes before morning meal   polyvinyl alcohol-povidone (REFRESH) 1.4-0.6 % ophthalmic solution, Place 1-2 drops into both eyes as needed (for dry eyes).   potassium chloride (MICRO-K) 10 MEQ CR capsule, TAKE 1 CAPSULE BY MOUTH WITH FOOD DAILY   sertraline (ZOLOFT) 100 MG tablet, Take 1 tablet by mouth daily.   topiramate (TOPAMAX) 50 MG tablet, Take by mouth.   Vitamin D, Ergocalciferol, (DRISDOL) 1.25  MG (50000 UNIT) CAPS capsule, TAKE 1 CAPSULE BY MOUTH TWICE A WEEK   zolpidem (AMBIEN) 10 MG tablet, 1 tablet at bedtime as needed  Surgical History:  She  has a past surgical history that includes Tubal ligation (Bilateral, 06/2004); Parotid gland tumor excision (1990); Endometrial ablation; Colonoscopy; Colonoscopy w/ polypectomy; Laminectomy and microdiscectomy spine (Right, 12/2008;  01/29/2009); Diagnostic laparoscopy; Bladder surgery; Joint replacement; Total knee arthroplasty (Right, 06/22/2015); Breast biopsy (Right, 1999); Biopsy thyroid (2010); Total knee arthroplasty (Right, 06/22/2015); Anterior lat lumbar fusion (N/A, 08/02/2016); Back surgery; Anterior cervical decomp/discectomy fusion (N/A, 07/11/2019); and Carpal tunnel release (Right, 07/11/2019).  Current Medications, Allergies, Past Medical History, Past Surgical History, Family History and Social History were reviewed in [employer] record.  Physical Exam: There were no vitals taken for this visit. General:   Pleasant, well developed female in no acute distress Heart : Regular rate and rhythm; no murmurs Pulm: Clear anteriorly; no wheezing Abdomen:  {BlankSingle:19197::"Distended","Ridged","Soft"}, {BlankSingle:19197::"Flat","Obese","Non-distended"} AB, {BlankSingle:19197::"Absent","Hyperactive, tinkling","Hypoactive","Sluggish","Active"} bowel sounds. {actendernessAB:27319} tenderness {anatomy; site abdomen:5010}. {BlankMultiple:19196::"Without guarding","With guarding","Without rebound","With rebound"}, No organomegaly appreciated. Rectal: {acrectalexam:27461} Extremities:  {With/without:5700}  edema. Neurologic:  Alert and  oriented x4;  No focal deficits.  Psych:  Cooperative. Normal mood and affect.   Vladimir Crofts, PA-C 02/02/22

## 2022-02-04 ENCOUNTER — Ambulatory Visit: Payer: Medicare HMO | Admitting: Physician Assistant

## 2022-02-04 ENCOUNTER — Telehealth: Payer: Self-pay | Admitting: Physician Assistant

## 2022-02-04 NOTE — Telephone Encounter (Signed)
Good Morning [name],   Patient called stating that she needed to reschedule her appointment with you this afternoon for 2:30 due to her having a fever as well as a upper respiratory infection.   Patient was rescheduled for 3/4 at 3:00 with you.

## 2022-02-08 DIAGNOSIS — I10 Essential (primary) hypertension: Secondary | ICD-10-CM | POA: Diagnosis not present

## 2022-02-08 DIAGNOSIS — K219 Gastro-esophageal reflux disease without esophagitis: Secondary | ICD-10-CM | POA: Diagnosis not present

## 2022-02-08 DIAGNOSIS — G43009 Migraine without aura, not intractable, without status migrainosus: Secondary | ICD-10-CM | POA: Diagnosis not present

## 2022-02-08 DIAGNOSIS — E782 Mixed hyperlipidemia: Secondary | ICD-10-CM | POA: Diagnosis not present

## 2022-02-08 DIAGNOSIS — Z5181 Encounter for therapeutic drug level monitoring: Secondary | ICD-10-CM | POA: Diagnosis not present

## 2022-02-08 DIAGNOSIS — M5137 Other intervertebral disc degeneration, lumbosacral region: Secondary | ICD-10-CM | POA: Diagnosis not present

## 2022-02-08 DIAGNOSIS — J302 Other seasonal allergic rhinitis: Secondary | ICD-10-CM | POA: Diagnosis not present

## 2022-02-09 DIAGNOSIS — S0181XA Laceration without foreign body of other part of head, initial encounter: Secondary | ICD-10-CM | POA: Diagnosis not present

## 2022-03-02 DIAGNOSIS — M170 Bilateral primary osteoarthritis of knee: Secondary | ICD-10-CM | POA: Diagnosis not present

## 2022-03-02 DIAGNOSIS — M1711 Unilateral primary osteoarthritis, right knee: Secondary | ICD-10-CM | POA: Diagnosis not present

## 2022-03-02 DIAGNOSIS — D508 Other iron deficiency anemias: Secondary | ICD-10-CM | POA: Diagnosis not present

## 2022-03-02 DIAGNOSIS — D638 Anemia in other chronic diseases classified elsewhere: Secondary | ICD-10-CM | POA: Diagnosis not present

## 2022-03-02 DIAGNOSIS — K219 Gastro-esophageal reflux disease without esophagitis: Secondary | ICD-10-CM | POA: Diagnosis not present

## 2022-03-02 DIAGNOSIS — G4700 Insomnia, unspecified: Secondary | ICD-10-CM | POA: Diagnosis not present

## 2022-03-02 DIAGNOSIS — I10 Essential (primary) hypertension: Secondary | ICD-10-CM | POA: Diagnosis not present

## 2022-03-02 DIAGNOSIS — E782 Mixed hyperlipidemia: Secondary | ICD-10-CM | POA: Diagnosis not present

## 2022-03-02 DIAGNOSIS — G43009 Migraine without aura, not intractable, without status migrainosus: Secondary | ICD-10-CM | POA: Diagnosis not present

## 2022-03-02 DIAGNOSIS — F3341 Major depressive disorder, recurrent, in partial remission: Secondary | ICD-10-CM | POA: Diagnosis not present

## 2022-03-02 DIAGNOSIS — E559 Vitamin D deficiency, unspecified: Secondary | ICD-10-CM | POA: Diagnosis not present

## 2022-03-02 DIAGNOSIS — D61810 Antineoplastic chemotherapy induced pancytopenia: Secondary | ICD-10-CM | POA: Diagnosis not present

## 2022-03-02 DIAGNOSIS — J069 Acute upper respiratory infection, unspecified: Secondary | ICD-10-CM | POA: Diagnosis not present

## 2022-03-03 DIAGNOSIS — Z5181 Encounter for therapeutic drug level monitoring: Secondary | ICD-10-CM | POA: Diagnosis not present

## 2022-03-07 ENCOUNTER — Ambulatory Visit: Payer: Medicare HMO | Admitting: Physician Assistant

## 2022-03-10 DIAGNOSIS — M1711 Unilateral primary osteoarthritis, right knee: Secondary | ICD-10-CM | POA: Diagnosis not present

## 2022-03-10 DIAGNOSIS — D508 Other iron deficiency anemias: Secondary | ICD-10-CM | POA: Diagnosis not present

## 2022-03-10 DIAGNOSIS — D638 Anemia in other chronic diseases classified elsewhere: Secondary | ICD-10-CM | POA: Diagnosis not present

## 2022-03-10 DIAGNOSIS — I10 Essential (primary) hypertension: Secondary | ICD-10-CM | POA: Diagnosis not present

## 2022-03-10 DIAGNOSIS — G43009 Migraine without aura, not intractable, without status migrainosus: Secondary | ICD-10-CM | POA: Diagnosis not present

## 2022-03-10 DIAGNOSIS — F3341 Major depressive disorder, recurrent, in partial remission: Secondary | ICD-10-CM | POA: Diagnosis not present

## 2022-03-10 DIAGNOSIS — M170 Bilateral primary osteoarthritis of knee: Secondary | ICD-10-CM | POA: Diagnosis not present

## 2022-03-10 DIAGNOSIS — M797 Fibromyalgia: Secondary | ICD-10-CM | POA: Diagnosis not present

## 2022-03-10 DIAGNOSIS — E782 Mixed hyperlipidemia: Secondary | ICD-10-CM | POA: Diagnosis not present

## 2022-03-22 DIAGNOSIS — M961 Postlaminectomy syndrome, not elsewhere classified: Secondary | ICD-10-CM | POA: Diagnosis not present

## 2022-03-22 DIAGNOSIS — G8929 Other chronic pain: Secondary | ICD-10-CM | POA: Diagnosis not present

## 2022-03-22 DIAGNOSIS — M7918 Myalgia, other site: Secondary | ICD-10-CM | POA: Diagnosis not present

## 2022-03-22 DIAGNOSIS — Z950 Presence of cardiac pacemaker: Secondary | ICD-10-CM | POA: Diagnosis not present

## 2022-04-01 DIAGNOSIS — F419 Anxiety disorder, unspecified: Secondary | ICD-10-CM | POA: Diagnosis not present

## 2022-04-02 DIAGNOSIS — I10 Essential (primary) hypertension: Secondary | ICD-10-CM | POA: Diagnosis not present

## 2022-04-13 DIAGNOSIS — J302 Other seasonal allergic rhinitis: Secondary | ICD-10-CM | POA: Diagnosis not present

## 2022-04-13 DIAGNOSIS — E782 Mixed hyperlipidemia: Secondary | ICD-10-CM | POA: Diagnosis not present

## 2022-04-13 DIAGNOSIS — Z5181 Encounter for therapeutic drug level monitoring: Secondary | ICD-10-CM | POA: Diagnosis not present

## 2022-04-13 DIAGNOSIS — E559 Vitamin D deficiency, unspecified: Secondary | ICD-10-CM | POA: Diagnosis not present

## 2022-04-13 DIAGNOSIS — M5137 Other intervertebral disc degeneration, lumbosacral region: Secondary | ICD-10-CM | POA: Diagnosis not present

## 2022-04-13 DIAGNOSIS — K219 Gastro-esophageal reflux disease without esophagitis: Secondary | ICD-10-CM | POA: Diagnosis not present

## 2022-04-13 DIAGNOSIS — I10 Essential (primary) hypertension: Secondary | ICD-10-CM | POA: Diagnosis not present

## 2022-04-13 DIAGNOSIS — G43009 Migraine without aura, not intractable, without status migrainosus: Secondary | ICD-10-CM | POA: Diagnosis not present

## 2022-04-13 DIAGNOSIS — G4700 Insomnia, unspecified: Secondary | ICD-10-CM | POA: Diagnosis not present

## 2022-04-13 NOTE — Progress Notes (Deleted)
04/13/2022 [name] [id number] Feb 14, 1955  Referring provider: Hortencia Conradi, NP Primary GI doctor: Dr. Marina Goodell  ASSESSMENT AND PLAN:   There are no diagnoses linked to this encounter.   Patient Care Team: Hortencia Conradi, NP as PCP - General Thomasene Ripple, DO as PCP - Cardiology (Cardiology) Weston Settle, MD as Consulting Physician (Oncology)  HISTORY OF PRESENT ILLNESS: 67 y.o. female with a past medical history of myelodysplasia, hypertension, prior stroke, recurrent DVT on Xarelto, and GERD and others listed below presents for evaluation of hernia.   March 2021 colonoscopy for diarrhea normal TI, normal random biopsies, internal hemorrhoids 05/22/2019 office visit with Dr. Marina Goodell for diarrhea, Responded well to trial of Colestid/Questran and Lomotil  Patient is been scheduled 01/13/2022 and canceled, 01/18/2022 and canceled, 02/04/2022 and canceled Labs reviewed from 01/07/2022 show Hgb 10.9, MCV 102.5, platelets 254, WBC 4.7. B12 355, iron 117, A1c 5.1, normal folate, TSH 3.01. BUN 13, creatinine 1.22, GFR 49, normal liver function Patient {Actions; denies-reports:120008} family history of colon cancer or other gastrointestinal malignancies.   She {Actions; denies-reports:120008} blood thinner use.  She {Actions; denies-reports:120008} NSAID use.  She {Actions; denies-reports:120008} ETOH use.   She {Actions; denies-reports:120008} tobacco use.  She {Actions; denies-reports:120008} drug use.    She  reports that she has never smoked. She has never used smokeless tobacco. She reports current alcohol use. She reports that she does not use drugs.  RELEVANT LABS AND IMAGING: CBC    Component Value Date/Time   WBC 4.8 12/28/2021 1416   WBC 4.2 07/09/2019 1430   RBC 3.22 (A) 12/28/2021 1416   HGB 11.0 (A) 12/28/2021 1416   HCT 33 (A) 12/28/2021 1416   PLT 227 12/28/2021 1416   MCV 105.2 (H) 07/09/2019 1430   MCH 33.4 07/09/2019 1430   MCHC 31.8  07/09/2019 1430   RDW 14.1 07/09/2019 1430   LYMPHSABS 1.3 03/06/2019 1251   MONOABS 0.3 03/06/2019 1251   EOSABS 0.0 03/06/2019 1251   BASOSABS 0.0 03/06/2019 1251   Recent Labs    08/17/21 0000 12/28/21 1416  HGB 11.9* 11.0*     CMP     Component Value Date/Time   NA 141 12/28/2021 1412   NA 140 08/17/2021 0000   K 3.9 12/28/2021 1412   CL 109 12/28/2021 1412   CO2 23 12/28/2021 1412   GLUCOSE 124 (H) 12/28/2021 1412   BUN 12 12/28/2021 1412   BUN 9 08/17/2021 0000   CREATININE 1.13 (H) 12/28/2021 1412   CALCIUM 8.8 (L) 12/28/2021 1412   PROT 7.5 12/28/2021 1412   ALBUMIN 4.0 12/28/2021 1412   AST 24 12/28/2021 1412   ALT 21 12/28/2021 1412   ALKPHOS 51 12/28/2021 1412   BILITOT 0.3 12/28/2021 1412   GFRNONAA 54 (L) 12/28/2021 1412   GFRAA >60 07/09/2019 1430      Latest Ref Rng & Units 12/28/2021    2:12 PM 08/17/2021   12:00 AM 06/29/2020   12:00 AM  Hepatic Function  Total Protein 6.5 - 8.1 g/dL 7.5     Albumin 3.5 - 5.0 g/dL 4.0  4.5     4.5   AST 15 - 41 U/L 24  35     23   ALT 0 - 44 U/L 21  34     24   Alk Phosphatase 38 - 126 U/L 51  63     72   Total Bilirubin 0.3 - 1.2 mg/dL 0.3  This result is from an external source.      Current Medications:   Current Outpatient Medications (Endocrine & Metabolic):    ibandronate (BONIVA) 150 MG tablet, 1 tablet   progesterone (PROMETRIUM) 200 MG capsule, 1 capsule  Current Outpatient Medications (Cardiovascular):    atenolol (TENORMIN) 50 MG tablet, Take 50 mg by mouth at bedtime.    cholestyramine (QUESTRAN) 4 g packet, 1 packet mixed with water or non-carbonated drink   furosemide (LASIX) 20 MG tablet, Take 1 tablet by mouth daily.   simvastatin (ZOCOR) 20 MG tablet, 1 tablet in the evening  Current Outpatient Medications (Respiratory):    fluticasone (FLONASE) 50 MCG/ACT nasal spray, Place 1 spray into both nostrils at bedtime.   Current Outpatient Medications (Analgesics):    allopurinol  (ZYLOPRIM) 100 MG tablet, Take 1 tablet by mouth daily.   HYDROcodone-acetaminophen (NORCO) 10-325 MG tablet, 1 tablet as needed   SUMAtriptan (IMITREX) 25 MG tablet, 1 tablet at least 2 hours between doses as needed  Current Outpatient Medications (Hematological):    rivaroxaban (XARELTO) 20 MG TABS tablet, Take 20 mg by mouth daily with supper.  Current Outpatient Medications (Other):    ARIPiprazole (ABILIFY) 5 MG tablet, Take 1 tablet by mouth daily.   buPROPion (WELLBUTRIN XL) 150 MG 24 hr tablet, Take 150 mg by mouth daily.   calcium carbonate (SUPER CALCIUM) 1500 (600 Ca) MG TABS tablet, 1 tablet with meals   diphenoxylate-atropine (LOMOTIL) 2.5-0.025 MG tablet, Take 1 tablet by mouth 4 (four) times daily as needed for diarrhea or loose stools.    escitalopram (LEXAPRO) 10 MG tablet, Take 10 mg by mouth daily.   esomeprazole (NEXIUM 24HR) 20 MG capsule, Take 20 mg by mouth daily before breakfast.   gabapentin (NEURONTIN) 600 MG tablet, Take 600 mg by mouth 3 (three) times daily.   methocarbamol (ROBAXIN) 500 MG tablet, Take 1 tablet (500 mg total) by mouth every 6 (six) hours as needed for muscle spasms.   omeprazole (PRILOSEC OTC) 20 MG tablet, 1 tablet 30 minutes before morning meal   polyvinyl alcohol-povidone (REFRESH) 1.4-0.6 % ophthalmic solution, Place 1-2 drops into both eyes as needed (for dry eyes).   potassium chloride (MICRO-K) 10 MEQ CR capsule, TAKE 1 CAPSULE BY MOUTH WITH FOOD DAILY   sertraline (ZOLOFT) 100 MG tablet, Take 1 tablet by mouth daily.   topiramate (TOPAMAX) 50 MG tablet, Take by mouth.   Vitamin D, Ergocalciferol, (DRISDOL) 1.25 MG (50000 UNIT) CAPS capsule, TAKE 1 CAPSULE BY MOUTH TWICE A WEEK   zolpidem (AMBIEN) 10 MG tablet, 1 tablet at bedtime as needed  Medical History:  Past Medical History:  Diagnosis Date   Anemia    Anxiety    Arthritis    "knees, elbows, back" (06/22/2015)   Bell palsy    Chronic lower back pain    Colon polyps     Complication of anesthesia    hallucinations   Depression    Diarrhea    Fibromyalgia    GERD (gastroesophageal reflux disease)    High cholesterol    History of blood transfusion 01/2014   "related to anemia"   History of kidney stones    Hypertension    Migraine headache    "2-3 times/year" (06/22/2015)   Morbid obesity (HCC)    Myelodysplasia (myelodysplastic syndrome) (HCC)    Pulmonary embolism (HCC) 01/2008; 03/2014   "2; 1"   Seasonal allergies    Stroke (HCC) 03/2014   denies residual on 06/22/2015  Tumor, thyroid 2010   x 3   Wears glasses    Allergies:  Allergies  Allergen Reactions   Shellfish-Derived Products Swelling and Anaphylaxis   Pineapple Extract Other (See Comments)   Shellfish Allergy Other (See Comments)   Pineapple Swelling and Other (See Comments)    Stomach upset     Surgical History:  She  has a past surgical history that includes Tubal ligation (Bilateral, 06/2004); Parotid gland tumor excision (1990); Endometrial ablation; Colonoscopy; Colonoscopy w/ polypectomy; Laminectomy and microdiscectomy spine (Right, 12/2008; 01/29/2009); Diagnostic laparoscopy; Bladder surgery; Joint replacement; Total knee arthroplasty (Right, 06/22/2015); Breast biopsy (Right, 1999); Biopsy thyroid (2010); Total knee arthroplasty (Right, 06/22/2015); Anterior lat lumbar fusion (N/A, 08/02/2016); Back surgery; Anterior cervical decomp/discectomy fusion (N/A, 07/11/2019); and Carpal tunnel release (Right, 07/11/2019). Family History:  Her family history includes Alzheimer's disease in an other family member; Cancer in her mother; Heart disease in an other family member; Hypertension in her father; Kidney disease in an other family member.  REVIEW OF SYSTEMS  : All other systems reviewed and negative except where noted in the History of Present Illness.  PHYSICAL EXAM: There were no vitals taken for this visit. General Appearance: Well nourished, in no apparent distress. Head:    Normocephalic and atraumatic. Eyes:  sclerae anicteric,conjunctive pink  Respiratory: Respiratory effort normal, BS equal bilaterally without rales, rhonchi, wheezing. Cardio: RRR with no MRGs. Peripheral pulses intact.  Abdomen: Soft,  {BlankSingle:19197::"Flat","Obese","Non-distended"} ,active bowel sounds. {actendernessAB:27319} tenderness {anatomy; site abdomen:5010}. {BlankMultiple:19196::"Without guarding","With guarding","Without rebound","With rebound"}. No masses. Rectal: {acrectalexam:27461} Musculoskeletal: Full ROM, {PSY - GAIT AND STATION:22860} gait. {With/Without:304960234} edema. Skin:  Dry and intact without significant lesions or rashes Neuro: Alert and  oriented x4;  No focal deficits. Psych:  Cooperative. Normal mood and affect.    Doree AlbeeAmanda R Yohan Samons, PA-C 1:55 PM

## 2022-04-18 ENCOUNTER — Ambulatory Visit: Payer: Medicare HMO | Admitting: Physician Assistant

## 2022-04-18 DIAGNOSIS — F419 Anxiety disorder, unspecified: Secondary | ICD-10-CM | POA: Diagnosis not present

## 2022-05-02 ENCOUNTER — Inpatient Hospital Stay: Payer: Medicare HMO

## 2022-05-02 ENCOUNTER — Inpatient Hospital Stay: Payer: Medicare HMO | Admitting: Oncology

## 2022-05-03 ENCOUNTER — Inpatient Hospital Stay: Payer: Medicare HMO | Admitting: Oncology

## 2022-05-03 ENCOUNTER — Inpatient Hospital Stay: Payer: Medicare HMO | Attending: Oncology

## 2022-05-03 ENCOUNTER — Other Ambulatory Visit: Payer: Self-pay | Admitting: Oncology

## 2022-05-03 VITALS — BP 152/79 | HR 72 | Temp 98.8°F | Resp 16 | Ht 62.0 in | Wt 249.3 lb

## 2022-05-03 DIAGNOSIS — G43009 Migraine without aura, not intractable, without status migrainosus: Secondary | ICD-10-CM | POA: Diagnosis not present

## 2022-05-03 DIAGNOSIS — M170 Bilateral primary osteoarthritis of knee: Secondary | ICD-10-CM | POA: Diagnosis not present

## 2022-05-03 DIAGNOSIS — M797 Fibromyalgia: Secondary | ICD-10-CM | POA: Diagnosis not present

## 2022-05-03 DIAGNOSIS — D638 Anemia in other chronic diseases classified elsewhere: Secondary | ICD-10-CM | POA: Diagnosis not present

## 2022-05-03 DIAGNOSIS — D46C Myelodysplastic syndrome with isolated del(5q) chromosomal abnormality: Secondary | ICD-10-CM | POA: Insufficient documentation

## 2022-05-03 DIAGNOSIS — D508 Other iron deficiency anemias: Secondary | ICD-10-CM | POA: Diagnosis not present

## 2022-05-03 DIAGNOSIS — I10 Essential (primary) hypertension: Secondary | ICD-10-CM | POA: Diagnosis not present

## 2022-05-03 DIAGNOSIS — F3341 Major depressive disorder, recurrent, in partial remission: Secondary | ICD-10-CM | POA: Diagnosis not present

## 2022-05-03 DIAGNOSIS — E782 Mixed hyperlipidemia: Secondary | ICD-10-CM | POA: Diagnosis not present

## 2022-05-03 DIAGNOSIS — D649 Anemia, unspecified: Secondary | ICD-10-CM | POA: Diagnosis not present

## 2022-05-03 DIAGNOSIS — M1711 Unilateral primary osteoarthritis, right knee: Secondary | ICD-10-CM | POA: Diagnosis not present

## 2022-05-03 LAB — CMP (CANCER CENTER ONLY)
ALT: 23 U/L (ref 0–44)
AST: 28 U/L (ref 15–41)
Albumin: 4 g/dL (ref 3.5–5.0)
Alkaline Phosphatase: 58 U/L (ref 38–126)
Anion gap: 11 (ref 5–15)
BUN: 11 mg/dL (ref 8–23)
CO2: 25 mmol/L (ref 22–32)
Calcium: 9.2 mg/dL (ref 8.9–10.3)
Chloride: 102 mmol/L (ref 98–111)
Creatinine: 1.17 mg/dL — ABNORMAL HIGH (ref 0.44–1.00)
GFR, Estimated: 52 mL/min — ABNORMAL LOW (ref >60)
Glucose, Bld: 114 mg/dL — ABNORMAL HIGH (ref 70–99)
Potassium: 4 mmol/L (ref 3.5–5.1)
Sodium: 138 mmol/L (ref 135–145)
Total Bilirubin: 0.5 mg/dL (ref 0.3–1.2)
Total Protein: 7.8 g/dL (ref 6.5–8.1)

## 2022-05-03 LAB — CBC AND DIFFERENTIAL
HCT: 33 — AB (ref 36–46)
Hemoglobin: 10.9 — AB (ref 12.0–16.0)
Neutrophils Absolute: 2.91
Platelets: 294 10*3/uL (ref 150–400)
WBC: 5.6

## 2022-05-03 LAB — CBC: RBC: 3.22 — AB (ref 3.87–5.11)

## 2022-05-03 LAB — FERRITIN: Ferritin: 222 ng/mL (ref 11–307)

## 2022-05-03 LAB — IRON AND TIBC
Iron: 110 ug/dL (ref 28–170)
Saturation Ratios: 30 % (ref 10.4–31.8)
TIBC: 367 ug/dL (ref 250–450)
UIBC: 257 ug/dL

## 2022-05-03 LAB — FOLATE: Folate: 6.6 ng/mL (ref >5.9)

## 2022-05-03 LAB — VITAMIN B12: Vitamin B-12: 210 pg/mL (ref 180–914)

## 2022-05-04 ENCOUNTER — Telehealth: Payer: Self-pay

## 2022-05-04 NOTE — Telephone Encounter (Signed)
Dr Melvyn Neth reviewed labs and states, "tell her all the numbers are normal". Pt notified and verbalized understanding.  Latest Reference Range & Units 05/03/22 15:01  Iron 28 - 170 ug/dL 161  UIBC ug/dL 096  TIBC 045 - 409 ug/dL 811  Saturation Ratios 10.4 - 31.8 % 30  Ferritin 11 - 307 ng/mL 222  Folate >5.9 ng/mL 6.6  Vitamin B12 180 - 914 pg/mL 210

## 2022-05-06 DIAGNOSIS — R300 Dysuria: Secondary | ICD-10-CM | POA: Diagnosis not present

## 2022-05-06 DIAGNOSIS — N3000 Acute cystitis without hematuria: Secondary | ICD-10-CM | POA: Diagnosis not present

## 2022-05-06 DIAGNOSIS — N12 Tubulo-interstitial nephritis, not specified as acute or chronic: Secondary | ICD-10-CM | POA: Diagnosis not present

## 2022-05-06 DIAGNOSIS — R350 Frequency of micturition: Secondary | ICD-10-CM | POA: Diagnosis not present

## 2022-05-10 NOTE — Progress Notes (Signed)
[facility]  [address] [place],  [place]  [address] [phone]  Clinic Day:  05/03/2022  Referring physician: Hortencia Conradi, NP  HISTORY OF PRESENT ILLNESS:  The patient is a 67 y.o. 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  This was proven per cytogenetics done on her bone marrow biopsy in April 2019.  She briefly took Revlimid.  However, due to severe diarrhea, her Revlimid was discontinued in July 2020.  Fortunately, her hemoglobin has been ideal over these past few years to where reintroduction of some form of therapy has not been necessary.  She comes in today for routine follow-up.  Overall, the patient claims to be doing well.  She denies having significant fatigue or other symptoms which concern her for complications related to her underlying myelodysplasia.    PHYSICAL EXAM:  Blood pressure (!) 152/79, pulse 72, temperature 98.8 F (37.1 C), resp. rate 16, height 5\' 2"  (1.575 m), weight 249 lb 4.8 oz (113.1 kg), SpO2 99 %. Wt Readings from Last 3 Encounters:  05/03/22 249 lb 4.8 oz (113.1 kg)  12/28/21 254 lb 14.4 oz (115.6 kg)  08/17/21 248 lb 4.8 oz (112.6 kg)   Body mass index is 45.6 kg/m. Performance status (ECOG): 1 - Symptomatic but completely ambulatory Physical Exam Constitutional:      Appearance: Normal appearance. She is not ill-appearing.  HENT:     Mouth/Throat:     Mouth: Mucous membranes are moist.     Pharynx: Oropharynx is clear. No oropharyngeal exudate or posterior oropharyngeal erythema.  Cardiovascular:     Rate and Rhythm: Normal rate and regular rhythm.     Heart sounds: No murmur heard.    No friction rub. No gallop.  Pulmonary:     Effort: Pulmonary effort is normal. No respiratory distress.     Breath sounds: Normal breath sounds. No wheezing, rhonchi or rales.  Abdominal:     General: Bowel sounds are normal. There is no distension.     Palpations: Abdomen is  soft. There is no mass.     Tenderness: There is no abdominal tenderness.  Musculoskeletal:        General: No swelling.     Right lower leg: No edema.     Left lower leg: No edema.  Lymphadenopathy:     Cervical: No cervical adenopathy.     Upper Body:     Right upper body: No supraclavicular or axillary adenopathy.     Left upper body: No supraclavicular or axillary adenopathy.     Lower Body: No right inguinal adenopathy. No left inguinal adenopathy.  Skin:    General: Skin is warm.     Coloration: Skin is not jaundiced.     Findings: No lesion or rash.  Neurological:     General: No focal deficit present.     Mental Status: She is alert and oriented to person, place, and time. Mental status is at baseline.  Psychiatric:        Mood and Affect: Mood normal.        Behavior: Behavior normal.        Thought Content: Thought content normal.    LABS:      Latest Ref Rng & Units 05/03/2022   12:00 AM 12/28/2021    2:16 PM 08/17/2021   12:00 AM  CBC  WBC  5.6     4.8     5.7  Hemoglobin 12.0 - 16.0 10.9     11.0     11.9      Hematocrit 36 - 46 33     33     37      Platelets 150 - 400 K/uL 294     227     282         This result is from an external source.       Latest Ref Rng & Units 05/03/2022    2:04 PM 12/28/2021    2:12 PM 08/17/2021   12:00 AM  CMP  Glucose 70 - 99 mg/dL 161  096    BUN 8 - 23 mg/dL 11  12  9       Creatinine 0.44 - 1.00 mg/dL 0.45  4.09  1.1      Sodium 135 - 145 mmol/L 138  141  140      Potassium 3.5 - 5.1 mmol/L 4.0  3.9  4.3      Chloride 98 - 111 mmol/L 102  109  103      CO2 22 - 32 mmol/L 25  23  28       Calcium 8.9 - 10.3 mg/dL 9.2  8.8  9.4      Total Protein 6.5 - 8.1 g/dL 7.8  7.5    Total Bilirubin 0.3 - 1.2 mg/dL 0.5  0.3    Alkaline Phos 38 - 126 U/L 58  51  63      AST 15 - 41 U/L 28  24  35      ALT 0 - 44 U/L 23  21  34         This result is from an external source.    ASSESSMENT & PLAN:  Assessment/Plan:  A 67  y.o. 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  Her hemoglobin remains at a fairly satisfactory level.  Although the patient is doing well, her hemoglobin has been gradually trending downwards over the past few visits.  Nevertheless, as it remains above 10 and she is clinically doing well, her mild anemia from her underlying myelodysplasia will continue to be followed conservatively.  I will see her back in 4 months for repeat clinical assessment. The patient understands all the plans discussed today and is in agreement with them.    Tomas Schamp Kirby Funk, MD

## 2022-05-13 DIAGNOSIS — G43009 Migraine without aura, not intractable, without status migrainosus: Secondary | ICD-10-CM | POA: Diagnosis not present

## 2022-05-13 DIAGNOSIS — G4700 Insomnia, unspecified: Secondary | ICD-10-CM | POA: Diagnosis not present

## 2022-05-13 DIAGNOSIS — K219 Gastro-esophageal reflux disease without esophagitis: Secondary | ICD-10-CM | POA: Diagnosis not present

## 2022-05-13 DIAGNOSIS — E782 Mixed hyperlipidemia: Secondary | ICD-10-CM | POA: Diagnosis not present

## 2022-05-13 DIAGNOSIS — J302 Other seasonal allergic rhinitis: Secondary | ICD-10-CM | POA: Diagnosis not present

## 2022-05-13 DIAGNOSIS — R300 Dysuria: Secondary | ICD-10-CM | POA: Diagnosis not present

## 2022-05-13 DIAGNOSIS — E559 Vitamin D deficiency, unspecified: Secondary | ICD-10-CM | POA: Diagnosis not present

## 2022-05-13 DIAGNOSIS — M5137 Other intervertebral disc degeneration, lumbosacral region: Secondary | ICD-10-CM | POA: Diagnosis not present

## 2022-05-13 DIAGNOSIS — Z5181 Encounter for therapeutic drug level monitoring: Secondary | ICD-10-CM | POA: Diagnosis not present

## 2022-05-13 DIAGNOSIS — I10 Essential (primary) hypertension: Secondary | ICD-10-CM | POA: Diagnosis not present

## 2022-05-24 DIAGNOSIS — T162XXA Foreign body in left ear, initial encounter: Secondary | ICD-10-CM | POA: Diagnosis not present

## 2022-05-24 DIAGNOSIS — H9202 Otalgia, left ear: Secondary | ICD-10-CM | POA: Diagnosis not present

## 2022-05-24 DIAGNOSIS — N390 Urinary tract infection, site not specified: Secondary | ICD-10-CM | POA: Diagnosis not present

## 2022-05-25 DIAGNOSIS — T162XXA Foreign body in left ear, initial encounter: Secondary | ICD-10-CM | POA: Diagnosis not present

## 2022-05-26 DIAGNOSIS — F419 Anxiety disorder, unspecified: Secondary | ICD-10-CM | POA: Diagnosis not present

## 2022-06-02 DIAGNOSIS — I10 Essential (primary) hypertension: Secondary | ICD-10-CM | POA: Diagnosis not present

## 2022-06-03 DIAGNOSIS — M797 Fibromyalgia: Secondary | ICD-10-CM | POA: Diagnosis not present

## 2022-06-03 DIAGNOSIS — M170 Bilateral primary osteoarthritis of knee: Secondary | ICD-10-CM | POA: Diagnosis not present

## 2022-06-03 DIAGNOSIS — F3341 Major depressive disorder, recurrent, in partial remission: Secondary | ICD-10-CM | POA: Diagnosis not present

## 2022-06-03 DIAGNOSIS — D508 Other iron deficiency anemias: Secondary | ICD-10-CM | POA: Diagnosis not present

## 2022-06-03 DIAGNOSIS — E782 Mixed hyperlipidemia: Secondary | ICD-10-CM | POA: Diagnosis not present

## 2022-06-03 DIAGNOSIS — M1711 Unilateral primary osteoarthritis, right knee: Secondary | ICD-10-CM | POA: Diagnosis not present

## 2022-06-03 DIAGNOSIS — G43009 Migraine without aura, not intractable, without status migrainosus: Secondary | ICD-10-CM | POA: Diagnosis not present

## 2022-06-03 DIAGNOSIS — I10 Essential (primary) hypertension: Secondary | ICD-10-CM | POA: Diagnosis not present

## 2022-06-03 DIAGNOSIS — D638 Anemia in other chronic diseases classified elsewhere: Secondary | ICD-10-CM | POA: Diagnosis not present

## 2022-06-13 ENCOUNTER — Other Ambulatory Visit (HOSPITAL_COMMUNITY): Payer: Self-pay | Admitting: Nurse Practitioner

## 2022-06-13 ENCOUNTER — Ambulatory Visit: Payer: Medicare HMO | Admitting: Nurse Practitioner

## 2022-06-13 DIAGNOSIS — R3129 Other microscopic hematuria: Secondary | ICD-10-CM

## 2022-06-24 ENCOUNTER — Ambulatory Visit (HOSPITAL_COMMUNITY): Payer: Medicare HMO

## 2022-07-04 ENCOUNTER — Ambulatory Visit (HOSPITAL_COMMUNITY): Admission: RE | Admit: 2022-07-04 | Payer: Medicare HMO | Source: Ambulatory Visit

## 2022-07-06 ENCOUNTER — Ambulatory Visit (HOSPITAL_COMMUNITY): Payer: Medicare HMO

## 2022-07-20 ENCOUNTER — Ambulatory Visit (HOSPITAL_COMMUNITY): Payer: Medicare HMO

## 2022-08-04 ENCOUNTER — Ambulatory Visit (HOSPITAL_COMMUNITY)
Admission: RE | Admit: 2022-08-04 | Discharge: 2022-08-04 | Disposition: A | Discharge status: Home / Self Care | Payer: Medicare HMO | Source: Ambulatory Visit | Attending: Nurse Practitioner | Admitting: Nurse Practitioner

## 2022-08-04 DIAGNOSIS — R3129 Other microscopic hematuria: Secondary | ICD-10-CM | POA: Diagnosis present

## 2022-08-04 MED ORDER — IOHEXOL 350 MG/ML SOLN
100.0000 mL | Freq: Once | INTRAVENOUS | Status: AC | PRN
  Administered 2022-08-04: 100 mL via INTRAVENOUS

## 2022-08-04 MED ORDER — IOHEXOL 350 MG/ML SOLN: 100.0000 mL | Freq: Once | INTRAVENOUS | Status: DC | PRN

## 2022-08-31 NOTE — Progress Notes (Deleted)
[facility]  [address] [place],  [place]  [address] [phone]  Clinic Day:  05/03/2022  Referring physician: Hortencia Conradi, NP  HISTORY OF PRESENT ILLNESS:  The patient is a 67 y.o. 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  This was proven per cytogenetics done on her bone marrow biopsy in April 2019.  She briefly took Revlimid.  However, due to severe diarrhea, her Revlimid was discontinued in July 2020.  Fortunately, her hemoglobin has been ideal over these past few years to where reintroduction of some form of therapy has not been necessary.  She comes in today for routine follow-up.  Overall, the patient claims to be doing well.  She denies having significant fatigue or other symptoms which concern her for complications related to her underlying myelodysplasia.    PHYSICAL EXAM:  There were no vitals taken for this visit. Wt Readings from Last 3 Encounters:  05/03/22 249 lb 4.8 oz (113.1 kg)  12/28/21 254 lb 14.4 oz (115.6 kg)  08/17/21 248 lb 4.8 oz (112.6 kg)   There is no height or weight on file to calculate BMI. Performance status (ECOG): 1 - Symptomatic but completely ambulatory Physical Exam Constitutional:      Appearance: Normal appearance. She is not ill-appearing.  HENT:     Mouth/Throat:     Mouth: Mucous membranes are moist.     Pharynx: Oropharynx is clear. No oropharyngeal exudate or posterior oropharyngeal erythema.  Cardiovascular:     Rate and Rhythm: Normal rate and regular rhythm.     Heart sounds: No murmur heard.    No friction rub. No gallop.  Pulmonary:     Effort: Pulmonary effort is normal. No respiratory distress.     Breath sounds: Normal breath sounds. No wheezing, rhonchi or rales.  Abdominal:     General: Bowel sounds are normal. There is no distension.     Palpations: Abdomen is soft. There is no mass.     Tenderness: There is no abdominal tenderness.   Musculoskeletal:        General: No swelling.     Right lower leg: No edema.     Left lower leg: No edema.  Lymphadenopathy:     Cervical: No cervical adenopathy.     Upper Body:     Right upper body: No supraclavicular or axillary adenopathy.     Left upper body: No supraclavicular or axillary adenopathy.     Lower Body: No right inguinal adenopathy. No left inguinal adenopathy.  Skin:    General: Skin is warm.     Coloration: Skin is not jaundiced.     Findings: No lesion or rash.  Neurological:     General: No focal deficit present.     Mental Status: She is alert and oriented to person, place, and time. Mental status is at baseline.  Psychiatric:        Mood and Affect: Mood normal.        Behavior: Behavior normal.        Thought Content: Thought content normal.    LABS:      Latest Ref Rng & Units 05/03/2022   12:00 AM 12/28/2021    2:16 PM 08/17/2021   12:00 AM  CBC  WBC  5.6     4.8     5.7      Hemoglobin 12.0 - 16.0 10.9     11.0     11.9  Hematocrit 36 - 46 33     33     37      Platelets 150 - 400 K/uL 294     227     282         This result is from an external source.      Latest Ref Rng & Units 08/04/2022    1:05 PM 05/03/2022    2:04 PM 12/28/2021    2:12 PM  CMP  Glucose 70 - 99 mg/dL  841  324   BUN 8 - 23 mg/dL  11  12   Creatinine 4.01 - 1.00 mg/dL 0.27  2.53  6.64   Sodium 135 - 145 mmol/L  138  141   Potassium 3.5 - 5.1 mmol/L  4.0  3.9   Chloride 98 - 111 mmol/L  102  109   CO2 22 - 32 mmol/L  25  23   Calcium 8.9 - 10.3 mg/dL  9.2  8.8   Total Protein 6.5 - 8.1 g/dL  7.8  7.5   Total Bilirubin 0.3 - 1.2 mg/dL  0.5  0.3   Alkaline Phos 38 - 126 U/L  58  51   AST 15 - 41 U/L  28  24   ALT 0 - 44 U/L  23  21    ASSESSMENT & PLAN:  Assessment/Plan:  A 67 y.o. 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  Her hemoglobin remains at a fairly satisfactory level.  Although the patient is doing well, her  hemoglobin has been gradually trending downwards over the past few visits.  Nevertheless, as it remains above 10 and she is clinically doing well, her mild anemia from her underlying myelodysplasia will continue to be followed conservatively.  I will see her back in 4 months for repeat clinical assessment. The patient understands all the plans discussed today and is in agreement with them.    Asjah Rauda Kirby Funk, MD

## 2022-09-01 ENCOUNTER — Inpatient Hospital Stay: Payer: Medicare HMO | Admitting: Oncology

## 2022-09-01 ENCOUNTER — Inpatient Hospital Stay: Payer: Medicare HMO

## 2022-09-07 ENCOUNTER — Encounter: Payer: Self-pay | Admitting: Nurse Practitioner

## 2022-09-07 ENCOUNTER — Ambulatory Visit: Payer: Medicare HMO | Admitting: Nurse Practitioner

## 2022-09-08 ENCOUNTER — Other Ambulatory Visit: Payer: Self-pay | Admitting: Oncology

## 2022-09-08 ENCOUNTER — Inpatient Hospital Stay: Payer: Medicare HMO | Attending: Oncology

## 2022-09-08 ENCOUNTER — Inpatient Hospital Stay: Payer: Medicare HMO | Admitting: Oncology

## 2022-09-08 VITALS — BP 177/80 | HR 76 | Temp 99.1°F | Resp 16 | Ht 62.0 in | Wt 255.6 lb

## 2022-09-08 DIAGNOSIS — D46C Myelodysplastic syndrome with isolated del(5q) chromosomal abnormality: Secondary | ICD-10-CM

## 2022-09-08 LAB — CBC AND DIFFERENTIAL
HCT: 30 — AB (ref 36–46)
Hemoglobin: 9.9 — AB (ref 12.0–16.0)
Neutrophils Absolute: 2.27
Platelets: 220 10*3/uL (ref 150–400)
WBC: 4.2

## 2022-09-08 LAB — CBC: RBC: 2.99 — AB (ref 3.87–5.11)

## 2022-09-08 NOTE — Progress Notes (Signed)
[facility]  [address] [place],  [place]  [address] [phone]  Clinic Day:  09/08/2022  Referring physician: Hortencia Conradi, NP  HISTORY OF PRESENT ILLNESS:  The patient is a 67 y.o. 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  This was proven per cytogenetics done on her bone marrow biopsy in April 2019.  She briefly took Revlimid.  However, due to severe diarrhea, her Revlimid was discontinued in July 2020.  Fortunately, her hemoglobin has been ideal over these past few years to where reintroduction of some form of therapy has not been necessary.  She comes in today for routine follow-up.  Overall, the patient claims to be doing okay.  However, she does complain of increased fatigue.    PHYSICAL EXAM:  Blood pressure (!) 177/80, pulse 76, temperature 99.1 F (37.3 C), resp. rate 16, height 5\' 2"  (1.575 m), weight 255 lb 9.6 oz (115.9 kg), SpO2 95%. Wt Readings from Last 3 Encounters:  09/08/22 255 lb 9.6 oz (115.9 kg)  05/03/22 249 lb 4.8 oz (113.1 kg)  12/28/21 254 lb 14.4 oz (115.6 kg)   Body mass index is 46.75 kg/m. Performance status (ECOG): 1 - Symptomatic but completely ambulatory Physical Exam Constitutional:      Appearance: Normal appearance. She is not ill-appearing.  HENT:     Mouth/Throat:     Mouth: Mucous membranes are moist.     Pharynx: Oropharynx is clear. No oropharyngeal exudate or posterior oropharyngeal erythema.  Cardiovascular:     Rate and Rhythm: Normal rate and regular rhythm.     Heart sounds: No murmur heard.    No friction rub. No gallop.  Pulmonary:     Effort: Pulmonary effort is normal. No respiratory distress.     Breath sounds: Normal breath sounds. No wheezing, rhonchi or rales.  Abdominal:     General: Bowel sounds are normal. There is no distension.     Palpations: Abdomen is soft. There is no mass.     Tenderness: There is no abdominal tenderness.   Musculoskeletal:        General: No swelling.     Right lower leg: No edema.     Left lower leg: No edema.  Lymphadenopathy:     Cervical: No cervical adenopathy.     Upper Body:     Right upper body: No supraclavicular or axillary adenopathy.     Left upper body: No supraclavicular or axillary adenopathy.     Lower Body: No right inguinal adenopathy. No left inguinal adenopathy.  Skin:    General: Skin is warm.     Coloration: Skin is not jaundiced.     Findings: No lesion or rash.  Neurological:     General: No focal deficit present.     Mental Status: She is alert and oriented to person, place, and time. Mental status is at baseline.  Psychiatric:        Mood and Affect: Mood normal.        Behavior: Behavior normal.        Thought Content: Thought content normal.    LABS:      Latest Ref Rng & Units 09/08/2022   12:00 AM 05/03/2022   12:00 AM 12/28/2021    2:16 PM  CBC  WBC  4.2     5.6     4.8      Hemoglobin 12.0 - 16.0 9.9     10.9  11.0      Hematocrit 36 - 46 30     33     33      Platelets 150 - 400 K/uL 220     294     227         This result is from an external source.      Latest Ref Rng & Units 08/04/2022    1:05 PM 05/03/2022    2:04 PM 12/28/2021    2:12 PM  CMP  Glucose 70 - 99 mg/dL  161  096   BUN 8 - 23 mg/dL  11  12   Creatinine 0.45 - 1.00 mg/dL 4.09  8.11  9.14   Sodium 135 - 145 mmol/L  138  141   Potassium 3.5 - 5.1 mmol/L  4.0  3.9   Chloride 98 - 111 mmol/L  102  109   CO2 22 - 32 mmol/L  25  23   Calcium 8.9 - 10.3 mg/dL  9.2  8.8   Total Protein 6.5 - 8.1 g/dL  7.8  7.5   Total Bilirubin 0.3 - 1.2 mg/dL  0.5  0.3   Alkaline Phos 38 - 126 U/L  58  51   AST 15 - 41 U/L  28  24   ALT 0 - 44 U/L  23  21     Latest Reference Range & Units 09/09/22 09:10  Iron 28 - 170 ug/dL 82  UIBC ug/dL 782  TIBC 956 - 213 ug/dL 086  Saturation Ratios 10.4 - 31.8 % 22  Ferritin 11 - 307 ng/mL 86  Folate >5.9 ng/mL 6.5  Vitamin B12 180 - 914  pg/mL 201   ASSESSMENT & PLAN:  Assessment/Plan:  A 67 y.o. 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  Her labs today show that her hemoglobin has now fallen below 10.  Her iron, B12, and folate levels show no evidence of a nutritional deficiency being present.  Based upon this, I am concerned his progressive anemia may be related to progressive myelodysplasia.  Based upon this, a bone marrow biopsy will be done next week to reassess her bone marrow pathology.  I will see her back 2 weeks later to go over these results, which will be used to formulate her next course of action as it pertains to her myelodysplasia management.  The patient understands all the plans discussed today and is in agreement with them.    Winfrey Chillemi Kirby Funk, MD

## 2022-09-09 ENCOUNTER — Other Ambulatory Visit: Payer: Self-pay | Admitting: Oncology

## 2022-09-09 ENCOUNTER — Telehealth: Payer: Self-pay

## 2022-09-09 DIAGNOSIS — D46C Myelodysplastic syndrome with isolated del(5q) chromosomal abnormality: Secondary | ICD-10-CM | POA: Diagnosis present

## 2022-09-09 LAB — FERRITIN: Ferritin: 86 ng/mL (ref 11–307)

## 2022-09-09 LAB — IRON AND TIBC
Iron: 82 ug/dL (ref 28–170)
Saturation Ratios: 22 % (ref 10.4–31.8)
TIBC: 365 ug/dL (ref 250–450)
UIBC: 283 ug/dL

## 2022-09-09 LAB — FOLATE: Folate: 6.5 ng/mL

## 2022-09-09 LAB — VITAMIN B12: Vitamin B-12: 201 pg/mL (ref 180–914)

## 2022-09-09 NOTE — Telephone Encounter (Signed)
Latest Reference Range & Units 09/09/22 09:10  Iron 28 - 170 ug/dL 82  UIBC ug/dL 409  TIBC 811 - 914 ug/dL 782  Saturation Ratios 10.4 - 31.8 % 22  Ferritin 11 - 307 ng/mL 86  Folate >5.9 ng/mL 6.5  Vitamin B12 180 - 914 pg/mL 201  Dr Melvyn Neth reviewed labs and states the results indicate she needs a bone marrow biopsy done.

## 2022-09-12 LAB — METHYLMALONIC ACID, SERUM: Methylmalonic Acid, Quantitative: 421 nmol/L — ABNORMAL HIGH (ref 0–378)

## 2022-09-15 ENCOUNTER — Inpatient Hospital Stay (INDEPENDENT_AMBULATORY_CARE_PROVIDER_SITE_OTHER): Payer: Medicare HMO | Admitting: Oncology

## 2022-09-15 ENCOUNTER — Other Ambulatory Visit: Payer: Self-pay | Admitting: Oncology

## 2022-09-15 ENCOUNTER — Inpatient Hospital Stay: Payer: Medicare HMO

## 2022-09-15 VITALS — BP 136/76 | HR 89 | Temp 99.0°F | Resp 16 | Ht 62.0 in | Wt 255.7 lb

## 2022-09-15 DIAGNOSIS — D46C Myelodysplastic syndrome with isolated del(5q) chromosomal abnormality: Secondary | ICD-10-CM

## 2022-09-15 LAB — CBC AND DIFFERENTIAL
HCT: 31 — AB (ref 36–46)
Hemoglobin: 10.1 — AB (ref 12.0–16.0)
Neutrophils Absolute: 2.15
Platelets: 229 10*3/uL (ref 150–400)
WBC: 4.3

## 2022-09-15 LAB — CBC: RBC: 3.12 — AB (ref 3.87–5.11)

## 2022-09-15 NOTE — Progress Notes (Signed)
BONE MARROW PROCEDURE NOTE Due to the patient's progressive anemia in the setting of known myelodysplasia (5q- syndrome), a bone marrow biopsy was done today for further evaluation.  After consenting for the procedure, the patient's left posterior superior iliac crest was topically sterilized.  Afterwards, a bone marrow core and aspirate were collected, which will be sent for flow cytometry, cytogenetics, and a myelodysplasia FISH panel.  There were no complications experienced with this procedure.

## 2022-09-19 LAB — SURGICAL PATHOLOGY

## 2022-09-20 ENCOUNTER — Telehealth: Payer: Self-pay

## 2022-09-20 NOTE — Telephone Encounter (Addendum)
Pt called to report that her body feels completely exhausted. She had rough weekend with no energy. She doesn't think it had anything to do with bone marrow biopsy. She is asking if there is anything we can do?    Dr Melvyn Neth notified of above. Dr Melvyn Neth offered for pt to come in for labs if she would like. Otherwise, we are just waiting on bone marrow results to give [place] more information.

## 2022-09-28 NOTE — Progress Notes (Deleted)
[facility]  [address] [place],  [place]  [address] [phone]  Clinic Day:  09/08/2022  Referring physician: Hortencia Conradi, NP  HISTORY OF PRESENT ILLNESS:  The patient is a 67 y.o. 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  This was proven per cytogenetics done on her bone marrow biopsy in April 2019.  She briefly took Revlimid.  However, due to severe diarrhea, her Revlimid was discontinued in July 2020.  Fortunately, her hemoglobin has been ideal over these past few years to where reintroduction of some form of therapy has not been necessary.  She comes in today for routine follow-up.  Overall, the patient claims to be doing okay.  However, she does complain of increased fatigue.    PHYSICAL EXAM:  There were no vitals taken for this visit. Wt Readings from Last 3 Encounters:  09/15/22 255 lb 11.2 oz (116 kg)  09/08/22 255 lb 9.6 oz (115.9 kg)  05/03/22 249 lb 4.8 oz (113.1 kg)   There is no height or weight on file to calculate BMI. Performance status (ECOG): 1 - Symptomatic but completely ambulatory Physical Exam Constitutional:      Appearance: Normal appearance. She is not ill-appearing.  HENT:     Mouth/Throat:     Mouth: Mucous membranes are moist.     Pharynx: Oropharynx is clear. No oropharyngeal exudate or posterior oropharyngeal erythema.  Cardiovascular:     Rate and Rhythm: Normal rate and regular rhythm.     Heart sounds: No murmur heard.    No friction rub. No gallop.  Pulmonary:     Effort: Pulmonary effort is normal. No respiratory distress.     Breath sounds: Normal breath sounds. No wheezing, rhonchi or rales.  Abdominal:     General: Bowel sounds are normal. There is no distension.     Palpations: Abdomen is soft. There is no mass.     Tenderness: There is no abdominal tenderness.  Musculoskeletal:        General: No swelling.     Right lower leg: No edema.     Left lower leg:  No edema.  Lymphadenopathy:     Cervical: No cervical adenopathy.     Upper Body:     Right upper body: No supraclavicular or axillary adenopathy.     Left upper body: No supraclavicular or axillary adenopathy.     Lower Body: No right inguinal adenopathy. No left inguinal adenopathy.  Skin:    General: Skin is warm.     Coloration: Skin is not jaundiced.     Findings: No lesion or rash.  Neurological:     General: No focal deficit present.     Mental Status: She is alert and oriented to person, place, and time. Mental status is at baseline.  Psychiatric:        Mood and Affect: Mood normal.        Behavior: Behavior normal.        Thought Content: Thought content normal.   PATHOLOGY:  Her bone marrow biopsy revealed the following: DIAGNOSIS:   BONE MARROW, ASPIRATE, CLOT, CORE:  -  Persistence of the patient's known myelodysplastic syndrome with 5q  minus, see note.   PERIPHERAL BLOOD:  -  Macrocytic anemia   Note: The clinical history of a myelodysplastic syndrome with 5q minus  is noted.  The bone marrow is mildly hypercellular for age with a  megakaryocytic hyperplasia with many monolobated/hypolobated nuclei.  There is also evidence of both erythroid and myeloid dyspoiesis.  Blasts  are not increased by morphology, IHC or flow cytometry.  The findings  are consistent with persistence of the patient's known MDS.  Conventional cytogenetics and MDS FISH are pending and will be reported  separately.  It should be noted that the specimen is available for  NexGen myeloid sequencing for further characterization of clinically  indicated (please call the flow lab to add on if necessary).   MICROSCOPIC DESCRIPTION:   PERIPHERAL BLOOD SMEAR: The peripheral blood smear and indices are reviewed revealing a macrocytic anemia.  Upon morphologic smear review other than and anisocytosis there is no significant poikilocytosis.  The neutrophils show evidence of both hyper granularity and  hyper lobation (i.e. pseudo-Pelger-Hut cells) consistent with dysplasia.  The platelet morphology is notable for prominent large and giant platelets of significant number which likely underestimates the overall MPV which is already elevated.  Circulating blasts are not identified.  Rare basophils are seen.   BONE MARROW ASPIRATE: The bone marrow aspirate smear slides contain  several cellular bone marrow spicules which are adequate for  interpretation.  Erythroid precursors: Erythroid series is present in adequate number  with extensive nuclear budding as well as prominent nuclear irregularity  consistent with dysplasia  Granulocytic precursors: The myeloid series show slight hypo-lobation as  well as hypogranularity but not as prominent as observed in the  peripheral blood, suggestive of dysplasia.  Of interest mast cells are  prominent.  Megakaryocytes: The megakaryocytes are present in adequate to increased  number many with mono lobation/hyper lobation as well as occasional  separated nuclei consistent with dysplasia.  Lymphocytes/plasma cells: Plasma cells and lymphocytes are not  increased.   TOUCH PREPARATIONS: The touch prep shows similar but suboptimal  morphology compared to the aspirate smear slides, refer to above   CLOT AND BIOPSY: The decalcified bone marrow biopsy consists of scant  heavily fragmented cores of predominantly cortical appearing bone with  hematopoietic marrow.  Even in this limited subcortical location  hematopoietic marrow shows a hypercellularity of approximately 60%.  There is evidence of megakaryocytic clustering with  monolobated/hypolobated forms.  Otherwise the hematopoietic marrow  appears show somewhat orderly maturation unremarkable morphology, refer  to aspirate description.   The clot section has limited but evaluable hematopoietic marrow with a  cellularity of approximately 40 to 50% which is mildly hypercellular for  age.  The  megakaryocytes are present in mildly increased number many  with hypolobated/monolobated forms.  The background hematopoietic marrow  is otherwise unremarkable.   IMMUNOHISTOCHEMICAL STAINS: Immunohistochemical stains were performed  about the biopsy and clot section with appropriate controls.  CD34  highlights blasts which are not increased.  CD117 highlights increased  mast cells but with overall unremarkable mature type morphology.  Immature precursors are not increased.  An E-cadherin stain highlights  the immature erythroid compartment which is slightly increased.  Myeloperoxidase stain highlights the myeloid compartment which is  overall immunohistochemically unremarkable.   FLOW CYTOMETRY: Flow cytometry identifies no increase in CD34 positive  blasts (1.6%).  There is no immunophenotypic evidence of a  lymphoproliferative disorder.   IRON STAIN: Iron stains are performed on a bone marrow aspirate or touch imprint smear and section of clot. The controls stained appropriately.        Storage Iron: Near absent histiocytic iron stores       Ring Sideroblasts: Not identified   ADDITIONAL DATA/TESTING: Conventional cytogenetics and MDS panel FISH  are pending  and will be reported separately.     LABS:      Latest Ref Rng & Units 09/15/2022   12:00 AM 09/08/2022   12:00 AM 05/03/2022   12:00 AM  CBC  WBC  4.3     4.2     5.6      Hemoglobin 12.0 - 16.0 10.1     9.9     10.9      Hematocrit 36 - 46 31     30     33      Platelets 150 - 400 K/uL 229     220     294         This result is from an external source.      Latest Ref Rng & Units 08/04/2022    1:05 PM 05/03/2022    2:04 PM 12/28/2021    2:12 PM  CMP  Glucose 70 - 99 mg/dL  811  914   BUN 8 - 23 mg/dL  11  12   Creatinine 7.82 - 1.00 mg/dL 9.56  2.13  0.86   Sodium 135 - 145 mmol/L  138  141   Potassium 3.5 - 5.1 mmol/L  4.0  3.9   Chloride 98 - 111 mmol/L  102  109   CO2 22 - 32 mmol/L  25  23   Calcium 8.9 -  10.3 mg/dL  9.2  8.8   Total Protein 6.5 - 8.1 g/dL  7.8  7.5   Total Bilirubin 0.3 - 1.2 mg/dL  0.5  0.3   Alkaline Phos 38 - 126 U/L  58  51   AST 15 - 41 U/L  28  24   ALT 0 - 44 U/L  23  21     Latest Reference Range & Units 09/09/22 09:10  Iron 28 - 170 ug/dL 82  UIBC ug/dL 578  TIBC 469 - 629 ug/dL 528  Saturation Ratios 10.4 - 31.8 % 22  Ferritin 11 - 307 ng/mL 86  Folate >5.9 ng/mL 6.5  Vitamin B12 180 - 914 pg/mL 201   ASSESSMENT & PLAN:  Assessment/Plan:  A 67 y.o. 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  Her labs today show that her hemoglobin has now fallen below 10.  Her iron, B12, and folate levels show no evidence of a nutritional deficiency being present.  Based upon this, I am concerned his progressive anemia may be related to progressive myelodysplasia.  Based upon this, a bone marrow biopsy will be done next week to reassess her bone marrow pathology.  I will see her back 2 weeks later to go over these results, which will be used to formulate her next course of action as it pertains to her myelodysplasia management.  The patient understands all the plans discussed today and is in agreement with them.    Myana Schlup Kirby Funk, MD

## 2022-09-29 ENCOUNTER — Inpatient Hospital Stay: Payer: Medicare HMO | Admitting: Oncology

## 2022-09-29 ENCOUNTER — Inpatient Hospital Stay: Payer: Medicare HMO

## 2022-09-30 ENCOUNTER — Encounter (HOSPITAL_COMMUNITY): Payer: Self-pay

## 2022-10-03 ENCOUNTER — Other Ambulatory Visit: Payer: Self-pay | Admitting: Oncology

## 2022-10-03 ENCOUNTER — Encounter: Payer: Self-pay | Admitting: Oncology

## 2022-10-03 ENCOUNTER — Inpatient Hospital Stay (INDEPENDENT_AMBULATORY_CARE_PROVIDER_SITE_OTHER): Payer: Medicare HMO | Admitting: Oncology

## 2022-10-03 ENCOUNTER — Inpatient Hospital Stay: Payer: Medicare HMO

## 2022-10-03 DIAGNOSIS — D509 Iron deficiency anemia, unspecified: Secondary | ICD-10-CM | POA: Insufficient documentation

## 2022-10-03 DIAGNOSIS — D508 Other iron deficiency anemias: Secondary | ICD-10-CM

## 2022-10-03 LAB — SURGICAL PATHOLOGY

## 2022-10-03 NOTE — Progress Notes (Signed)
[facility]  [address] [place],  [place]  [address] [phone]  Clinic Day:  10/03/2022  Referring physician: Hortencia Conradi, NP  TELEPHONE VISIT  HISTORY OF PRESENT ILLNESS:  The patient is a 67 y.o. 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  This was proven per cytogenetics done on her bone marrow biopsy in April 2019.  She briefly took Revlimid.  However, due to severe diarrhea, her Revlimid was discontinued in July 2020.  However, over these past month, there has been a progressive decline in her hemoglobin to where a bone marrow biopsy was recently done for further evaluation.  This telephone visit is to go over all the results under implications.  Overall, the patient complains of feeling fatigued on a daily basis.  She continues to deny having any overt forms of blood loss.  PHYSICAL EXAM:  DEFERRED   PATHOLOGY:  Her bone marrow biopsy revealed the following: DIAGNOSIS:   BONE MARROW, ASPIRATE, CLOT, CORE:  -  Persistence of the patient's known myelodysplastic syndrome with 5q  minus, see note.   PERIPHERAL BLOOD:  -  Macrocytic anemia   Note: The clinical history of a myelodysplastic syndrome with 5q minus  is noted.  The bone marrow is mildly hypercellular for age with a  megakaryocytic hyperplasia with many monolobated/hypolobated nuclei.  There is also evidence of both erythroid and myeloid dyspoiesis.  Blasts  are not increased by morphology, IHC or flow cytometry.  The findings  are consistent with persistence of the patient's known MDS.  Conventional cytogenetics and MDS FISH are pending and will be reported  separately.  It should be noted that the specimen is available for  NexGen myeloid sequencing for further characterization of clinically  indicated (please call the flow lab to add on if necessary).   MICROSCOPIC DESCRIPTION:   PERIPHERAL BLOOD SMEAR: The peripheral blood smear and  indices are reviewed revealing a macrocytic anemia.  Upon morphologic smear review other than and anisocytosis there is no significant poikilocytosis.  The neutrophils show evidence of both hyper granularity and hyper lobation (i.e. pseudo-Pelger-Hut cells) consistent with dysplasia.  The platelet morphology is notable for prominent large and giant platelets of significant number which likely underestimates the overall MPV which is already elevated.  Circulating blasts are not identified.  Rare basophils are seen.   BONE MARROW ASPIRATE: The bone marrow aspirate smear slides contain  several cellular bone marrow spicules which are adequate for  interpretation.  Erythroid precursors: Erythroid series is present in adequate number  with extensive nuclear budding as well as prominent nuclear irregularity  consistent with dysplasia  Granulocytic precursors: The myeloid series show slight hypo-lobation as  well as hypogranularity but not as prominent as observed in the  peripheral blood, suggestive of dysplasia.  Of interest mast cells are  prominent.  Megakaryocytes: The megakaryocytes are present in adequate to increased  number many with mono lobation/hyper lobation as well as occasional  separated nuclei consistent with dysplasia.  Lymphocytes/plasma cells: Plasma cells and lymphocytes are not  increased.   TOUCH PREPARATIONS: The touch prep shows similar but suboptimal  morphology compared to the aspirate smear slides, refer to above   CLOT AND BIOPSY: The decalcified bone marrow biopsy consists of scant  heavily fragmented cores of predominantly cortical appearing bone with  hematopoietic marrow.  Even in this limited subcortical location  hematopoietic marrow shows a hypercellularity of approximately 60%.  There is evidence of megakaryocytic  clustering with  monolobated/hypolobated forms.  Otherwise the hematopoietic marrow  appears show somewhat orderly maturation unremarkable  morphology, refer  to aspirate description.   The clot section has limited but evaluable hematopoietic marrow with a  cellularity of approximately 40 to 50% which is mildly hypercellular for  age.  The megakaryocytes are present in mildly increased number many  with hypolobated/monolobated forms.  The background hematopoietic marrow  is otherwise unremarkable.   IMMUNOHISTOCHEMICAL STAINS: Immunohistochemical stains were performed  about the biopsy and clot section with appropriate controls.  CD34  highlights blasts which are not increased.  CD117 highlights increased  mast cells but with overall unremarkable mature type morphology.  Immature precursors are not increased.  An E-cadherin stain highlights  the immature erythroid compartment which is slightly increased.  Myeloperoxidase stain highlights the myeloid compartment which is  overall immunohistochemically unremarkable.   FLOW CYTOMETRY: Flow cytometry identifies no increase in CD34 positive  blasts (1.6%).  There is no immunophenotypic evidence of a  lymphoproliferative disorder.   IRON STAIN: Iron stains are performed on a bone marrow aspirate or touch imprint smear and section of clot. The controls stained appropriately.        Storage Iron: Near absent histiocytic iron stores       Ring Sideroblasts: Not identified   ADDITIONAL DATA/TESTING: Conventional cytogenetics and MDS panel FISH  are pending and will be reported separately.   LABS:      Latest Ref Rng & Units 09/15/2022   12:00 AM 09/08/2022   12:00 AM 05/03/2022   12:00 AM  CBC  WBC  4.3     4.2     5.6      Hemoglobin 12.0 - 16.0 10.1     9.9     10.9      Hematocrit 36 - 46 31     30     33      Platelets 150 - 400 K/uL 229     220     294         This result is from an external source.      Latest Ref Rng & Units 08/04/2022    1:05 PM 05/03/2022    2:04 PM 12/28/2021    2:12 PM  CMP  Glucose 70 - 99 mg/dL  161  096   BUN 8 - 23 mg/dL  11  12    Creatinine 0.45 - 1.00 mg/dL 4.09  8.11  9.14   Sodium 135 - 145 mmol/L  138  141   Potassium 3.5 - 5.1 mmol/L  4.0  3.9   Chloride 98 - 111 mmol/L  102  109   CO2 22 - 32 mmol/L  25  23   Calcium 8.9 - 10.3 mg/dL  9.2  8.8   Total Protein 6.5 - 8.1 g/dL  7.8  7.5   Total Bilirubin 0.3 - 1.2 mg/dL  0.5  0.3   Alkaline Phos 38 - 126 U/L  58  51   AST 15 - 41 U/L  28  24   ALT 0 - 44 U/L  23  21     Latest Reference Range & Units 09/09/22 09:10  Iron 28 - 170 ug/dL 82  UIBC ug/dL 782  TIBC 956 - 213 ug/dL 086  Saturation Ratios 10.4 - 31.8 % 22  Ferritin 11 - 307 ng/mL 86  Folate >5.9 ng/mL 6.5  Vitamin B12 180 - 914 pg/mL 201   ASSESSMENT & PLAN:  Assessment/Plan:  A 67 y.o. 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  I did go over her recent bone marrow biopsy results with her, which showred that she still has low-grade myelodysplasia.  However, what was an unexpectedly finding was that she has nearly absent iron stores.  This in stark contrast to her recent iron panel, which showed no obvious evidence of iron deficiency.  Nevertheless, based upon this, I will give this patient IV iron over these next few weeks to rapidly replenish her iron stores, which will hopefully lead to an improvement in her hemoglobin.  I will see her back in 2 months for repeat clinical assessment.  If her IV iron does not lead to the improvement in her hemoglobin, the patient understands that she may need to restart her Revlimid or a similar medicine to address her low-grade myelodysplasia.  The patient understands all the plans discussed today and is in agreement with them.    Miking Usrey Kirby Funk, MD

## 2022-10-05 MED FILL — Iron Sucrose Inj 20 MG/ML (Fe Equiv): INTRAVENOUS | Qty: 10 | Status: AC

## 2022-10-06 ENCOUNTER — Inpatient Hospital Stay: Payer: Medicare HMO | Attending: Oncology

## 2022-10-06 VITALS — BP 133/74 | HR 74 | Temp 97.8°F | Resp 18 | Wt 242.0 lb

## 2022-10-06 DIAGNOSIS — D508 Other iron deficiency anemias: Secondary | ICD-10-CM

## 2022-10-06 DIAGNOSIS — Z23 Encounter for immunization: Secondary | ICD-10-CM | POA: Diagnosis not present

## 2022-10-06 DIAGNOSIS — D46C Myelodysplastic syndrome with isolated del(5q) chromosomal abnormality: Secondary | ICD-10-CM | POA: Insufficient documentation

## 2022-10-06 MED ORDER — SODIUM CHLORIDE 0.9 % IV SOLN
200.0000 mg | Freq: Once | INTRAVENOUS | Status: AC
  Administered 2022-10-06: 200 mg via INTRAVENOUS
  Filled 2022-10-06: qty 200

## 2022-10-06 MED ORDER — SODIUM CHLORIDE 0.9 % IV SOLN: Freq: Once | INTRAVENOUS | Status: AC

## 2022-10-06 NOTE — Progress Notes (Signed)
Redness noted to right upper arm where BP cuff was for follow up vitals- Patient reports itching to area- No redness or itching noted in other areas- patient denies any other symptoms- Eugenia Mcalpine RN in room to assess patient.

## 2022-10-06 NOTE — Progress Notes (Signed)
Faint redness noted to antecubital area- patient denies itching, denies feeling hot, itching, nausea, chest pain , shortness of breath or any other sign/symptoms-

## 2022-10-06 NOTE — Progress Notes (Signed)
Redness to right upper arm faded. Patient denies itching at this time- Agrees to extend after iron observation time.

## 2022-10-06 NOTE — Patient Instructions (Signed)
Iron Sucrose Injection What is this medication? IRON SUCROSE (EYE ern SOO krose) treats low levels of iron (iron deficiency anemia) in people with kidney disease. Iron is a mineral that plays an important role in making red blood cells, which carry oxygen from your lungs to the rest of your body. This medicine may be used for other purposes; ask your health care provider or pharmacist if you have questions. COMMON BRAND NAME(S): Venofer What should I tell my care team before I take this medication? They need to know if you have any of these conditions: Anemia not caused by low iron levels Heart disease High levels of iron in the blood Kidney disease Liver disease An unusual or allergic reaction to iron, other medications, foods, dyes, or preservatives Pregnant or trying to get pregnant Breastfeeding How should I use this medication? This medication is for infusion into a vein. It is given in a hospital or clinic setting. Talk to your care team about the use of this medication in children. While this medication may be prescribed for children as young as 2 years for selected conditions, precautions do apply. Overdosage: If you think you have taken too much of this medicine contact a poison control center or emergency room at once. NOTE: This medicine is only for you. Do not share this medicine with others. What if I miss a dose? Keep appointments for follow-up doses. It is important not to miss your dose. Call your care team if you are unable to keep an appointment. What may interact with this medication? Do not take this medication with any of the following: Deferoxamine Dimercaprol Other iron products This medication may also interact with the following: Chloramphenicol Deferasirox This list may not describe all possible interactions. Give your health care provider a list of all the medicines, herbs, non-prescription drugs, or dietary supplements you use. Also tell them if you smoke,  drink alcohol, or use illegal drugs. Some items may interact with your medicine. What should I watch for while using this medication? Visit your care team regularly. Tell your care team if your symptoms do not start to get better or if they get worse. You may need blood work done while you are taking this medication. You may need to follow a special diet. Talk to your care team. Foods that contain iron include: whole grains/cereals, dried fruits, beans, or peas, leafy green vegetables, and organ meats (liver, kidney). What side effects may I notice from receiving this medication? Side effects that you should report to your care team as soon as possible: Allergic reactions--skin rash, itching, hives, swelling of the face, lips, tongue, or throat Low blood pressure--dizziness, feeling faint or lightheaded, blurry vision Shortness of breath Side effects that usually do not require medical attention (report to your care team if they continue or are bothersome): Flushing Headache Joint pain Muscle pain Nausea Pain, redness, or irritation at injection site This list may not describe all possible side effects. Call your doctor for medical advice about side effects. You may report side effects to FDA at 1-800-FDA-1088. Where should I keep my medication? This medication is given in a hospital or clinic. It will not be stored at home. NOTE: This sheet is a summary. It may not cover all possible information. If you have questions about this medicine, talk to your doctor, pharmacist, or health care provider.  2024 Elsevier/Gold Standard (2022-05-27 00:00:00)

## 2022-10-07 ENCOUNTER — Encounter: Payer: Self-pay | Admitting: Oncology

## 2022-10-07 MED FILL — Iron Sucrose Inj 20 MG/ML (Fe Equiv): INTRAVENOUS | Qty: 10 | Status: AC

## 2022-10-10 ENCOUNTER — Inpatient Hospital Stay: Payer: Medicare HMO

## 2022-10-10 VITALS — BP 138/62 | HR 62 | Temp 98.3°F | Resp 18

## 2022-10-10 DIAGNOSIS — D508 Other iron deficiency anemias: Secondary | ICD-10-CM

## 2022-10-10 DIAGNOSIS — D46C Myelodysplastic syndrome with isolated del(5q) chromosomal abnormality: Secondary | ICD-10-CM | POA: Diagnosis not present

## 2022-10-10 MED ORDER — HEPARIN SOD (PORK) LOCK FLUSH 100 UNIT/ML IV SOLN: 500.0000 [IU] | Freq: Once | INTRAVENOUS | Status: DC | PRN

## 2022-10-10 MED ORDER — SODIUM CHLORIDE 0.9 % IV SOLN: Freq: Once | INTRAVENOUS | Status: AC

## 2022-10-10 MED ORDER — SODIUM CHLORIDE 0.9 % IV SOLN
200.0000 mg | Freq: Once | INTRAVENOUS | Status: AC
  Administered 2022-10-10: 200 mg via INTRAVENOUS
  Filled 2022-10-10: qty 10

## 2022-10-10 MED ORDER — SODIUM CHLORIDE 0.9% FLUSH: 10.0000 mL | Freq: Once | INTRAVENOUS | Status: DC | PRN

## 2022-10-10 NOTE — Patient Instructions (Signed)
Iron Sucrose Injection What is this medication? IRON SUCROSE (EYE ern SOO krose) treats low levels of iron (iron deficiency anemia) in people with kidney disease. Iron is a mineral that plays an important role in making red blood cells, which carry oxygen from your lungs to the rest of your body. This medicine may be used for other purposes; ask your health care provider or pharmacist if you have questions. COMMON BRAND NAME(S): Venofer What should I tell my care team before I take this medication? They need to know if you have any of these conditions: Anemia not caused by low iron levels Heart disease High levels of iron in the blood Kidney disease Liver disease An unusual or allergic reaction to iron, other medications, foods, dyes, or preservatives Pregnant or trying to get pregnant Breastfeeding How should I use this medication? This medication is for infusion into a vein. It is given in a hospital or clinic setting. Talk to your care team about the use of this medication in children. While this medication may be prescribed for children as young as 2 years for selected conditions, precautions do apply. Overdosage: If you think you have taken too much of this medicine contact a poison control center or emergency room at once. NOTE: This medicine is only for you. Do not share this medicine with others. What if I miss a dose? Keep appointments for follow-up doses. It is important not to miss your dose. Call your care team if you are unable to keep an appointment. What may interact with this medication? Do not take this medication with any of the following: Deferoxamine Dimercaprol Other iron products This medication may also interact with the following: Chloramphenicol Deferasirox This list may not describe all possible interactions. Give your health care provider a list of all the medicines, herbs, non-prescription drugs, or dietary supplements you use. Also tell them if you smoke,  drink alcohol, or use illegal drugs. Some items may interact with your medicine. What should I watch for while using this medication? Visit your care team regularly. Tell your care team if your symptoms do not start to get better or if they get worse. You may need blood work done while you are taking this medication. You may need to follow a special diet. Talk to your care team. Foods that contain iron include: whole grains/cereals, dried fruits, beans, or peas, leafy green vegetables, and organ meats (liver, kidney). What side effects may I notice from receiving this medication? Side effects that you should report to your care team as soon as possible: Allergic reactions--skin rash, itching, hives, swelling of the face, lips, tongue, or throat Low blood pressure--dizziness, feeling faint or lightheaded, blurry vision Shortness of breath Side effects that usually do not require medical attention (report to your care team if they continue or are bothersome): Flushing Headache Joint pain Muscle pain Nausea Pain, redness, or irritation at injection site This list may not describe all possible side effects. Call your doctor for medical advice about side effects. You may report side effects to FDA at 1-800-FDA-1088. Where should I keep my medication? This medication is given in a hospital or clinic. It will not be stored at home. NOTE: This sheet is a summary. It may not cover all possible information. If you have questions about this medicine, talk to your doctor, pharmacist, or health care provider.  2024 Elsevier/Gold Standard (2022-05-27 00:00:00)

## 2022-10-14 ENCOUNTER — Telehealth: Payer: Self-pay

## 2022-10-14 NOTE — Telephone Encounter (Signed)
Pt called to ask if she could still keep her infusion appt next week (10/19/2022)? I told her "Yes, as long as 24 hrs without fever and her symptoms must be improving. She will also have to wear mask.".  Pt diagnosed w/COVID on 10/12/2022, along with her spouse. I reminded her to quarantine @ home for 5 days.

## 2022-10-17 ENCOUNTER — Encounter: Payer: Self-pay | Admitting: Oncology

## 2022-10-17 MED FILL — Iron Sucrose Inj 20 MG/ML (Fe Equiv): INTRAVENOUS | Qty: 10 | Status: AC

## 2022-10-19 ENCOUNTER — Inpatient Hospital Stay: Payer: Medicare HMO

## 2022-10-21 ENCOUNTER — Ambulatory Visit: Payer: Medicare HMO

## 2022-10-24 ENCOUNTER — Encounter: Payer: Self-pay | Admitting: Oncology

## 2022-10-25 ENCOUNTER — Ambulatory Visit: Payer: Medicare HMO

## 2022-10-27 ENCOUNTER — Encounter: Payer: Self-pay | Admitting: Oncology

## 2022-10-28 ENCOUNTER — Ambulatory Visit: Payer: Medicare HMO

## 2022-10-28 ENCOUNTER — Encounter: Payer: Self-pay | Admitting: Oncology

## 2022-10-31 ENCOUNTER — Inpatient Hospital Stay: Payer: Medicare HMO

## 2022-10-31 VITALS — BP 118/52 | HR 77 | Temp 98.7°F | Wt 251.0 lb

## 2022-10-31 DIAGNOSIS — D508 Other iron deficiency anemias: Secondary | ICD-10-CM

## 2022-10-31 DIAGNOSIS — D46C Myelodysplastic syndrome with isolated del(5q) chromosomal abnormality: Secondary | ICD-10-CM | POA: Diagnosis not present

## 2022-10-31 MED ORDER — INFLUENZA VAC A&B SURF ANT ADJ 0.5 ML IM SUSY
0.5000 mL | PREFILLED_SYRINGE | Freq: Once | INTRAMUSCULAR | Status: AC
  Administered 2022-10-31: 0.5 mL via INTRAMUSCULAR
  Filled 2022-10-31: qty 0.5

## 2022-10-31 MED ORDER — SODIUM CHLORIDE 0.9 % IV SOLN: Freq: Once | INTRAVENOUS | Status: AC

## 2022-10-31 MED ORDER — IRON SUCROSE 20 MG/ML IV SOLN
200.0000 mg | Freq: Once | INTRAVENOUS | Status: AC
  Administered 2022-10-31: 200 mg via INTRAVENOUS
  Filled 2022-10-31: qty 10

## 2022-10-31 NOTE — Patient Instructions (Signed)
Iron Sucrose Injection What is this medication? IRON SUCROSE (EYE ern SOO krose) treats low levels of iron (iron deficiency anemia) in people with kidney disease. Iron is a mineral that plays an important role in making red blood cells, which carry oxygen from your lungs to the rest of your body. This medicine may be used for other purposes; ask your health care provider or pharmacist if you have questions. COMMON BRAND NAME(S): Venofer What should I tell my care team before I take this medication? They need to know if you have any of these conditions: Anemia not caused by low iron levels Heart disease High levels of iron in the blood Kidney disease Liver disease An unusual or allergic reaction to iron, other medications, foods, dyes, or preservatives Pregnant or trying to get pregnant Breastfeeding How should I use this medication? This medication is for infusion into a vein. It is given in a hospital or clinic setting. Talk to your care team about the use of this medication in children. While this medication may be prescribed for children as young as 2 years for selected conditions, precautions do apply. Overdosage: If you think you have taken too much of this medicine contact a poison control center or emergency room at once. NOTE: This medicine is only for you. Do not share this medicine with others. What if I miss a dose? Keep appointments for follow-up doses. It is important not to miss your dose. Call your care team if you are unable to keep an appointment. What may interact with this medication? Do not take this medication with any of the following: Deferoxamine Dimercaprol Other iron products This medication may also interact with the following: Chloramphenicol Deferasirox This list may not describe all possible interactions. Give your health care provider a list of all the medicines, herbs, non-prescription drugs, or dietary supplements you use. Also tell them if you smoke,  drink alcohol, or use illegal drugs. Some items may interact with your medicine. What should I watch for while using this medication? Visit your care team regularly. Tell your care team if your symptoms do not start to get better or if they get worse. You may need blood work done while you are taking this medication. You may need to follow a special diet. Talk to your care team. Foods that contain iron include: whole grains/cereals, dried fruits, beans, or peas, leafy green vegetables, and organ meats (liver, kidney). What side effects may I notice from receiving this medication? Side effects that you should report to your care team as soon as possible: Allergic reactions--skin rash, itching, hives, swelling of the face, lips, tongue, or throat Low blood pressure--dizziness, feeling faint or lightheaded, blurry vision Shortness of breath Side effects that usually do not require medical attention (report to your care team if they continue or are bothersome): Flushing Headache Joint pain Muscle pain Nausea Pain, redness, or irritation at injection site This list may not describe all possible side effects. Call your doctor for medical advice about side effects. You may report side effects to FDA at 1-800-FDA-1088. Where should I keep my medication? This medication is given in a hospital or clinic. It will not be stored at home. NOTE: This sheet is a summary. It may not cover all possible information. If you have questions about this medicine, talk to your doctor, pharmacist, or health care provider.  2024 Elsevier/Gold Standard (2022-05-27 00:00:00)

## 2022-11-02 ENCOUNTER — Inpatient Hospital Stay: Payer: Medicare HMO

## 2022-11-07 ENCOUNTER — Ambulatory Visit: Payer: Medicare HMO

## 2022-11-09 ENCOUNTER — Inpatient Hospital Stay: Payer: Medicare HMO

## 2022-11-09 ENCOUNTER — Telehealth: Payer: Self-pay | Admitting: Oncology

## 2022-11-09 NOTE — Telephone Encounter (Signed)
11/09/2022  Patient unable to make her Appt today - Transferred to [facility] to R/S

## 2022-11-10 ENCOUNTER — Encounter: Payer: Self-pay | Admitting: Oncology

## 2022-11-10 ENCOUNTER — Inpatient Hospital Stay: Payer: Medicare HMO

## 2022-11-15 ENCOUNTER — Inpatient Hospital Stay: Payer: Medicare HMO | Attending: Oncology

## 2022-11-15 VITALS — BP 110/56 | HR 74 | Temp 98.1°F | Resp 18

## 2022-11-15 DIAGNOSIS — D508 Other iron deficiency anemias: Secondary | ICD-10-CM | POA: Insufficient documentation

## 2022-11-15 MED ORDER — SODIUM CHLORIDE 0.9 % IV SOLN
Freq: Once | INTRAVENOUS | Status: DC
Start: 2022-11-15 — End: 2022-11-15

## 2022-11-15 MED ORDER — IRON SUCROSE 20 MG/ML IV SOLN
200.0000 mg | Freq: Once | INTRAVENOUS | Status: AC
  Administered 2022-11-15: 200 mg via INTRAVENOUS
  Filled 2022-11-15: qty 10

## 2022-11-15 NOTE — Patient Instructions (Signed)
Iron Sucrose Injection What is this medication? IRON SUCROSE (EYE ern SOO krose) treats low levels of iron (iron deficiency anemia) in people with kidney disease. Iron is a mineral that plays an important role in making red blood cells, which carry oxygen from your lungs to the rest of your body. This medicine may be used for other purposes; ask your health care provider or pharmacist if you have questions. COMMON BRAND NAME(S): Venofer What should I tell my care team before I take this medication? They need to know if you have any of these conditions: Anemia not caused by low iron levels Heart disease High levels of iron in the blood Kidney disease Liver disease An unusual or allergic reaction to iron, other medications, foods, dyes, or preservatives Pregnant or trying to get pregnant Breastfeeding How should I use this medication? This medication is for infusion into a vein. It is given in a hospital or clinic setting. Talk to your care team about the use of this medication in children. While this medication may be prescribed for children as young as 2 years for selected conditions, precautions do apply. Overdosage: If you think you have taken too much of this medicine contact a poison control center or emergency room at once. NOTE: This medicine is only for you. Do not share this medicine with others. What if I miss a dose? Keep appointments for follow-up doses. It is important not to miss your dose. Call your care team if you are unable to keep an appointment. What may interact with this medication? Do not take this medication with any of the following: Deferoxamine Dimercaprol Other iron products This medication may also interact with the following: Chloramphenicol Deferasirox This list may not describe all possible interactions. Give your health care provider a list of all the medicines, herbs, non-prescription drugs, or dietary supplements you use. Also tell them if you smoke,  drink alcohol, or use illegal drugs. Some items may interact with your medicine. What should I watch for while using this medication? Visit your care team regularly. Tell your care team if your symptoms do not start to get better or if they get worse. You may need blood work done while you are taking this medication. You may need to follow a special diet. Talk to your care team. Foods that contain iron include: whole grains/cereals, dried fruits, beans, or peas, leafy green vegetables, and organ meats (liver, kidney). What side effects may I notice from receiving this medication? Side effects that you should report to your care team as soon as possible: Allergic reactions--skin rash, itching, hives, swelling of the face, lips, tongue, or throat Low blood pressure--dizziness, feeling faint or lightheaded, blurry vision Shortness of breath Side effects that usually do not require medical attention (report to your care team if they continue or are bothersome): Flushing Headache Joint pain Muscle pain Nausea Pain, redness, or irritation at injection site This list may not describe all possible side effects. Call your doctor for medical advice about side effects. You may report side effects to FDA at 1-800-FDA-1088. Where should I keep my medication? This medication is given in a hospital or clinic. It will not be stored at home. NOTE: This sheet is a summary. It may not cover all possible information. If you have questions about this medicine, talk to your doctor, pharmacist, or health care provider.  2024 Elsevier/Gold Standard (2022-05-27 00:00:00)

## 2022-11-17 ENCOUNTER — Inpatient Hospital Stay: Payer: Medicare HMO

## 2022-11-17 VITALS — BP 133/64 | HR 74 | Temp 98.1°F | Resp 18 | Wt 259.2 lb

## 2022-11-17 DIAGNOSIS — D508 Other iron deficiency anemias: Secondary | ICD-10-CM

## 2022-11-17 MED ORDER — IRON SUCROSE 20 MG/ML IV SOLN
200.0000 mg | Freq: Once | INTRAVENOUS | Status: AC
  Administered 2022-11-17: 200 mg via INTRAVENOUS
  Filled 2022-11-17: qty 10

## 2022-11-17 MED ORDER — SODIUM CHLORIDE 0.9 % IV SOLN: Freq: Once | INTRAVENOUS | Status: DC

## 2022-11-17 NOTE — Patient Instructions (Signed)
Iron Sucrose Injection What is this medication? IRON SUCROSE (EYE ern SOO krose) treats low levels of iron (iron deficiency anemia) in people with kidney disease. Iron is a mineral that plays an important role in making red blood cells, which carry oxygen from your lungs to the rest of your body. This medicine may be used for other purposes; ask your health care provider or pharmacist if you have questions. COMMON BRAND NAME(S): Venofer What should I tell my care team before I take this medication? They need to know if you have any of these conditions: Anemia not caused by low iron levels Heart disease High levels of iron in the blood Kidney disease Liver disease An unusual or allergic reaction to iron, other medications, foods, dyes, or preservatives Pregnant or trying to get pregnant Breastfeeding How should I use this medication? This medication is for infusion into a vein. It is given in a hospital or clinic setting. Talk to your care team about the use of this medication in children. While this medication may be prescribed for children as young as 2 years for selected conditions, precautions do apply. Overdosage: If you think you have taken too much of this medicine contact a poison control center or emergency room at once. NOTE: This medicine is only for you. Do not share this medicine with others. What if I miss a dose? Keep appointments for follow-up doses. It is important not to miss your dose. Call your care team if you are unable to keep an appointment. What may interact with this medication? Do not take this medication with any of the following: Deferoxamine Dimercaprol Other iron products This medication may also interact with the following: Chloramphenicol Deferasirox This list may not describe all possible interactions. Give your health care provider a list of all the medicines, herbs, non-prescription drugs, or dietary supplements you use. Also tell them if you smoke,  drink alcohol, or use illegal drugs. Some items may interact with your medicine. What should I watch for while using this medication? Visit your care team regularly. Tell your care team if your symptoms do not start to get better or if they get worse. You may need blood work done while you are taking this medication. You may need to follow a special diet. Talk to your care team. Foods that contain iron include: whole grains/cereals, dried fruits, beans, or peas, leafy green vegetables, and organ meats (liver, kidney). What side effects may I notice from receiving this medication? Side effects that you should report to your care team as soon as possible: Allergic reactions--skin rash, itching, hives, swelling of the face, lips, tongue, or throat Low blood pressure--dizziness, feeling faint or lightheaded, blurry vision Shortness of breath Side effects that usually do not require medical attention (report to your care team if they continue or are bothersome): Flushing Headache Joint pain Muscle pain Nausea Pain, redness, or irritation at injection site This list may not describe all possible side effects. Call your doctor for medical advice about side effects. You may report side effects to FDA at 1-800-FDA-1088. Where should I keep my medication? This medication is given in a hospital or clinic. It will not be stored at home. NOTE: This sheet is a summary. It may not cover all possible information. If you have questions about this medicine, talk to your doctor, pharmacist, or health care provider.  2024 Elsevier/Gold Standard (2022-05-27 00:00:00)

## 2022-11-24 ENCOUNTER — Ambulatory Visit: Payer: Medicare HMO | Admitting: Nurse Practitioner

## 2022-11-24 ENCOUNTER — Telehealth: Payer: Self-pay | Admitting: Nurse Practitioner

## 2022-11-24 NOTE — Telephone Encounter (Signed)
PT has called to reschedule her appointment today. I advised her I could not do so because of the amount of cancellations and no show appointments she has had. PT is very upset and feels we are tampering with her chart and hung up. PT has been advised that if rescheduled she would be dismissed. Please advise.

## 2022-11-24 NOTE — Telephone Encounter (Signed)
Patient notified that due to 4 no-shows/same day cancellations this year.  She is not able to reschedule

## 2022-12-05 NOTE — Telephone Encounter (Signed)
Miranda with [employer] is calling in reference to allowing the PT to schedule just one more time. She can attest to the fact the she did have covid and her brother passed away. Requesting call back to discuss. [phone] ext [phone]

## 2022-12-05 NOTE — Telephone Encounter (Signed)
Upon further review of patient's chart she was  1/11 same day cancellation 9:02 for a 11:00 1/16 no show 2/2 same day cancellation 10:24 for 2:30 3/4 no show 4/15 no show 6/10 same day cancellation 10:19 for a 2:00 9/4 same day cancellation 11:43 for a 2:00 11/21 12:56 same day cancellation for 3:00   Miranda , notified that patient may not be rescheduled.

## 2022-12-14 NOTE — Progress Notes (Deleted)
[facility]  [address] [place],  [place]  [address] [phone]  Clinic Day:  10/03/2022  Referring physician: Hortencia Conradi, NP  TELEPHONE VISIT  HISTORY OF PRESENT ILLNESS:  The patient is a 67 y.o. 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  This was proven per cytogenetics done on her bone marrow biopsy in April 2019.  She briefly took Revlimid.  However, due to severe diarrhea, her Revlimid was discontinued in July 2020.  However, over these past month, there has been a progressive decline in her hemoglobin to where a bone marrow biopsy was recently done for further evaluation.  This telephone visit is to go over all the results under implications.  Overall, the patient complains of feeling fatigued on a daily basis.  She continues to deny having any overt forms of blood loss.  PHYSICAL EXAM:  DEFERRED   PATHOLOGY:  Her bone marrow biopsy revealed the following: DIAGNOSIS:   BONE MARROW, ASPIRATE, CLOT, CORE:  -  Persistence of the patient's known myelodysplastic syndrome with 5q  minus, see note.   PERIPHERAL BLOOD:  -  Macrocytic anemia   Note: The clinical history of a myelodysplastic syndrome with 5q minus  is noted.  The bone marrow is mildly hypercellular for age with a  megakaryocytic hyperplasia with many monolobated/hypolobated nuclei.  There is also evidence of both erythroid and myeloid dyspoiesis.  Blasts  are not increased by morphology, IHC or flow cytometry.  The findings  are consistent with persistence of the patient's known MDS.  Conventional cytogenetics and MDS FISH are pending and will be reported  separately.  It should be noted that the specimen is available for  N[employer] myeloid sequencing for further characterization of clinically  indicated (please call the flow lab to add on if necessary).   MICROSCOPIC DESCRIPTION:   PERIPHERAL BLOOD SMEAR: The peripheral blood smear and  indices are reviewed revealing a macrocytic anemia.  Upon morphologic smear review other than and anisocytosis there is no significant poikilocytosis.  The neutrophils show evidence of both hyper granularity and hyper lobation (i.e. pseudo-Pelger-Hut cells) consistent with dysplasia.  The platelet morphology is notable for prominent large and giant platelets of significant number which likely underestimates the overall MPV which is already elevated.  Circulating blasts are not identified.  Rare basophils are seen.   BONE MARROW ASPIRATE: The bone marrow aspirate smear slides contain  several cellular bone marrow spicules which are adequate for  interpretation.  Erythroid precursors: Erythroid series is present in adequate number  with extensive nuclear budding as well as prominent nuclear irregularity  consistent with dysplasia  Granulocytic precursors: The myeloid series show slight hypo-lobation as  well as hypogranularity but not as prominent as observed in the  peripheral blood, suggestive of dysplasia.  Of interest mast cells are  prominent.  Megakaryocytes: The megakaryocytes are present in adequate to increased  number many with mono lobation/hyper lobation as well as occasional  separated nuclei consistent with dysplasia.  Lymphocytes/plasma cells: Plasma cells and lymphocytes are not  increased.   TOUCH PREPARATIONS: The touch prep shows similar but suboptimal  morphology compared to the aspirate smear slides, refer to above   CLOT AND BIOPSY: The decalcified bone marrow biopsy consists of scant  heavily fragmented cores of predominantly cortical appearing bone with  hematopoietic marrow.  Even in this limited subcortical location  hematopoietic marrow shows a hypercellularity of approximately 60%.  There is evidence of megakaryocytic  clustering with  monolobated/hypolobated forms.  Otherwise the hematopoietic marrow  appears show somewhat orderly maturation unremarkable  morphology, refer  to aspirate description.   The clot section has limited but evaluable hematopoietic marrow with a  cellularity of approximately 40 to 50% which is mildly hypercellular for  age.  The megakaryocytes are present in mildly increased number many  with hypolobated/monolobated forms.  The background hematopoietic marrow  is otherwise unremarkable.   IMMUNOHISTOCHEMICAL STAINS: Immunohistochemical stains were performed  about the biopsy and clot section with appropriate controls.  CD34  highlights blasts which are not increased.  CD117 highlights increased  mast cells but with overall unremarkable mature type morphology.  Immature precursors are not increased.  An E-cadherin stain highlights  the immature erythroid compartment which is slightly increased.  Myeloperoxidase stain highlights the myeloid compartment which is  overall immunohistochemically unremarkable.   FLOW CYTOMETRY: Flow cytometry identifies no increase in CD34 positive  blasts (1.6%).  There is no immunophenotypic evidence of a  lymphoproliferative disorder.   IRON STAIN: Iron stains are performed on a bone marrow aspirate or touch imprint smear and section of clot. The controls stained appropriately.        Storage Iron: Near absent histiocytic iron stores       Ring Sideroblasts: Not identified   ADDITIONAL DATA/TESTING: Conventional cytogenetics and MDS panel FISH  are pending and will be reported separately.   LABS:      Latest Ref Rng & Units 09/15/2022   12:00 AM 09/08/2022   12:00 AM 05/03/2022   12:00 AM  CBC  WBC  4.3     4.2     5.6      Hemoglobin 12.0 - 16.0 10.1     9.9     10.9      Hematocrit 36 - 46 31     30     33      Platelets 150 - 400 K/uL 229     220     294         This result is from an external source.      Latest Ref Rng & Units 08/04/2022    1:05 PM 05/03/2022    2:04 PM 12/28/2021    2:12 PM  CMP  Glucose 70 - 99 mg/dL  295  621   BUN 8 - 23 mg/dL  11  12    Creatinine 3.08 - 1.00 mg/dL 6.57  8.46  9.62   Sodium 135 - 145 mmol/L  138  141   Potassium 3.5 - 5.1 mmol/L  4.0  3.9   Chloride 98 - 111 mmol/L  102  109   CO2 22 - 32 mmol/L  25  23   Calcium 8.9 - 10.3 mg/dL  9.2  8.8   Total Protein 6.5 - 8.1 g/dL  7.8  7.5   Total Bilirubin 0.3 - 1.2 mg/dL  0.5  0.3   Alkaline Phos 38 - 126 U/L  58  51   AST 15 - 41 U/L  28  24   ALT 0 - 44 U/L  23  21     Latest Reference Range & Units 09/09/22 09:10  Iron 28 - 170 ug/dL 82  UIBC ug/dL 952  TIBC 841 - 324 ug/dL 401  Saturation Ratios 10.4 - 31.8 % 22  Ferritin 11 - 307 ng/mL 86  Folate >5.9 ng/mL 6.5  Vitamin B12 180 - 914 pg/mL 201   ASSESSMENT & PLAN:  Assessment/Plan:  A 67 y.o. 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  I did go over her recent bone marrow biopsy results with her, which showred that she still has low-grade myelodysplasia.  However, what was an unexpectedly finding was that she has nearly absent iron stores.  This in stark contrast to her recent iron panel, which showed no obvious evidence of iron deficiency.  Nevertheless, based upon this, I will give this patient IV iron over these next few weeks to rapidly replenish her iron stores, which will hopefully lead to an improvement in her hemoglobin.  I will see her back in 2 months for repeat clinical assessment.  If her IV iron does not lead to the improvement in her hemoglobin, the patient understands that she may need to restart her Revlimid or a similar medicine to address her low-grade myelodysplasia.  The patient understands all the plans discussed today and is in agreement with them.    Aury Scollard Kirby Funk, MD

## 2022-12-15 ENCOUNTER — Inpatient Hospital Stay: Payer: Medicare HMO | Attending: Oncology

## 2022-12-15 ENCOUNTER — Telehealth: Payer: Self-pay

## 2022-12-15 ENCOUNTER — Other Ambulatory Visit: Payer: Self-pay | Admitting: Oncology

## 2022-12-15 ENCOUNTER — Inpatient Hospital Stay: Payer: Medicare HMO | Attending: Oncology | Admitting: Oncology

## 2022-12-15 VITALS — BP 151/80 | HR 63 | Temp 99.3°F | Resp 16 | Ht 62.0 in | Wt 246.5 lb

## 2022-12-15 DIAGNOSIS — D469 Myelodysplastic syndrome, unspecified: Secondary | ICD-10-CM | POA: Insufficient documentation

## 2022-12-15 DIAGNOSIS — Z79899 Other long term (current) drug therapy: Secondary | ICD-10-CM | POA: Insufficient documentation

## 2022-12-15 DIAGNOSIS — D508 Other iron deficiency anemias: Secondary | ICD-10-CM | POA: Diagnosis present

## 2022-12-15 DIAGNOSIS — E611 Iron deficiency: Secondary | ICD-10-CM | POA: Diagnosis not present

## 2022-12-15 DIAGNOSIS — D46C Myelodysplastic syndrome with isolated del(5q) chromosomal abnormality: Secondary | ICD-10-CM

## 2022-12-15 LAB — CBC WITH DIFFERENTIAL (CANCER CENTER ONLY)
Abs Immature Granulocytes: 0.06 10*3/uL (ref 0.00–0.07)
Basophils Absolute: 0.1 10*3/uL (ref 0.0–0.1)
Basophils Relative: 1 %
Eosinophils Absolute: 0 10*3/uL (ref 0.0–0.5)
Eosinophils Relative: 1 %
HCT: 34.6 % — ABNORMAL LOW (ref 36.0–46.0)
Hemoglobin: 11.6 g/dL — ABNORMAL LOW (ref 12.0–15.0)
Immature Granulocytes: 1 %
Lymphocytes Relative: 25 %
Lymphs Abs: 2 10*3/uL (ref 0.7–4.0)
MCH: 33.7 pg (ref 26.0–34.0)
MCHC: 33.5 g/dL (ref 30.0–36.0)
MCV: 100.6 fL — ABNORMAL HIGH (ref 80.0–100.0)
Monocytes Absolute: 0.6 10*3/uL (ref 0.1–1.0)
Monocytes Relative: 8 %
Neutro Abs: 5.2 10*3/uL (ref 1.7–7.7)
Neutrophils Relative %: 64 %
Platelet Count: 323 10*3/uL (ref 150–400)
RBC: 3.44 MIL/uL — ABNORMAL LOW (ref 3.87–5.11)
RDW: 19.8 % — ABNORMAL HIGH (ref 11.5–15.5)
WBC Count: 8 10*3/uL (ref 4.0–10.5)
nRBC: 0 % (ref 0.0–0.2)
nRBC: 0 /100{WBCs}

## 2022-12-15 LAB — IRON AND TIBC
Iron: 139 ug/dL (ref 28–170)
Saturation Ratios: 40 % — ABNORMAL HIGH (ref 10.4–31.8)
TIBC: 347 ug/dL (ref 250–450)
UIBC: 208 ug/dL

## 2022-12-15 LAB — FERRITIN: Ferritin: 215 ng/mL (ref 11–307)

## 2022-12-15 NOTE — Telephone Encounter (Addendum)
12/15/22- Pt called to get iron results as Dr Melvyn Neth instructed. Results were still in process @ 1607. Pt to call back tomorrow.   Latest Reference Range & Units 12/15/22 13:29  Iron 28 - 170 ug/dL 865  UIBC ug/dL 784  TIBC 696 - 295 ug/dL 284  Saturation Ratios 10.4 - 31.8 % 40 (H)  Ferritin 11 - 307 ng/mL 215  (H): Data is abnormally high  Dr Melvyn Neth reviewed labs, states her numbers are better. He will see her in 3 months.

## 2022-12-16 ENCOUNTER — Encounter: Payer: Self-pay | Admitting: Oncology

## 2022-12-17 ENCOUNTER — Encounter: Payer: Self-pay | Admitting: Oncology

## 2022-12-17 NOTE — Progress Notes (Signed)
[facility]  [address] [place],  [place]  [address] [phone]  Clinic Day:  12/15/2022  Referring physician: Hortencia Conradi, NP   HISTORY OF PRESENT ILLNESS:  The patient is a 67 y.o. 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  This was proven per cytogenetics done on her bone marrow biopsy in April 2019.  She briefly took Revlimid.  However, due to severe diarrhea, her Revlimid was discontinued in July 2020.  The patient has been doing well until recently when her hemoglobin began to fall again.  However, a second bone marrow biopsy revealed absent iron stores despite her iron parameters not looking particularly worrisome.  This led to her receiving IV iron recently.  She comes in today to go over her labs to determine if her anemia has improved.  Overall, the patient denies noticing any palpable improvement in her energy levels since her IV iron was given.  She denies having any overt forms of blood loss.   PHYSICAL EXAM:  Blood pressure (!) 151/80, pulse 63, temperature 99.3 F (37.4 C), temperature source Oral, resp. rate 16, height 5\' 2"  (1.575 m), weight 246 lb 8 oz (111.8 kg), SpO2 97%. Wt Readings from Last 3 Encounters:  12/15/22 246 lb 8 oz (111.8 kg)  11/17/22 259 lb 3.2 oz (117.6 kg)  10/31/22 251 lb (113.9 kg)   Body mass index is 45.09 kg/m. Performance status (ECOG): 1 - Symptomatic but completely ambulatory Physical Exam Constitutional:      Appearance: Normal appearance. She is not ill-appearing.  HENT:     Mouth/Throat:     Mouth: Mucous membranes are moist.     Pharynx: Oropharynx is clear. No oropharyngeal exudate or posterior oropharyngeal erythema.  Cardiovascular:     Rate and Rhythm: Normal rate and regular rhythm.     Heart sounds: No murmur heard.    No friction rub. No gallop.  Pulmonary:     Effort: Pulmonary effort is normal. No respiratory distress.     Breath sounds:  Normal breath sounds. No wheezing, rhonchi or rales.  Abdominal:     General: Bowel sounds are normal. There is no distension.     Palpations: Abdomen is soft. There is no mass.     Tenderness: There is no abdominal tenderness.  Musculoskeletal:        General: No swelling.     Right lower leg: No edema.     Left lower leg: No edema.  Lymphadenopathy:     Cervical: No cervical adenopathy.     Upper Body:     Right upper body: No supraclavicular or axillary adenopathy.     Left upper body: No supraclavicular or axillary adenopathy.     Lower Body: No right inguinal adenopathy. No left inguinal adenopathy.  Skin:    General: Skin is warm.     Coloration: Skin is not jaundiced.     Findings: No lesion or rash.  Neurological:     General: No focal deficit present.     Mental Status: She is alert and oriented to person, place, and time. Mental status is at baseline.  Psychiatric:        Mood and Affect: Mood normal.        Behavior: Behavior normal.        Thought Content: Thought content normal.    LABS:      Latest Ref Rng & Units 12/15/2022    1:30 PM 09/15/2022  12:00 AM 09/08/2022   12:00 AM  CBC  WBC 4.0 - 10.5 K/uL 8.0  4.3     4.2      Hemoglobin 12.0 - 15.0 g/dL 78.2  95.6     9.9      Hematocrit 36.0 - 46.0 % 34.6  31     30       Platelets 150 - 400 K/uL 323  229     220         This result is from an external source.      Latest Ref Rng & Units 08/04/2022    1:05 PM 05/03/2022    2:04 PM 12/28/2021    2:12 PM  CMP  Glucose 70 - 99 mg/dL  213  086   BUN 8 - 23 mg/dL  11  12   Creatinine 5.78 - 1.00 mg/dL 4.69  6.29  5.28   Sodium 135 - 145 mmol/L  138  141   Potassium 3.5 - 5.1 mmol/L  4.0  3.9   Chloride 98 - 111 mmol/L  102  109   CO2 22 - 32 mmol/L  25  23   Calcium 8.9 - 10.3 mg/dL  9.2  8.8   Total Protein 6.5 - 8.1 g/dL  7.8  7.5   Total Bilirubin 0.3 - 1.2 mg/dL  0.5  0.3   Alkaline Phos 38 - 126 U/L  58  51   AST 15 - 41 U/L  28  24   ALT 0 - 44  U/L  23  21     Latest Reference Range & Units 12/15/22 13:29  Iron 28 - 170 ug/dL 413  UIBC ug/dL 244  TIBC 010 - 272 ug/dL 536  Saturation Ratios 10.4 - 31.8 % 40 (H)  Ferritin 11 - 307 ng/mL 215  (H): Data is abnormally high  ASSESSMENT & PLAN:  Assessment/Plan:  A 67 y.o. 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   Her bone marrow biopsy also  showed evidence of iron deficiency.  Despite claiming not to notice an improvement in how she feels, her hemoglobin is better at 11.6 today.  Understandably, she was pleased with these results.  For now, her anemia will be followed conservatively.  I will see her back in 3 months for repeat clinical assessment.  The patient understands all the plans discussed today and is in agreement with them.      Katherene Dinino Kirby Funk, MD

## 2023-01-19 ENCOUNTER — Telehealth: Payer: Self-pay

## 2023-01-19 NOTE — Telephone Encounter (Signed)
From Dr. Melvyn Neth on 01/18/2023 @ 2131  She has low grade myelodysplasia and iron def. Her hgb in Dec 24 was 11.6. I don't know what she may be talking about, but she is free to come in to have a CBC rechecked   01/19/2023 @ 1026  Called patient, let her know what Dr. Melvyn Neth said and offered her a lab appointment.  She declined stating she would see if she has anymore episodes and if she does she will call back and schedule a lab appt.

## 2023-02-07 ENCOUNTER — Telehealth: Payer: Self-pay

## 2023-02-07 NOTE — Telephone Encounter (Signed)
 Pt saw PCP, and labs were drawn. They are sending us  a copy. She mentioned that her Hgb had fallen from 11.9 to 9.9. She has increased fatigue. She denies any signs of bleeding in urine or bowel movement. Pt said Dr told her her heart and lungs sounded good.

## 2023-02-09 ENCOUNTER — Encounter: Payer: Self-pay | Admitting: Oncology

## 2023-02-10 ENCOUNTER — Other Ambulatory Visit: Payer: Self-pay

## 2023-02-10 ENCOUNTER — Inpatient Hospital Stay: Payer: Medicare Other | Attending: Oncology

## 2023-02-10 DIAGNOSIS — Z79899 Other long term (current) drug therapy: Secondary | ICD-10-CM | POA: Insufficient documentation

## 2023-02-10 DIAGNOSIS — D508 Other iron deficiency anemias: Secondary | ICD-10-CM | POA: Diagnosis present

## 2023-02-10 DIAGNOSIS — D469 Myelodysplastic syndrome, unspecified: Secondary | ICD-10-CM | POA: Insufficient documentation

## 2023-02-10 DIAGNOSIS — D46C Myelodysplastic syndrome with isolated del(5q) chromosomal abnormality: Secondary | ICD-10-CM

## 2023-02-10 LAB — CBC WITH DIFFERENTIAL (CANCER CENTER ONLY)
Abs Immature Granulocytes: 0.04 10*3/uL (ref 0.00–0.07)
Basophils Absolute: 0.1 10*3/uL (ref 0.0–0.1)
Basophils Relative: 2 %
Eosinophils Absolute: 0.1 10*3/uL (ref 0.0–0.5)
Eosinophils Relative: 3 %
HCT: 27.8 % — ABNORMAL LOW (ref 36.0–46.0)
Hemoglobin: 8.9 g/dL — ABNORMAL LOW (ref 12.0–15.0)
Immature Granulocytes: 1 %
Lymphocytes Relative: 46 %
Lymphs Abs: 2.1 10*3/uL (ref 0.7–4.0)
MCH: 34.8 pg — ABNORMAL HIGH (ref 26.0–34.0)
MCHC: 32 g/dL (ref 30.0–36.0)
MCV: 108.6 fL — ABNORMAL HIGH (ref 80.0–100.0)
Monocytes Absolute: 0.3 10*3/uL (ref 0.1–1.0)
Monocytes Relative: 7 %
Neutro Abs: 1.9 10*3/uL (ref 1.7–7.7)
Neutrophils Relative %: 41 %
Platelet Count: 309 10*3/uL (ref 150–400)
RBC: 2.56 MIL/uL — ABNORMAL LOW (ref 3.87–5.11)
RDW: 22.5 % — ABNORMAL HIGH (ref 11.5–15.5)
WBC Count: 4.5 10*3/uL (ref 4.0–10.5)
nRBC: 0 % (ref 0.0–0.2)
nRBC: 0 /100{WBCs}

## 2023-02-10 LAB — CMP (CANCER CENTER ONLY)
ALT: 11 U/L (ref 0–44)
AST: 19 U/L (ref 15–41)
Albumin: 3.8 g/dL (ref 3.5–5.0)
Alkaline Phosphatase: 74 U/L (ref 38–126)
Anion gap: 13 (ref 5–15)
BUN: 12 mg/dL (ref 8–23)
CO2: 23 mmol/L (ref 22–32)
Calcium: 9.2 mg/dL (ref 8.9–10.3)
Chloride: 104 mmol/L (ref 98–111)
Creatinine: 1.48 mg/dL — ABNORMAL HIGH (ref 0.44–1.00)
GFR, Estimated: 39 mL/min — ABNORMAL LOW (ref >60)
Glucose, Bld: 131 mg/dL — ABNORMAL HIGH (ref 70–99)
Potassium: 4.1 mmol/L (ref 3.5–5.1)
Sodium: 140 mmol/L (ref 135–145)
Total Bilirubin: 0.3 mg/dL (ref 0.0–1.2)
Total Protein: 6.6 g/dL (ref 6.5–8.1)

## 2023-02-10 LAB — IRON AND TIBC
Iron: 121 ug/dL (ref 28–170)
Saturation Ratios: 36 % — ABNORMAL HIGH (ref 10.4–31.8)
TIBC: 335 ug/dL (ref 250–450)
UIBC: 214 ug/dL

## 2023-02-10 LAB — VITAMIN B12: Vitamin B-12: 173 pg/mL — ABNORMAL LOW (ref 180–914)

## 2023-02-10 LAB — FERRITIN: Ferritin: 211 ng/mL (ref 11–307)

## 2023-02-13 ENCOUNTER — Telehealth: Payer: Self-pay

## 2023-02-13 ENCOUNTER — Other Ambulatory Visit: Payer: Self-pay | Admitting: Oncology

## 2023-02-13 LAB — METHYLMALONIC ACID, SERUM: Methylmalonic Acid, Quantitative: 712 nmol/L — ABNORMAL HIGH (ref 0–378)

## 2023-02-13 NOTE — Telephone Encounter (Signed)
 Hgb 8.9, Hct 27.3  Latest Reference Range & Units 02/10/23 15:28 02/10/23 15:29  Iron  28 - 170 ug/dL  161  UIBC ug/dL  096  TIBC 045 - 409 ug/dL  811  Saturation Ratios 10.4 - 31.8 %  36 (H)  Ferritin 11 - 307 ng/mL  211  Vitamin B12 180 - 914 pg/mL 173 (L)   (H): Data is abnormally high (L): Data is abnormally low

## 2023-02-14 ENCOUNTER — Other Ambulatory Visit: Payer: Self-pay | Admitting: Oncology

## 2023-02-14 ENCOUNTER — Encounter: Payer: Self-pay | Admitting: Oncology

## 2023-02-14 NOTE — Telephone Encounter (Signed)
Dr Melvyn Neth: Pt is B12 deficient; I want to start her on a protracted course of B12 shots. We can do the shots here or I can write a prescription for her to take them at home. Ask her what she prefers. It would be daily x 7 days, then weekly x 4 weeks, then monthly indefinitely

## 2023-03-16 ENCOUNTER — Other Ambulatory Visit: Payer: Self-pay | Admitting: Oncology

## 2023-03-16 DIAGNOSIS — D46C Myelodysplastic syndrome with isolated del(5q) chromosomal abnormality: Secondary | ICD-10-CM

## 2023-03-16 NOTE — Progress Notes (Deleted)
 [facility]  [address] [place],  [place]  [address] [phone]  Clinic Day:  12/15/2022  Referring physician: Hortencia Conradi, NP   HISTORY OF PRESENT ILLNESS:  The patient is a 68 y.o. 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  This was proven per cytogenetics done on her bone marrow biopsy in April 2019.  She briefly took Revlimid.  However, due to severe diarrhea, her Revlimid was discontinued in July 2020.  The patient has been doing well until recently when her hemoglobin began to fall again.  However, a second bone marrow biopsy revealed absent iron stores despite her iron parameters not looking particularly worrisome.  This led to her receiving IV iron recently.  She comes in today to go over her labs to determine if her anemia has improved.  Overall, the patient denies noticing any palpable improvement in her energy levels since her IV iron was given.  She denies having any overt forms of blood loss.   PHYSICAL EXAM:  There were no vitals taken for this visit. Wt Readings from Last 3 Encounters:  12/15/22 246 lb 8 oz (111.8 kg)  11/17/22 259 lb 3.2 oz (117.6 kg)  10/31/22 251 lb (113.9 kg)   There is no height or weight on file to calculate BMI. Performance status (ECOG): 1 - Symptomatic but completely ambulatory Physical Exam Constitutional:      Appearance: Normal appearance. She is not ill-appearing.  HENT:     Mouth/Throat:     Mouth: Mucous membranes are moist.     Pharynx: Oropharynx is clear. No oropharyngeal exudate or posterior oropharyngeal erythema.  Cardiovascular:     Rate and Rhythm: Normal rate and regular rhythm.     Heart sounds: No murmur heard.    No friction rub. No gallop.  Pulmonary:     Effort: Pulmonary effort is normal. No respiratory distress.     Breath sounds: Normal breath sounds. No wheezing, rhonchi or rales.  Abdominal:     General: Bowel sounds are normal. There  is no distension.     Palpations: Abdomen is soft. There is no mass.     Tenderness: There is no abdominal tenderness.  Musculoskeletal:        General: No swelling.     Right lower leg: No edema.     Left lower leg: No edema.  Lymphadenopathy:     Cervical: No cervical adenopathy.     Upper Body:     Right upper body: No supraclavicular or axillary adenopathy.     Left upper body: No supraclavicular or axillary adenopathy.     Lower Body: No right inguinal adenopathy. No left inguinal adenopathy.  Skin:    General: Skin is warm.     Coloration: Skin is not jaundiced.     Findings: No lesion or rash.  Neurological:     General: No focal deficit present.     Mental Status: She is alert and oriented to person, place, and time. Mental status is at baseline.  Psychiatric:        Mood and Affect: Mood normal.        Behavior: Behavior normal.        Thought Content: Thought content normal.     LABS:      Latest Ref Rng & Units 02/10/2023    3:28 PM 12/15/2022    1:30 PM 09/15/2022   12:00 AM  CBC  WBC 4.0 - 10.5  K/uL 4.5  8.0  4.3      Hemoglobin 12.0 - 15.0 g/dL 8.9  16.1  09.6      Hematocrit 36.0 - 46.0 % 27.8  34.6  31      Platelets 150 - 400 K/uL 309  323  229         This result is from an external source.      Latest Ref Rng & Units 02/10/2023    3:28 PM 08/04/2022    1:05 PM 05/03/2022    2:04 PM  CMP  Glucose 70 - 99 mg/dL 045   409   BUN 8 - 23 mg/dL 12   11   Creatinine 8.11 - 1.00 mg/dL 9.14  7.82  9.56   Sodium 135 - 145 mmol/L 140   138   Potassium 3.5 - 5.1 mmol/L 4.1   4.0   Chloride 98 - 111 mmol/L 104   102   CO2 22 - 32 mmol/L 23   25   Calcium 8.9 - 10.3 mg/dL 9.2   9.2   Total Protein 6.5 - 8.1 g/dL 6.6   7.8   Total Bilirubin 0.0 - 1.2 mg/dL 0.3   0.5   Alkaline Phos 38 - 126 U/L 74   58   AST 15 - 41 U/L 19   28   ALT 0 - 44 U/L 11   23     Latest Reference Range & Units 12/15/22 13:29  Iron 28 - 170 ug/dL 213  UIBC ug/dL 086  TIBC 578 - 469  ug/dL 629  Saturation Ratios 10.4 - 31.8 % 40 (H)  Ferritin 11 - 307 ng/mL 215  (H): Data is abnormally high  ASSESSMENT & PLAN:  Assessment/Plan:  A 68 y.o. 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   Her bone marrow biopsy also  showed evidence of iron deficiency.  Despite claiming not to notice an improvement in how she feels, her hemoglobin is better at 11.6 today.  Understandably, she was pleased with these results.  For now, her anemia will be followed conservatively.  I will see her back in 3 months for repeat clinical assessment.  The patient understands all the plans discussed today and is in agreement with them.      Alexsis Kathman Kirby Funk, MD

## 2023-03-17 ENCOUNTER — Inpatient Hospital Stay: Payer: Medicare HMO

## 2023-03-17 ENCOUNTER — Inpatient Hospital Stay: Payer: Medicare HMO | Admitting: Oncology

## 2023-03-20 ENCOUNTER — Telehealth: Payer: Self-pay

## 2023-03-20 NOTE — Telephone Encounter (Signed)
 Pt called to report that she has had nose bleed the last 5 nights with occasional clots. Denies sinus pressure, nasal congestion, sneezing, watering eyes. Afebrile. No bruising on body. She feels tired. Last Hgb @ PCP (about a month ago) was 9 something.

## 2023-03-21 ENCOUNTER — Inpatient Hospital Stay

## 2023-03-21 ENCOUNTER — Telehealth: Payer: Self-pay | Admitting: Oncology

## 2023-03-21 NOTE — Telephone Encounter (Signed)
 03/21/23 Patients husband called and rescheduled all appts.Patient not feeling well today.

## 2023-03-22 ENCOUNTER — Inpatient Hospital Stay

## 2023-03-23 ENCOUNTER — Inpatient Hospital Stay: Admitting: Oncology

## 2023-03-24 ENCOUNTER — Inpatient Hospital Stay: Admitting: Oncology

## 2023-03-24 ENCOUNTER — Inpatient Hospital Stay

## 2023-03-24 ENCOUNTER — Inpatient Hospital Stay: Attending: Oncology

## 2023-03-26 ENCOUNTER — Other Ambulatory Visit: Payer: Self-pay | Admitting: Oncology

## 2023-03-26 DIAGNOSIS — D46C Myelodysplastic syndrome with isolated del(5q) chromosomal abnormality: Secondary | ICD-10-CM

## 2023-03-26 NOTE — Progress Notes (Unsigned)
 [facility]  [address] [place],  [place]  [address] [phone]  Clinic Day:  12/15/2022  Referring physician: Hortencia Conradi, NP   HISTORY OF PRESENT ILLNESS:  The patient is a 68 y.o. 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  This was proven per cytogenetics done on her bone marrow biopsy in April 2019.  She briefly took Revlimid.  However, due to severe diarrhea, her Revlimid was discontinued in July 2020.  The patient has been doing well until recently when her hemoglobin began to fall again.  However, a second bone marrow biopsy revealed absent iron stores despite her iron parameters not looking particularly worrisome.  This led to her receiving IV iron recently.  She comes in today to go over her labs to determine if her anemia has improved.  Overall, the patient denies noticing any palpable improvement in her energy levels since her IV iron was given.  She denies having any overt forms of blood loss.   PHYSICAL EXAM:  There were no vitals taken for this visit. Wt Readings from Last 3 Encounters:  12/15/22 246 lb 8 oz (111.8 kg)  11/17/22 259 lb 3.2 oz (117.6 kg)  10/31/22 251 lb (113.9 kg)   There is no height or weight on file to calculate BMI. Performance status (ECOG): 1 - Symptomatic but completely ambulatory Physical Exam Constitutional:      Appearance: Normal appearance. She is not ill-appearing.  HENT:     Mouth/Throat:     Mouth: Mucous membranes are moist.     Pharynx: Oropharynx is clear. No oropharyngeal exudate or posterior oropharyngeal erythema.  Cardiovascular:     Rate and Rhythm: Normal rate and regular rhythm.     Heart sounds: No murmur heard.    No friction rub. No gallop.  Pulmonary:     Effort: Pulmonary effort is normal. No respiratory distress.     Breath sounds: Normal breath sounds. No wheezing, rhonchi or rales.  Abdominal:     General: Bowel sounds are normal. There  is no distension.     Palpations: Abdomen is soft. There is no mass.     Tenderness: There is no abdominal tenderness.  Musculoskeletal:        General: No swelling.     Right lower leg: No edema.     Left lower leg: No edema.  Lymphadenopathy:     Cervical: No cervical adenopathy.     Upper Body:     Right upper body: No supraclavicular or axillary adenopathy.     Left upper body: No supraclavicular or axillary adenopathy.     Lower Body: No right inguinal adenopathy. No left inguinal adenopathy.  Skin:    General: Skin is warm.     Coloration: Skin is not jaundiced.     Findings: No lesion or rash.  Neurological:     General: No focal deficit present.     Mental Status: She is alert and oriented to person, place, and time. Mental status is at baseline.  Psychiatric:        Mood and Affect: Mood normal.        Behavior: Behavior normal.        Thought Content: Thought content normal.     LABS:      Latest Ref Rng & Units 02/10/2023    3:28 PM 12/15/2022    1:30 PM 09/15/2022   12:00 AM  CBC  WBC 4.0 - 10.5  K/uL 4.5  8.0  4.3      Hemoglobin 12.0 - 15.0 g/dL 8.9  16.1  09.6      Hematocrit 36.0 - 46.0 % 27.8  34.6  31      Platelets 150 - 400 K/uL 309  323  229         This result is from an external source.      Latest Ref Rng & Units 02/10/2023    3:28 PM 08/04/2022    1:05 PM 05/03/2022    2:04 PM  CMP  Glucose 70 - 99 mg/dL 045   409   BUN 8 - 23 mg/dL 12   11   Creatinine 8.11 - 1.00 mg/dL 9.14  7.82  9.56   Sodium 135 - 145 mmol/L 140   138   Potassium 3.5 - 5.1 mmol/L 4.1   4.0   Chloride 98 - 111 mmol/L 104   102   CO2 22 - 32 mmol/L 23   25   Calcium 8.9 - 10.3 mg/dL 9.2   9.2   Total Protein 6.5 - 8.1 g/dL 6.6   7.8   Total Bilirubin 0.0 - 1.2 mg/dL 0.3   0.5   Alkaline Phos 38 - 126 U/L 74   58   AST 15 - 41 U/L 19   28   ALT 0 - 44 U/L 11   23     Latest Reference Range & Units 12/15/22 13:29  Iron 28 - 170 ug/dL 213  UIBC ug/dL 086  TIBC 578 - 469  ug/dL 629  Saturation Ratios 10.4 - 31.8 % 40 (H)  Ferritin 11 - 307 ng/mL 215  (H): Data is abnormally high  ASSESSMENT & PLAN:  Assessment/Plan:  A 68 y.o. 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   Her bone marrow biopsy also  showed evidence of iron deficiency.  Despite claiming not to notice an improvement in how she feels, her hemoglobin is better at 11.6 today.  Understandably, she was pleased with these results.  For now, her anemia will be followed conservatively.  I will see her back in 3 months for repeat clinical assessment.  The patient understands all the plans discussed today and is in agreement with them.      Alexsis Kathman Kirby Funk, MD

## 2023-03-27 ENCOUNTER — Other Ambulatory Visit: Payer: Self-pay

## 2023-03-27 ENCOUNTER — Telehealth: Payer: Self-pay | Admitting: Oncology

## 2023-03-27 ENCOUNTER — Inpatient Hospital Stay: Admitting: Oncology

## 2023-03-27 ENCOUNTER — Inpatient Hospital Stay: Attending: Oncology

## 2023-03-27 ENCOUNTER — Other Ambulatory Visit: Payer: Self-pay | Admitting: Oncology

## 2023-03-27 VITALS — BP 130/76 | HR 97 | Temp 98.6°F | Resp 20 | Ht 62.0 in | Wt 247.8 lb

## 2023-03-27 DIAGNOSIS — D508 Other iron deficiency anemias: Secondary | ICD-10-CM

## 2023-03-27 DIAGNOSIS — D46C Myelodysplastic syndrome with isolated del(5q) chromosomal abnormality: Secondary | ICD-10-CM

## 2023-03-27 DIAGNOSIS — D469 Myelodysplastic syndrome, unspecified: Secondary | ICD-10-CM | POA: Insufficient documentation

## 2023-03-27 DIAGNOSIS — E538 Deficiency of other specified B group vitamins: Secondary | ICD-10-CM | POA: Diagnosis not present

## 2023-03-27 DIAGNOSIS — E611 Iron deficiency: Secondary | ICD-10-CM | POA: Insufficient documentation

## 2023-03-27 DIAGNOSIS — Z79899 Other long term (current) drug therapy: Secondary | ICD-10-CM | POA: Diagnosis not present

## 2023-03-27 LAB — CBC WITH DIFFERENTIAL (CANCER CENTER ONLY)
Abs Immature Granulocytes: 0 10*3/uL (ref 0.00–0.07)
Basophils Absolute: 0.1 10*3/uL (ref 0.0–0.1)
Basophils Relative: 2 %
Eosinophils Absolute: 0.1 10*3/uL (ref 0.0–0.5)
Eosinophils Relative: 2 %
HCT: 24.7 % — ABNORMAL LOW (ref 36.0–46.0)
Hemoglobin: 7.9 g/dL — ABNORMAL LOW (ref 12.0–15.0)
Immature Granulocytes: 0 %
Lymphocytes Relative: 35 %
Lymphs Abs: 2.5 10*3/uL (ref 0.7–4.0)
MCH: 35 pg — ABNORMAL HIGH (ref 26.0–34.0)
MCHC: 32 g/dL (ref 30.0–36.0)
MCV: 109.3 fL — ABNORMAL HIGH (ref 80.0–100.0)
Monocytes Absolute: 0.5 10*3/uL (ref 0.1–1.0)
Monocytes Relative: 7 %
Neutro Abs: 3.9 10*3/uL (ref 1.7–7.7)
Neutrophils Relative %: 54 %
Platelet Count: 390 10*3/uL (ref 150–400)
RBC: 2.26 MIL/uL — ABNORMAL LOW (ref 3.87–5.11)
RDW: 22.5 % — ABNORMAL HIGH (ref 11.5–15.5)
Smear Review: NORMAL
WBC Count: 7.1 10*3/uL (ref 4.0–10.5)
nRBC: 0 /100{WBCs}
nRBC: 0.02 % (ref 0.0–0.2)

## 2023-03-27 LAB — CMP (CANCER CENTER ONLY)
ALT: 10 U/L (ref 0–44)
AST: 19 U/L (ref 15–41)
Albumin: 4.2 g/dL (ref 3.5–5.0)
Alkaline Phosphatase: 78 U/L (ref 38–126)
Anion gap: 18 — ABNORMAL HIGH (ref 5–15)
BUN: 10 mg/dL (ref 8–23)
CO2: 20 mmol/L — ABNORMAL LOW (ref 22–32)
Calcium: 9.1 mg/dL (ref 8.9–10.3)
Chloride: 101 mmol/L (ref 98–111)
Creatinine: 1.24 mg/dL — ABNORMAL HIGH (ref 0.44–1.00)
GFR, Estimated: 48 mL/min — ABNORMAL LOW (ref >60)
Glucose, Bld: 151 mg/dL — ABNORMAL HIGH (ref 70–99)
Potassium: 3.1 mmol/L — ABNORMAL LOW (ref 3.5–5.1)
Sodium: 139 mmol/L (ref 135–145)
Total Bilirubin: 0.4 mg/dL (ref 0.0–1.2)
Total Protein: 7.2 g/dL (ref 6.5–8.1)

## 2023-03-27 LAB — IRON AND TIBC
Iron: 91 ug/dL (ref 28–170)
Saturation Ratios: 26 % (ref 10.4–31.8)
TIBC: 351 ug/dL (ref 250–450)
UIBC: 260 ug/dL

## 2023-03-27 LAB — PREPARE RBC (CROSSMATCH)

## 2023-03-27 LAB — ABO/RH: ABO/RH(D): A POS

## 2023-03-27 LAB — FERRITIN: Ferritin: 158 ng/mL (ref 11–307)

## 2023-03-27 NOTE — Telephone Encounter (Signed)
 Patient has been scheduled for follow-up visit per 03/27/23 LOS.  Pt given an appt calendar with date and time.

## 2023-03-28 ENCOUNTER — Inpatient Hospital Stay

## 2023-03-28 DIAGNOSIS — D469 Myelodysplastic syndrome, unspecified: Secondary | ICD-10-CM | POA: Diagnosis not present

## 2023-03-28 DIAGNOSIS — D46C Myelodysplastic syndrome with isolated del(5q) chromosomal abnormality: Secondary | ICD-10-CM

## 2023-03-28 MED ORDER — SODIUM CHLORIDE 0.9% IV SOLUTION
250.0000 mL | INTRAVENOUS | Status: DC
  Administered 2023-03-28: 250 mL via INTRAVENOUS

## 2023-03-28 MED ORDER — DIPHENHYDRAMINE HCL 25 MG PO CAPS
25.0000 mg | ORAL_CAPSULE | Freq: Once | ORAL | Status: AC
  Administered 2023-03-28: 25 mg via ORAL
  Filled 2023-03-28: qty 1

## 2023-03-28 MED ORDER — ACETAMINOPHEN 325 MG PO TABS
650.0000 mg | ORAL_TABLET | Freq: Once | ORAL | Status: AC
  Administered 2023-03-28: 650 mg via ORAL
  Filled 2023-03-28: qty 2

## 2023-03-28 NOTE — Patient Instructions (Signed)

## 2023-03-29 LAB — TYPE AND SCREEN
ABO/RH(D): A POS
Antibody Screen: NEGATIVE
Unit division: 0

## 2023-03-29 LAB — BPAM RBC
Blood Product Expiration Date: 202504202359
ISSUE DATE / TIME: 202503251145
Unit Type and Rh: 202504202359
Unit Type and Rh: 6200

## 2023-05-07 ENCOUNTER — Other Ambulatory Visit: Payer: Self-pay | Admitting: Oncology

## 2023-05-07 ENCOUNTER — Encounter: Payer: Self-pay | Admitting: Oncology

## 2023-05-07 DIAGNOSIS — D46C Myelodysplastic syndrome with isolated del(5q) chromosomal abnormality: Secondary | ICD-10-CM

## 2023-05-07 NOTE — Progress Notes (Deleted)
 [facility]  [address] [place],  [place]  [address] [phone]  Clinic Day:  12/15/2022  Referring physician: Verdia Glad, NP   HISTORY OF PRESENT ILLNESS:  The patient is a 68 y.o. 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  This was proven per cytogenetics done on her bone marrow biopsy in April 2019.  She briefly took Revlimid.  However, due to severe diarrhea, her Revlimid was discontinued in July 2020.  The patient has been doing well until recently when her hemoglobin began to fall again.  Her second bone marrow biopsy in September 2024 revealed the persistent presence of myelodysplasia; it also showed absent iron  stores.  This led to her receiving IV iron  recently.  She comes in today to reassess her labs.  Unfortunately, she has been feeling weaker over the past weeks.  There was a 5-day period where she had epistaxis, which eventually dissipated without any intervention.  She denies having other overt forms of blood loss.  However, she has has had fainting spells, which she attributes to her weakness.    PHYSICAL EXAM:  There were no vitals taken for this visit. Wt Readings from Last 3 Encounters:  03/27/23 247 lb 12.8 oz (112.4 kg)  12/15/22 246 lb 8 oz (111.8 kg)  11/17/22 259 lb 3.2 oz (117.6 kg)   There is no height or weight on file to calculate BMI. Performance status (ECOG): 1 - Symptomatic but completely ambulatory Physical Exam Constitutional:      Appearance: Normal appearance. She is not ill-appearing.  HENT:     Mouth/Throat:     Mouth: Mucous membranes are moist.     Pharynx: Oropharynx is clear. No oropharyngeal exudate or posterior oropharyngeal erythema.  Cardiovascular:     Rate and Rhythm: Normal rate and regular rhythm.     Heart sounds: No murmur heard.    No friction rub. No gallop.  Pulmonary:     Effort: Pulmonary effort is normal. No respiratory distress.     Breath  sounds: Normal breath sounds. No wheezing, rhonchi or rales.  Abdominal:     General: Bowel sounds are normal. There is no distension.     Palpations: Abdomen is soft. There is no mass.     Tenderness: There is no abdominal tenderness.  Musculoskeletal:        General: No swelling.     Right lower leg: No edema.     Left lower leg: No edema.  Lymphadenopathy:     Cervical: No cervical adenopathy.     Upper Body:     Right upper body: No supraclavicular or axillary adenopathy.     Left upper body: No supraclavicular or axillary adenopathy.     Lower Body: No right inguinal adenopathy. No left inguinal adenopathy.  Skin:    General: Skin is warm.     Coloration: Skin is not jaundiced.     Findings: No lesion or rash.  Neurological:     General: No focal deficit present.     Mental Status: She is alert and oriented to person, place, and time. Mental status is at baseline.  Psychiatric:        Mood and Affect: Mood normal.        Behavior: Behavior normal.        Thought Content: Thought content normal.     LABS:      Latest Ref Rng & Units 03/27/2023   10:25 AM  02/10/2023    3:28 PM 12/15/2022    1:30 PM  CBC  WBC 4.0 - 10.5 K/uL 7.1  4.5  8.0   Hemoglobin 12.0 - 15.0 g/dL 7.9  8.9  16.1   Hematocrit 36.0 - 46.0 % 24.7  27.8  34.6   Platelets 150 - 400 K/uL 390  309  323       Latest Ref Rng & Units 03/27/2023   10:25 AM 02/10/2023    3:28 PM 08/04/2022    1:05 PM  CMP  Glucose 70 - 99 mg/dL 096  045    BUN 8 - 23 mg/dL 10  12    Creatinine 4.09 - 1.00 mg/dL 8.11  9.14  7.82   Sodium 135 - 145 mmol/L 139  140    Potassium 3.5 - 5.1 mmol/L 3.1  4.1    Chloride 98 - 111 mmol/L 101  104    CO2 22 - 32 mmol/L 20  23    Calcium 8.9 - 10.3 mg/dL 9.1  9.2    Total Protein 6.5 - 8.1 g/dL 7.2  6.6    Total Bilirubin 0.0 - 1.2 mg/dL 0.4  0.3    Alkaline Phos 38 - 126 U/L 78  74    AST 15 - 41 U/L 19  19    ALT 0 - 44 U/L 10  11      Latest Reference Range & Units 03/27/23  10:25  Iron  28 - 170 ug/dL 91  UIBC ug/dL 956  TIBC 213 - 086 ug/dL 578  Saturation Ratios 10.4 - 31.8 % 26  Ferritin 11 - 307 ng/mL 158    Latest Reference Range & Units 02/10/23 15:28  Methylmalonic Acid, Quantitative 0 - 378 nmol/L 712 (H)  Vitamin B12 180 - 914 pg/mL 173 (L)  (H): Data is abnormally high (L): Data is abnormally low  ASSESSMENT & PLAN:  Assessment/Plan:  A 68 y.o. 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   Her bone marrow biopsy also showed evidence of iron  deficiency.  Over these past few weeks, the patient's hemoglobin has progressively fallen.  Recent labs have shown evidence of vitamin B12 deficiency, for which I will place her on a protracted course of vitamin B12 injections.  She will receive B12 at 1000 mcg daily x 7 days, then weekly x 4 weeks, then monthly indefinitely.  As she currently feels very weak, I will arrange for her to be transfused 1 unit of blood this week.  I will see her back in 6 weeks for repeat clinical assessment.  If her hemoglobin remains low at her next visit despite these interventions, the patient understands that I will likely start her on Revlimid for her 5q minus syndrome, in conjunction with periodic Retacrit  shots.  The patient understands all the plans discussed today and knows to contact office if she runs into any problems before next visit that require immediate clinical attention.    Thailan Sava Felicia Horde, MD

## 2023-05-08 ENCOUNTER — Inpatient Hospital Stay: Payer: Self-pay | Admitting: Oncology

## 2023-05-08 ENCOUNTER — Inpatient Hospital Stay: Payer: Self-pay

## 2023-05-10 NOTE — Progress Notes (Deleted)
 [facility]  [address] [place],  [place]  [address] [phone]  Clinic Day:  12/15/2022  Referring physician: Verdia Glad, NP   HISTORY OF PRESENT ILLNESS:  The patient is a 67 y.o. 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  This was proven per cytogenetics done on her bone marrow biopsy in April 2019.  She briefly took Revlimid.  However, due to severe diarrhea, her Revlimid was discontinued in July 2020.  The patient has been doing well until recently when her hemoglobin began to fall again.  Her second bone marrow biopsy in September 2024 revealed the persistent presence of myelodysplasia; it also showed absent iron  stores.  This led to her receiving IV iron  recently.  She comes in today to reassess her labs.  Unfortunately, she has been feeling weaker over the past weeks.  There was a 5-day period where she had epistaxis, which eventually dissipated without any intervention.  She denies having other overt forms of blood loss.  However, she has has had fainting spells, which she attributes to her weakness.    PHYSICAL EXAM:  There were no vitals taken for this visit. Wt Readings from Last 3 Encounters:  03/27/23 247 lb 12.8 oz (112.4 kg)  12/15/22 246 lb 8 oz (111.8 kg)  11/17/22 259 lb 3.2 oz (117.6 kg)   There is no height or weight on file to calculate BMI. Performance status (ECOG): 1 - Symptomatic but completely ambulatory Physical Exam Constitutional:      Appearance: Normal appearance. She is not ill-appearing.  HENT:     Mouth/Throat:     Mouth: Mucous membranes are moist.     Pharynx: Oropharynx is clear. No oropharyngeal exudate or posterior oropharyngeal erythema.  Cardiovascular:     Rate and Rhythm: Normal rate and regular rhythm.     Heart sounds: No murmur heard.    No friction rub. No gallop.  Pulmonary:     Effort: Pulmonary effort is normal. No respiratory distress.     Breath  sounds: Normal breath sounds. No wheezing, rhonchi or rales.  Abdominal:     General: Bowel sounds are normal. There is no distension.     Palpations: Abdomen is soft. There is no mass.     Tenderness: There is no abdominal tenderness.  Musculoskeletal:        General: No swelling.     Right lower leg: No edema.     Left lower leg: No edema.  Lymphadenopathy:     Cervical: No cervical adenopathy.     Upper Body:     Right upper body: No supraclavicular or axillary adenopathy.     Left upper body: No supraclavicular or axillary adenopathy.     Lower Body: No right inguinal adenopathy. No left inguinal adenopathy.  Skin:    General: Skin is warm.     Coloration: Skin is not jaundiced.     Findings: No lesion or rash.  Neurological:     General: No focal deficit present.     Mental Status: She is alert and oriented to person, place, and time. Mental status is at baseline.  Psychiatric:        Mood and Affect: Mood normal.        Behavior: Behavior normal.        Thought Content: Thought content normal.     LABS:      Latest Ref Rng & Units 03/27/2023   10:25 AM  02/10/2023    3:28 PM 12/15/2022    1:30 PM  CBC  WBC 4.0 - 10.5 K/uL 7.1  4.5  8.0   Hemoglobin 12.0 - 15.0 g/dL 7.9  8.9  16.1   Hematocrit 36.0 - 46.0 % 24.7  27.8  34.6   Platelets 150 - 400 K/uL 390  309  323       Latest Ref Rng & Units 03/27/2023   10:25 AM 02/10/2023    3:28 PM 08/04/2022    1:05 PM  CMP  Glucose 70 - 99 mg/dL 096  045    BUN 8 - 23 mg/dL 10  12    Creatinine 4.09 - 1.00 mg/dL 8.11  9.14  7.82   Sodium 135 - 145 mmol/L 139  140    Potassium 3.5 - 5.1 mmol/L 3.1  4.1    Chloride 98 - 111 mmol/L 101  104    CO2 22 - 32 mmol/L 20  23    Calcium 8.9 - 10.3 mg/dL 9.1  9.2    Total Protein 6.5 - 8.1 g/dL 7.2  6.6    Total Bilirubin 0.0 - 1.2 mg/dL 0.4  0.3    Alkaline Phos 38 - 126 U/L 78  74    AST 15 - 41 U/L 19  19    ALT 0 - 44 U/L 10  11      Latest Reference Range & Units 03/27/23  10:25  Iron  28 - 170 ug/dL 91  UIBC ug/dL 956  TIBC 213 - 086 ug/dL 578  Saturation Ratios 10.4 - 31.8 % 26  Ferritin 11 - 307 ng/mL 158    Latest Reference Range & Units 02/10/23 15:28  Methylmalonic Acid, Quantitative 0 - 378 nmol/L 712 (H)  Vitamin B12 180 - 914 pg/mL 173 (L)  (H): Data is abnormally high (L): Data is abnormally low  ASSESSMENT & PLAN:  Assessment/Plan:  A 68 y.o. 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   Her bone marrow biopsy also showed evidence of iron  deficiency.  Over these past few weeks, the patient's hemoglobin has progressively fallen.  Recent labs have shown evidence of vitamin B12 deficiency, for which I will place her on a protracted course of vitamin B12 injections.  She will receive B12 at 1000 mcg daily x 7 days, then weekly x 4 weeks, then monthly indefinitely.  As she currently feels very weak, I will arrange for her to be transfused 1 unit of blood this week.  I will see her back in 6 weeks for repeat clinical assessment.  If her hemoglobin remains low at her next visit despite these interventions, the patient understands that I will likely start her on Revlimid for her 5q minus syndrome, in conjunction with periodic Retacrit  shots.  The patient understands all the plans discussed today and knows to contact office if she runs into any problems before next visit that require immediate clinical attention.    Thailan Sava Felicia Horde, MD

## 2023-05-11 ENCOUNTER — Inpatient Hospital Stay: Payer: Self-pay | Admitting: Oncology

## 2023-05-11 ENCOUNTER — Inpatient Hospital Stay: Payer: Self-pay

## 2023-05-11 NOTE — Progress Notes (Deleted)
 [facility]  [address] [place],  [place]  [address] [phone]  Clinic Day:  12/15/2022  Referring physician: Verdia Glad, NP   HISTORY OF PRESENT ILLNESS:  The patient is a 68 y.o. 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  This was proven per cytogenetics done on her bone marrow biopsy in April 2019.  She briefly took Revlimid.  However, due to severe diarrhea, her Revlimid was discontinued in July 2020.  The patient has been doing well until recently when her hemoglobin began to fall again.  Her second bone marrow biopsy in September 2024 revealed the persistent presence of myelodysplasia; it also showed absent iron  stores.  This led to her receiving IV iron  recently.  She comes in today to reassess her labs.  Unfortunately, she has been feeling weaker over the past weeks.  There was a 5-day period where she had epistaxis, which eventually dissipated without any intervention.  She denies having other overt forms of blood loss.  However, she has has had fainting spells, which she attributes to her weakness.    PHYSICAL EXAM:  There were no vitals taken for this visit. Wt Readings from Last 3 Encounters:  03/27/23 247 lb 12.8 oz (112.4 kg)  12/15/22 246 lb 8 oz (111.8 kg)  11/17/22 259 lb 3.2 oz (117.6 kg)   There is no height or weight on file to calculate BMI. Performance status (ECOG): 1 - Symptomatic but completely ambulatory Physical Exam Constitutional:      Appearance: Normal appearance. She is not ill-appearing.  HENT:     Mouth/Throat:     Mouth: Mucous membranes are moist.     Pharynx: Oropharynx is clear. No oropharyngeal exudate or posterior oropharyngeal erythema.  Cardiovascular:     Rate and Rhythm: Normal rate and regular rhythm.     Heart sounds: No murmur heard.    No friction rub. No gallop.  Pulmonary:     Effort: Pulmonary effort is normal. No respiratory distress.     Breath  sounds: Normal breath sounds. No wheezing, rhonchi or rales.  Abdominal:     General: Bowel sounds are normal. There is no distension.     Palpations: Abdomen is soft. There is no mass.     Tenderness: There is no abdominal tenderness.  Musculoskeletal:        General: No swelling.     Right lower leg: No edema.     Left lower leg: No edema.  Lymphadenopathy:     Cervical: No cervical adenopathy.     Upper Body:     Right upper body: No supraclavicular or axillary adenopathy.     Left upper body: No supraclavicular or axillary adenopathy.     Lower Body: No right inguinal adenopathy. No left inguinal adenopathy.  Skin:    General: Skin is warm.     Coloration: Skin is not jaundiced.     Findings: No lesion or rash.  Neurological:     General: No focal deficit present.     Mental Status: She is alert and oriented to person, place, and time. Mental status is at baseline.  Psychiatric:        Mood and Affect: Mood normal.        Behavior: Behavior normal.        Thought Content: Thought content normal.    LABS:      Latest Ref Rng & Units 03/27/2023   10:25 AM 02/10/2023  3:28 PM 12/15/2022    1:30 PM  CBC  WBC 4.0 - 10.5 K/uL 7.1  4.5  8.0   Hemoglobin 12.0 - 15.0 g/dL 7.9  8.9  16.1   Hematocrit 36.0 - 46.0 % 24.7  27.8  34.6   Platelets 150 - 400 K/uL 390  309  323       Latest Ref Rng & Units 03/27/2023   10:25 AM 02/10/2023    3:28 PM 08/04/2022    1:05 PM  CMP  Glucose 70 - 99 mg/dL 096  045    BUN 8 - 23 mg/dL 10  12    Creatinine 4.09 - 1.00 mg/dL 8.11  9.14  7.82   Sodium 135 - 145 mmol/L 139  140    Potassium 3.5 - 5.1 mmol/L 3.1  4.1    Chloride 98 - 111 mmol/L 101  104    CO2 22 - 32 mmol/L 20  23    Calcium 8.9 - 10.3 mg/dL 9.1  9.2    Total Protein 6.5 - 8.1 g/dL 7.2  6.6    Total Bilirubin 0.0 - 1.2 mg/dL 0.4  0.3    Alkaline Phos 38 - 126 U/L 78  74    AST 15 - 41 U/L 19  19    ALT 0 - 44 U/L 10  11      Latest Reference Range & Units 03/27/23 10:25   Iron  28 - 170 ug/dL 91  UIBC ug/dL 956  TIBC 213 - 086 ug/dL 578  Saturation Ratios 10.4 - 31.8 % 26  Ferritin 11 - 307 ng/mL 158    Latest Reference Range & Units 02/10/23 15:28  Methylmalonic Acid, Quantitative 0 - 378 nmol/L 712 (H)  Vitamin B12 180 - 914 pg/mL 173 (L)  (H): Data is abnormally high (L): Data is abnormally low  ASSESSMENT & PLAN:  Assessment/Plan:  A 68 y.o. 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   Her bone marrow biopsy also showed evidence of iron  deficiency.  Over these past few weeks, the patient's hemoglobin has progressively fallen.  Recent labs have shown evidence of vitamin B12 deficiency, for which I will place her on a protracted course of vitamin B12 injections.  She will receive B12 at 1000 mcg daily x 7 days, then weekly x 4 weeks, then monthly indefinitely.  As she currently feels very weak, I will arrange for her to be transfused 1 unit of blood this week.  I will see her back in 6 weeks for repeat clinical assessment.  If her hemoglobin remains low at her next visit despite these interventions, the patient understands that I will likely start her on Revlimid for her 5q minus syndrome, in conjunction with periodic Retacrit  shots.  The patient understands all the plans discussed today and knows to contact office if she runs into any problems before next visit that require immediate clinical attention.    Robet Crutchfield Felicia Horde, MD

## 2023-05-12 ENCOUNTER — Ambulatory Visit: Payer: Self-pay | Admitting: Oncology

## 2023-05-12 ENCOUNTER — Telehealth: Payer: Self-pay | Admitting: Oncology

## 2023-05-12 ENCOUNTER — Other Ambulatory Visit: Payer: Self-pay

## 2023-05-12 NOTE — Telephone Encounter (Signed)
 Patient has been scheduled for follow-up visit per 05/11/23 LOS.  Pt aware of scheduled appt details.

## 2023-05-17 ENCOUNTER — Inpatient Hospital Stay

## 2023-05-17 ENCOUNTER — Encounter: Payer: Self-pay | Admitting: Oncology

## 2023-05-17 ENCOUNTER — Inpatient Hospital Stay: Attending: Oncology

## 2023-05-17 ENCOUNTER — Telehealth: Payer: Self-pay

## 2023-05-17 ENCOUNTER — Other Ambulatory Visit: Payer: Self-pay | Admitting: Oncology

## 2023-05-17 ENCOUNTER — Inpatient Hospital Stay: Admitting: Oncology

## 2023-05-17 ENCOUNTER — Other Ambulatory Visit (HOSPITAL_COMMUNITY): Payer: Self-pay

## 2023-05-17 VITALS — BP 143/78 | HR 102 | Temp 98.7°F | Resp 14 | Ht 62.0 in | Wt 251.0 lb

## 2023-05-17 DIAGNOSIS — D469 Myelodysplastic syndrome, unspecified: Secondary | ICD-10-CM | POA: Insufficient documentation

## 2023-05-17 DIAGNOSIS — Z79899 Other long term (current) drug therapy: Secondary | ICD-10-CM | POA: Insufficient documentation

## 2023-05-17 DIAGNOSIS — D46C Myelodysplastic syndrome with isolated del(5q) chromosomal abnormality: Secondary | ICD-10-CM | POA: Diagnosis not present

## 2023-05-17 DIAGNOSIS — E538 Deficiency of other specified B group vitamins: Secondary | ICD-10-CM | POA: Insufficient documentation

## 2023-05-17 LAB — IRON AND TIBC
Iron: 72 ug/dL (ref 28–170)
Saturation Ratios: 21 % (ref 10.4–31.8)
TIBC: 340 ug/dL (ref 250–450)
UIBC: 268 ug/dL

## 2023-05-17 LAB — CBC WITH DIFFERENTIAL (CANCER CENTER ONLY)
Abs Immature Granulocytes: 0.05 10*3/uL (ref 0.00–0.07)
Basophils Absolute: 0.1 10*3/uL (ref 0.0–0.1)
Basophils Relative: 2 %
Eosinophils Absolute: 0.2 10*3/uL (ref 0.0–0.5)
Eosinophils Relative: 4 %
HCT: 29.6 % — ABNORMAL LOW (ref 36.0–46.0)
Hemoglobin: 9.4 g/dL — ABNORMAL LOW (ref 12.0–15.0)
Immature Granulocytes: 1 %
Lymphocytes Relative: 39 %
Lymphs Abs: 1.8 10*3/uL (ref 0.7–4.0)
MCH: 33.2 pg (ref 26.0–34.0)
MCHC: 31.8 g/dL (ref 30.0–36.0)
MCV: 104.6 fL — ABNORMAL HIGH (ref 80.0–100.0)
Monocytes Absolute: 0.3 10*3/uL (ref 0.1–1.0)
Monocytes Relative: 7 %
Neutro Abs: 2.1 10*3/uL (ref 1.7–7.7)
Neutrophils Relative %: 47 %
Platelet Count: 321 10*3/uL (ref 150–400)
RBC: 2.83 MIL/uL — ABNORMAL LOW (ref 3.87–5.11)
RDW: 19.1 % — ABNORMAL HIGH (ref 11.5–15.5)
WBC Count: 4.6 10*3/uL (ref 4.0–10.5)
nRBC: 0 % (ref 0.0–0.2)

## 2023-05-17 LAB — VITAMIN B12: Vitamin B-12: 1409 pg/mL — ABNORMAL HIGH (ref 180–914)

## 2023-05-17 LAB — FERRITIN: Ferritin: 111 ng/mL (ref 11–307)

## 2023-05-17 LAB — FOLATE: Folate: 24.3 ng/mL (ref >5.9)

## 2023-05-17 NOTE — Progress Notes (Signed)
 [facility]  [address] [place],  [place]  [address] [phone]  Clinic Day:  05/17/2023  Referring physician: Verdia Glad, NP   HISTORY OF PRESENT ILLNESS:  The patient is a 68 y.o. 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  This was proven per cytogenetics done on her bone marrow biopsy in April 2019.  She briefly took Revlimid.  However, due to severe diarrhea, her Revlimid was discontinued in July 2020.  The patient has been doing well until recently when her hemoglobin began to fall again.  Her second bone marrow biopsy in September 2024 revealed the persistent presence of myelodysplasia; it also showed absent iron  stores.  This led to her receiving IV iron  recently.  Labs also showed evidence of B12 deficiency for which she has taken a protracted course of B12 injections.  She comes in today to reassess her labs.  Unfortunately, despite her B12 injections and IV iron  over these past months, she still feels weak on a daily basis.  She continues to deny having any overt forms of blood loss.    PHYSICAL EXAM:  Blood pressure (!) 143/78, pulse (!) 102, temperature 98.7 F (37.1 C), temperature source Oral, resp. rate 14, height 5\' 2"  (1.575 m), weight 251 lb (113.9 kg), SpO2 97%. Wt Readings from Last 3 Encounters:  05/17/23 251 lb (113.9 kg)  03/27/23 247 lb 12.8 oz (112.4 kg)  12/15/22 246 lb 8 oz (111.8 kg)   Body mass index is 45.91 kg/m. Performance status (ECOG): 1 - Symptomatic but completely ambulatory Physical Exam Constitutional:      Appearance: Normal appearance. She is not ill-appearing.  HENT:     Mouth/Throat:     Mouth: Mucous membranes are moist.     Pharynx: Oropharynx is clear. No oropharyngeal exudate or posterior oropharyngeal erythema.  Cardiovascular:     Rate and Rhythm: Normal rate and regular rhythm.     Heart sounds: No murmur heard.    No friction rub. No gallop.  Pulmonary:      Effort: Pulmonary effort is normal. No respiratory distress.     Breath sounds: Normal breath sounds. No wheezing, rhonchi or rales.  Abdominal:     General: Bowel sounds are normal. There is no distension.     Palpations: Abdomen is soft. There is no mass.     Tenderness: There is no abdominal tenderness.  Musculoskeletal:        General: No swelling.     Right lower leg: No edema.     Left lower leg: No edema.  Lymphadenopathy:     Cervical: No cervical adenopathy.     Upper Body:     Right upper body: No supraclavicular or axillary adenopathy.     Left upper body: No supraclavicular or axillary adenopathy.     Lower Body: No right inguinal adenopathy. No left inguinal adenopathy.  Skin:    General: Skin is warm.     Coloration: Skin is not jaundiced.     Findings: No lesion or rash.  Neurological:     General: No focal deficit present.     Mental Status: She is alert and oriented to person, place, and time. Mental status is at baseline.  Psychiatric:        Mood and Affect: Mood normal.        Behavior: Behavior normal.        Thought Content: Thought content normal.   LABS:  Latest Ref Rng & Units 05/17/2023    9:24 AM 03/27/2023   10:25 AM 02/10/2023    3:28 PM  CBC  WBC 4.0 - 10.5 K/uL 4.6  7.1  4.5   Hemoglobin 12.0 - 15.0 g/dL 9.4  7.9  8.9   Hematocrit 36.0 - 46.0 % 29.6  24.7  27.8   Platelets 150 - 400 K/uL 321  390  309       Latest Ref Rng & Units 03/27/2023   10:25 AM 02/10/2023    3:28 PM 08/04/2022    1:05 PM  CMP  Glucose 70 - 99 mg/dL 409  811    BUN 8 - 23 mg/dL 10  12    Creatinine 9.14 - 1.00 mg/dL 7.82  9.56  2.13   Sodium 135 - 145 mmol/L 139  140    Potassium 3.5 - 5.1 mmol/L 3.1  4.1    Chloride 98 - 111 mmol/L 101  104    CO2 22 - 32 mmol/L 20  23    Calcium 8.9 - 10.3 mg/dL 9.1  9.2    Total Protein 6.5 - 8.1 g/dL 7.2  6.6    Total Bilirubin 0.0 - 1.2 mg/dL 0.4  0.3    Alkaline Phos 38 - 126 U/L 78  74    AST 15 - 41 U/L 19  19     ALT 0 - 44 U/L 10  11      Latest Reference Range & Units 05/17/23 09:24  Iron  28 - 170 ug/dL 72  UIBC ug/dL 086  TIBC 578 - 469 ug/dL 629  Saturation Ratios 10.4 - 31.8 % 21  Ferritin 11 - 307 ng/mL 111  Vitamin B12 180 - 914 pg/mL 1,409 (H)  (H): Data is abnormally high  ASSESSMENT & PLAN:  Assessment/Plan:  A 68 y.o. 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   Although her hemoglobin and B12 levels are better since her B12 injections started, her hemoglobin remains below 10.  Based upon this, I will restart this patient on Revlimid 5 mg daily for her 5q minus syndrome diagnosis.  I will also see if I can add luspatercept  every 3 weeks to further bolster her anemia from her underlying myelodysplasia.  I will tentatively see her back in 6-8 weeks to see how well she is responding to the aforementioned interventions.  The patient understands all the plans discussed today and is in agreement with them.  Shaine Mount Felicia Horde, MD

## 2023-05-17 NOTE — Telephone Encounter (Signed)
 Oral Oncology Patient Advocate Encounter  Was successful in securing patient a $6,500.00 grant from [facility] to provide copayment coverage for Lenalidomide.  This will keep the out of pocket expense at $0.     [employer] ID: [id number]   The billing information is as follows and has been shared with [facility].    RxBin: [id number] PCN: [id number] Member ID: [id number] Group ID: [id number] Dates of Eligibility: 04/17/23 through 04/15/24  Fund:  Myelodysplastic Syndromes - Medicare Access   [name], CPhT Pharmacy Technician Coordinator [facility] [phone] (Ph) 05/17/2023 12:11 PM

## 2023-05-17 NOTE — Telephone Encounter (Deleted)
 [name]

## 2023-05-17 NOTE — Telephone Encounter (Signed)
 Oral Oncology Patient Advocate Encounter  Prior Authorization for Lenalidomide has been approved.    PA# [id number]  Effective dates: 05/17/23 through 01/03/24  Patients co-pay is $275.19.  Obtained [employer] Norberta Beans to make co-pay $0.00.  Lenalidomide is a REMS drug and must be filled through [employer] per patient's insurance.    Hansel Ley, CPhT Oncology Pharmacy Patient Advocate  [facility]  [phone] (phone) [phone] (fax) 05/17/2023 12:14 PM

## 2023-05-17 NOTE — Telephone Encounter (Signed)
 Oral Oncology Patient Advocate Encounter  New authorization   Received notification that prior authorization for Lenalidomide is required.   PA submitted on 05/17/23  Key [id number]  Status is pending     Hansel Ley, CPhT Oncology Pharmacy Patient Advocate  [facility]  [phone] (phone) [phone] (fax) 05/17/2023 12:08 PM

## 2023-05-19 ENCOUNTER — Telehealth: Payer: Self-pay | Admitting: Oncology

## 2023-05-19 ENCOUNTER — Other Ambulatory Visit: Payer: Self-pay

## 2023-05-19 ENCOUNTER — Other Ambulatory Visit: Payer: Self-pay | Admitting: Oncology

## 2023-05-19 ENCOUNTER — Encounter: Payer: Self-pay | Admitting: Oncology

## 2023-05-19 MED ORDER — LENALIDOMIDE 5 MG PO CAPS: 5.0000 mg | ORAL_CAPSULE | Freq: Every day | ORAL | 5 refills | Status: DC

## 2023-05-19 NOTE — Telephone Encounter (Signed)
 Patient has been scheduled. Aware of appt date and time.     Scheduling Message Entered by Deretha Fleck on 05/19/2023 at 11:34 AM Priority: Routine <No visit type provided>  Department: [facility]- MED ONC  Provider:  Appointment Notes:  Please schedule pt for reblozyl  early next week.  We can use labs from 5/14 for this dose.  Then schedule pt treatment plan requests.

## 2023-05-20 ENCOUNTER — Other Ambulatory Visit: Payer: Self-pay

## 2023-05-21 NOTE — Progress Notes (Signed)
 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  Counseled patient on administration, dosing, side effects, monitoring, drug-food interactions, safe handling, storage, and disposal.  Patient will take Revlimid  5mg  capsules, 1 capsule by mouth once daily, without regard to food, with a full glass of water daily. Prescription dose and frequency assessed for appropriateness.  Revlimid  start date: pending script being sent to [facility].   Adverse effects of Revlimid  include but are not limited to: nausea, constipation, diarrhea, abdominal pain, rash, fatigue, drug fever, and decreased blood counts.    Labs from 05/17/23 (CBC) and 03/27/23 (CMP) assessed, no interventions needed.  Reviewed with patient importance of keeping a medication schedule and plan for any missed doses. No barriers to medication adherence identified. Evaluated chart and no patient barriers to medication adherence noted.  Medication reconciliation performed and medication/allergy list updated. No significant/ relevant drug drug interactions with the medications patient is currently taking and Revlimid .   Patient is already taking anticoagulation therapy with Xarelto . All questions answered. Patient voiced understanding and appreciation.   Medication education handout given to patient in clinic. Patient knows to call the office with questions or concerns. Oral Chemotherapy Clinic phone number provided to patient.   Esaiah Wanless, PharmD Hematology/Oncology Clinical Pharmacist [facility] Oral Chemotherapy [facility] [phone] 05/21/2023    8:10 PM

## 2023-05-22 ENCOUNTER — Inpatient Hospital Stay

## 2023-05-23 ENCOUNTER — Ambulatory Visit

## 2023-05-25 ENCOUNTER — Telehealth: Payer: Self-pay

## 2023-05-25 NOTE — Telephone Encounter (Signed)
 Received notification from [facility] that medication is scheduled for delivery to patient for today, 05/25/23.    Hansel Ley, CPhT Pharmacy Technician Coordinator [facility] [phone] (Ph) 05/25/2023 7:42 AM

## 2023-05-25 NOTE — Telephone Encounter (Signed)
 [name],RPH- Patient to receive revlimid  today - [name] if you can call patient to let her know she can start it. Khaliel Morey,RN: Pt starting Revlimid  tonight, 05/25/2023. Pt asking if she can take with Xarelto ? [name],RPH-  Yes, she asked that at the visit too,no interactions!

## 2023-05-26 ENCOUNTER — Encounter: Payer: Self-pay | Admitting: Oncology

## 2023-05-26 ENCOUNTER — Inpatient Hospital Stay

## 2023-05-30 ENCOUNTER — Inpatient Hospital Stay

## 2023-05-30 ENCOUNTER — Telehealth: Payer: Self-pay | Admitting: Oncology

## 2023-05-30 VITALS — BP 144/80 | HR 111 | Temp 97.7°F | Resp 16 | Ht 62.0 in | Wt 254.1 lb

## 2023-05-30 DIAGNOSIS — D46C Myelodysplastic syndrome with isolated del(5q) chromosomal abnormality: Secondary | ICD-10-CM

## 2023-05-30 DIAGNOSIS — D469 Myelodysplastic syndrome, unspecified: Secondary | ICD-10-CM | POA: Diagnosis not present

## 2023-05-30 MED ORDER — LUSPATERCEPT-AAMT 25 MG ~~LOC~~ SOLR
1.1000 mg/kg | Freq: Once | SUBCUTANEOUS | Status: AC
  Administered 2023-05-30: 125 mg via SUBCUTANEOUS
  Filled 2023-05-30: qty 1.5

## 2023-05-30 NOTE — Telephone Encounter (Signed)
 05/30/23 LVM next appt scheduled on 06/20/23 to arrive at 1pm.

## 2023-05-31 ENCOUNTER — Telehealth: Payer: Self-pay | Admitting: Oncology

## 2023-05-31 NOTE — Telephone Encounter (Signed)
 Patient has been scheduled for follow-up visit per 05/30/23 LOS.  Pt aware of scheduled appt details.

## 2023-06-01 ENCOUNTER — Telehealth: Payer: Self-pay

## 2023-06-01 NOTE — Telephone Encounter (Signed)
 I spoke with Malynn to see how she is feeling. She replied, "I'm tired. The last couple of days, I've been having more diarrhea. I usually take the Colestid  and it controls it". She reports she has been having 4-5 diarrhea episodes per day. She normally takes 2 Colestid  a day. She will increase it to 4 tabs (as written), before trying Imodium . I educated her of the importance of drinking a cup of fluid for each diarrhea episode she has, to prevent dehydration. She verbalized understanding. I asked her to call me in the morning to give me update. She denies N/V, SOB, cough, skin rash, itching, abdominal pain, bruising, fever and chills. She is taking the Revlimid  @ 10p. Denies missed doses. I reminded her to call us  if she were to develop temp of 100.4 or higher, day or night. She voiced, "I will'. Confirmed next appt 06/20/2023.

## 2023-06-02 ENCOUNTER — Telehealth: Payer: Self-pay

## 2023-06-02 NOTE — Telephone Encounter (Signed)
 Pt called to report that she is having constant tingling in her hands, ankles, and feet. She hasn't ever had tingling in her ankles before. "I cut my spinal cord stimulator down last night". She takes gabapentin  600mg  po TID and Norco 10/325- 4 tabs/day for back pain. She started the Revlimid  on 05/25/2023.

## 2023-06-05 ENCOUNTER — Encounter: Payer: Self-pay | Admitting: Oncology

## 2023-06-06 ENCOUNTER — Telehealth: Payer: Self-pay

## 2023-06-06 NOTE — Telephone Encounter (Signed)
 Pt calling to ask if it is going to be ok for her to have tooth or teeth extraction tomorrow? She is on Revlimid , started 05/25/2023.  Dr Harles Lied: [name], I would prefer for her labs to be checked to ensure none of her counts have gotten low. I had no idea about a tooth being removed

## 2023-06-07 ENCOUNTER — Inpatient Hospital Stay: Attending: Oncology

## 2023-06-07 ENCOUNTER — Telehealth: Payer: Self-pay

## 2023-06-07 DIAGNOSIS — Z79899 Other long term (current) drug therapy: Secondary | ICD-10-CM | POA: Insufficient documentation

## 2023-06-07 DIAGNOSIS — D46C Myelodysplastic syndrome with isolated del(5q) chromosomal abnormality: Secondary | ICD-10-CM

## 2023-06-07 DIAGNOSIS — D469 Myelodysplastic syndrome, unspecified: Secondary | ICD-10-CM | POA: Insufficient documentation

## 2023-06-07 DIAGNOSIS — E538 Deficiency of other specified B group vitamins: Secondary | ICD-10-CM | POA: Insufficient documentation

## 2023-06-07 LAB — CBC WITH DIFFERENTIAL (CANCER CENTER ONLY)
Abs Immature Granulocytes: 0 10*3/uL (ref 0.00–0.07)
Basophils Absolute: 0.2 10*3/uL — ABNORMAL HIGH (ref 0.0–0.1)
Basophils Relative: 3 %
Eosinophils Absolute: 0.4 10*3/uL (ref 0.0–0.5)
Eosinophils Relative: 7 %
HCT: 34.9 % — ABNORMAL LOW (ref 36.0–46.0)
Hemoglobin: 10.9 g/dL — ABNORMAL LOW (ref 12.0–15.0)
Immature Granulocytes: 0 %
Lymphocytes Relative: 42 %
Lymphs Abs: 2.3 10*3/uL (ref 0.7–4.0)
MCH: 32 pg (ref 26.0–34.0)
MCHC: 31.2 g/dL (ref 30.0–36.0)
MCV: 102.3 fL — ABNORMAL HIGH (ref 80.0–100.0)
Monocytes Absolute: 0.4 10*3/uL (ref 0.1–1.0)
Monocytes Relative: 8 %
Neutro Abs: 2.2 10*3/uL (ref 1.7–7.7)
Neutrophils Relative %: 40 %
Platelet Count: 390 10*3/uL (ref 150–400)
RBC: 3.41 MIL/uL — ABNORMAL LOW (ref 3.87–5.11)
RDW: 19.6 % — ABNORMAL HIGH (ref 11.5–15.5)
WBC Count: 5.5 10*3/uL (ref 4.0–10.5)
nRBC: 0 % (ref 0.0–0.2)

## 2023-06-07 NOTE — Telephone Encounter (Signed)
 I called [name] to see how her dental appt went today. She states, "they put it off until tomorrow because I was late. They are going to see me in the morning".  [name] will ask the dentist about potential for infection post procedure (as suggested below by pharmacist) and will call me once she leaves the dental office.    [name],RPH: I would ask her to ask her dentist after she gets it done at how high of risk she is for infection (depends on how bad the tooth was and if there is infection already) and if they put her on antibiotics. Would hold Revlimid   3-4 days just for the gums to start healing some- would not want her to re-bleed or get dry socket from it.

## 2023-06-07 NOTE — Telephone Encounter (Signed)
 Called [name] to let her know that the letter for clearance for tooth extraction has been faxed to [facility] [phone].  She also needs to hold revlimid  for 1 to 2 days after the extraction and then cane resume if no bleeding per Dr. Harles Lied.

## 2023-06-08 ENCOUNTER — Encounter: Payer: Self-pay | Admitting: Oncology

## 2023-06-12 ENCOUNTER — Ambulatory Visit

## 2023-06-12 ENCOUNTER — Other Ambulatory Visit

## 2023-06-12 ENCOUNTER — Ambulatory Visit: Admitting: Oncology

## 2023-06-13 ENCOUNTER — Other Ambulatory Visit: Payer: Self-pay | Admitting: Oncology

## 2023-06-14 ENCOUNTER — Telehealth: Payer: Self-pay

## 2023-06-14 NOTE — Telephone Encounter (Signed)
 I called to see how pt is doing. She hasn't had her dental extraction yet. Her appt was moved to 06/15/2023, as she had diarrhea and cancelled Monday's appt. Her last dose of the Revlimid  was 06/06/23 @ 10p. She reports diarrhea and fatigue. She is taking the Colestid  3 times a day. I reminded her to drink a cup of fluid after each loose stool to prevent dehydration. She asked how long after extraction to restart the Revlimid ? I told Dr Harles Lied said 1-2 days. She will restart on Friday, 06/16/2023. No fevers, chills, cough, SOB, skin rash and itching noted.

## 2023-06-16 ENCOUNTER — Other Ambulatory Visit

## 2023-06-16 ENCOUNTER — Ambulatory Visit

## 2023-06-19 ENCOUNTER — Other Ambulatory Visit: Payer: Self-pay | Admitting: Oncology

## 2023-06-19 DIAGNOSIS — D46C Myelodysplastic syndrome with isolated del(5q) chromosomal abnormality: Secondary | ICD-10-CM

## 2023-06-19 MED ORDER — LENALIDOMIDE 5 MG PO CAPS: ORAL_CAPSULE | ORAL | 5 refills | Status: DC

## 2023-06-20 ENCOUNTER — Encounter: Payer: Self-pay | Admitting: Oncology

## 2023-06-20 ENCOUNTER — Inpatient Hospital Stay

## 2023-06-20 ENCOUNTER — Telehealth: Payer: Self-pay

## 2023-06-20 ENCOUNTER — Inpatient Hospital Stay: Admitting: Hematology and Oncology

## 2023-06-20 NOTE — Progress Notes (Deleted)
 [facility]  [address] [place],  [place]  [address] [phone]  Clinic Day:  06/20/2023  Referring physician: Verdia Glad, NP   HISTORY OF PRESENT ILLNESS:  The patient is a 68 y.o. 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  This was proven per cytogenetics done on her bone marrow biopsy in April 2019.  She briefly took Revlimid .  However, due to severe diarrhea, her Revlimid  was discontinued in July 2020.  The patient has been doing well until recently when her hemoglobin began to fall again.  Her second bone marrow biopsy in September 2024 revealed the persistent presence of myelodysplasia; it also showed absent iron  stores.  This led to her receiving IV iron  in November.  Labs also showed evidence of B12 deficiency, for which she received a protracted course of B12 injections.  Due to persistent anemia despite normalization iron  and B12, she was restarted on Revlimid  5 mg daily in addition to the luspatercept  every 3 weeks and treatment of her anemia.  She comes in today for repeat clinical assessment.   VITALS:   There were no vitals taken for this visit. Wt Readings from Last 3 Encounters:  05/30/23 254 lb 1.9 oz (115.3 kg)  05/17/23 251 lb (113.9 kg)  03/27/23 247 lb 12.8 oz (112.4 kg)   There is no height or weight on file to calculate BMI.  Performance status (ECOG): {CHL ONC [id number]  PHYSICAL EXAM:   Physical Exam   LABS:      Latest Ref Rng & Units 06/07/2023    8:29 AM 05/17/2023    9:24 AM 03/27/2023   10:25 AM  CBC  WBC 4.0 - 10.5 K/uL 5.5  4.6  7.1   Hemoglobin 12.0 - 15.0 g/dL 91.4  9.4  7.9   Hematocrit 36.0 - 46.0 % 34.9  29.6  24.7   Platelets 150 - 400 K/uL 390  321  390       Latest Ref Rng & Units 03/27/2023   10:25 AM 02/10/2023    3:28 PM 08/04/2022    1:05 PM  CMP  Glucose 70 - 99 mg/dL 782  956    BUN 8 - 23 mg/dL 10  12    Creatinine 2.13 - 1.00 mg/dL 0.86  5.78  4.69    Sodium 135 - 145 mmol/L 139  140    Potassium 3.5 - 5.1 mmol/L 3.1  4.1    Chloride 98 - 111 mmol/L 101  104    CO2 22 - 32 mmol/L 20  23    Calcium 8.9 - 10.3 mg/dL 9.1  9.2    Total Protein 6.5 - 8.1 g/dL 7.2  6.6    Total Bilirubin 0.0 - 1.2 mg/dL 0.4  0.3    Alkaline Phos 38 - 126 U/L 78  74    AST 15 - 41 U/L 19  19    ALT 0 - 44 U/L 10  11       Lab Results  Component Value Date   TIBC 340 05/17/2023   TIBC 351 03/27/2023   TIBC 335 02/10/2023   FERRITIN 111 05/17/2023   FERRITIN 158 03/27/2023   FERRITIN 211 02/10/2023   IRONPCTSAT 21 05/17/2023   IRONPCTSAT 26 03/27/2023   IRONPCTSAT 36 (H) 02/10/2023    Review Flowsheet  More data exists      Latest Ref Rng & Units 02/10/2023 03/27/2023 05/17/2023  Oncology Labs  Ferritin 11 -  307 ng/mL 211  158  111   %SAT 10.4 - 31.8 % 36  26  21      STUDIES:   No results found.    ASSESSMENT & PLAN:   Assessment/Plan:  68 y.o. 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  Due to persistent anemia, she on Revlimid  5 mg daily in May.  She is also receiving luspatercept  every 3 weeks to further bolster her anemia from her underlying myelodysplasia.   The patient understands all the plans discussed today and is in agreement with them.  She knows to contact our office if she develops concerns prior to her next appointment.     Alfonso Ike, PA-C   Physician Assistant [facility] [phone]

## 2023-06-20 NOTE — Telephone Encounter (Signed)
 I called pt to se if her dental work got taken care of yesterday. She replied, No, when I went Friday they told me it was going to take longer than expected. I had appt with another doctor Friday evening. So their next appt is in July. Right now, my tooth isn't hurting. I started taking the Revlimid  on Sat. If I need to come in today, I can, but the battery in my back stimulator isn't working. I have a call into [employer]. I told pt I would relay message to Dr Harles Lied and see if he wants to move her appt.

## 2023-06-21 ENCOUNTER — Encounter: Payer: Self-pay | Admitting: Oncology

## 2023-06-21 NOTE — Progress Notes (Unsigned)
 [facility]  [address] [place],  [place]  [address] [phone]  Clinic Day:  06/22/2023  Referring physician: Verdia Glad, NP   HISTORY OF PRESENT ILLNESS:  The patient is a 68 y.o. 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  As her hemoglobin fell below 10 again, Revlimid  5 mg every day was restarted, in conjunction with Luspatercept .  She comes in today to check her CBC to see how well she responded to this combination.  The patient still complains of fatigue on a daily basis.  However, she denies that it has been particularly worse since her Revlimid  and luspatercept  were started.  She is compliant with her daily Revlimid .  She has thus far received just 1 shot of luspatercept .  This diagnosis was proven per cytogenetics done on her bone marrow biopsy in April 2019.  She briefly took Revlimid .  However, due to severe diarrhea, her Revlimid  was discontinued in July 2020.  The patient had been doing well until her hemoglobin began to fall again in 2024.  Her second bone marrow biopsy in September 2024 revealed the persistence of myelodysplasia; it also showed absent iron  stores.  This led to her receiving IV iron  recently.  Labs also showed evidence of B12 deficiency for which she has taken a protracted course of B12 injections.  Despite receiving iron  and B12, her hemoglobin remained suboptimal to where she was started on Revlimid /luspatercept  in May 2025.  PHYSICAL EXAM:  Blood pressure 118/66, pulse 83, temperature 98.8 F (37.1 C), temperature source Oral, resp. rate 16, height 5' 2 (1.575 m), weight 243 lb 8 oz (110.5 kg), SpO2 98%. Wt Readings from Last 3 Encounters:  06/22/23 243 lb 8 oz (110.5 kg)  05/30/23 254 lb 1.9 oz (115.3 kg)  05/17/23 251 lb (113.9 kg)   Body mass index is 44.54 kg/m. Performance status (ECOG): 1 - Symptomatic but completely ambulatory Physical Exam Constitutional:       Appearance: Normal appearance. She is not ill-appearing.  HENT:     Mouth/Throat:     Mouth: Mucous membranes are moist.     Pharynx: Oropharynx is clear. No oropharyngeal exudate or posterior oropharyngeal erythema.   Cardiovascular:     Rate and Rhythm: Normal rate and regular rhythm.     Heart sounds: No murmur heard.    No friction rub. No gallop.  Pulmonary:     Effort: Pulmonary effort is normal. No respiratory distress.     Breath sounds: Normal breath sounds. No wheezing, rhonchi or rales.  Abdominal:     General: Bowel sounds are normal. There is no distension.     Palpations: Abdomen is soft. There is no mass.     Tenderness: There is no abdominal tenderness.   Musculoskeletal:        General: No swelling.     Right lower leg: No edema.     Left lower leg: No edema.  Lymphadenopathy:     Cervical: No cervical adenopathy.     Upper Body:     Right upper body: No supraclavicular or axillary adenopathy.     Left upper body: No supraclavicular or axillary adenopathy.     Lower Body: No right inguinal adenopathy. No left inguinal adenopathy.   Skin:    General: Skin is warm.     Coloration: Skin is not jaundiced.     Findings: No lesion or rash.   Neurological:     General: No  focal deficit present.     Mental Status: She is alert and oriented to person, place, and time. Mental status is at baseline.   Psychiatric:        Mood and Affect: Mood normal.        Behavior: Behavior normal.        Thought Content: Thought content normal.    LABS:      Latest Ref Rng & Units 06/22/2023   11:03 AM 06/07/2023    8:29 AM 05/17/2023    9:24 AM  CBC  WBC 4.0 - 10.5 K/uL 4.1  5.5  4.6   Hemoglobin 12.0 - 15.0 g/dL 16.1  09.6  9.4   Hematocrit 36.0 - 46.0 % 33.0  34.9  29.6   Platelets 150 - 400 K/uL 259  390  321       Latest Ref Rng & Units 03/27/2023   10:25 AM 02/10/2023    3:28 PM 08/04/2022    1:05 PM  CMP  Glucose 70 - 99 mg/dL 045  409    BUN 8 - 23 mg/dL 10  12     Creatinine 8.11 - 1.00 mg/dL 9.14  7.82  9.56   Sodium 135 - 145 mmol/L 139  140    Potassium 3.5 - 5.1 mmol/L 3.1  4.1    Chloride 98 - 111 mmol/L 101  104    CO2 22 - 32 mmol/L 20  23    Calcium 8.9 - 10.3 mg/dL 9.1  9.2    Total Protein 6.5 - 8.1 g/dL 7.2  6.6    Total Bilirubin 0.0 - 1.2 mg/dL 0.4  0.3    Alkaline Phos 38 - 126 U/L 78  74    AST 15 - 41 U/L 19  19    ALT 0 - 44 U/L 10  11     ASSESSMENT & PLAN:  Assessment/Plan:  A 68 y.o. 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  I am pleased as her hemoglobin has risen above 10 since restarting her Revlimid , which she is taking in conjunction with luspatercept .  She knows to continue taking Revlimid  5 mg daily for her 5q minus syndrome diagnosis.  She will also continue taking luspatercept  every 3 weeks to further bolster her anemia from her underlying myelodysplasia.  I will tentatively see her back in 12 weeks to see how well she is responding to the aforementioned interventions.  The patient understands all the plans discussed today and is in agreement with them.  Danne Scardina Felicia Horde, MD

## 2023-06-22 ENCOUNTER — Inpatient Hospital Stay (HOSPITAL_BASED_OUTPATIENT_CLINIC_OR_DEPARTMENT_OTHER): Admitting: Oncology

## 2023-06-22 ENCOUNTER — Inpatient Hospital Stay

## 2023-06-22 ENCOUNTER — Other Ambulatory Visit: Payer: Self-pay | Admitting: Oncology

## 2023-06-22 VITALS — BP 118/66 | HR 83 | Temp 98.8°F | Resp 16 | Ht 62.0 in | Wt 243.5 lb

## 2023-06-22 DIAGNOSIS — D46C Myelodysplastic syndrome with isolated del(5q) chromosomal abnormality: Secondary | ICD-10-CM

## 2023-06-22 DIAGNOSIS — D469 Myelodysplastic syndrome, unspecified: Secondary | ICD-10-CM | POA: Diagnosis not present

## 2023-06-22 LAB — CBC WITH DIFFERENTIAL (CANCER CENTER ONLY)
Abs Immature Granulocytes: 0.03 10*3/uL (ref 0.00–0.07)
Basophils Absolute: 0.2 10*3/uL — ABNORMAL HIGH (ref 0.0–0.1)
Basophils Relative: 4 %
Eosinophils Absolute: 0.2 10*3/uL (ref 0.0–0.5)
Eosinophils Relative: 5 %
HCT: 33 % — ABNORMAL LOW (ref 36.0–46.0)
Hemoglobin: 10.2 g/dL — ABNORMAL LOW (ref 12.0–15.0)
Immature Granulocytes: 1 %
Immature Platelet Fraction: 12 % — ABNORMAL HIGH (ref 1.2–8.6)
Lymphocytes Relative: 51 %
Lymphs Abs: 2.1 10*3/uL (ref 0.7–4.0)
MCH: 30 pg (ref 26.0–34.0)
MCHC: 30.9 g/dL (ref 30.0–36.0)
MCV: 97.1 fL (ref 80.0–100.0)
Monocytes Absolute: 0.3 10*3/uL (ref 0.1–1.0)
Monocytes Relative: 7 %
Neutro Abs: 1.3 10*3/uL — ABNORMAL LOW (ref 1.7–7.7)
Neutrophils Relative %: 32 %
Platelet Count: 259 10*3/uL (ref 150–400)
RBC: 3.4 MIL/uL — ABNORMAL LOW (ref 3.87–5.11)
RDW: 18.3 % — ABNORMAL HIGH (ref 11.5–15.5)
WBC Count: 4.1 10*3/uL (ref 4.0–10.5)
nRBC: 0 % (ref 0.0–0.2)

## 2023-06-22 MED ORDER — LUSPATERCEPT-AAMT 75 MG ~~LOC~~ SOLR
1.1000 mg/kg | Freq: Once | SUBCUTANEOUS | Status: AC
  Administered 2023-06-22: 125 mg via SUBCUTANEOUS
  Filled 2023-06-22: qty 1.5

## 2023-06-22 NOTE — Patient Instructions (Signed)
 Luspatercept Injection What is this medication? LUSPATERCEPT (lus PAT er sept) treats low levels of red blood cells (anemia) in the body in people with beta thalassemia or myelodysplastic syndromes. It works by helping the body make more red blood cells. This medicine may be used for other purposes; ask your health care provider or pharmacist if you have questions. COMMON BRAND NAME(S): REBLOZYL What should I tell my care team before I take this medication? They need to know if you have any of these conditions: Have had your spleen removed High blood pressure History of blood clots Tobacco use An unusual or allergic reaction to luspatercept, other medications, foods, dyes, or preservatives Pregnant or trying to get pregnant Breastfeeding How should I use this medication? This medication is injected under the skin. It is given by your care team in a hospital or clinic setting. Talk to your care team about the use of the medication in children. It is not approved for use in children. Overdosage: If you think you have taken too much of this medicine contact a poison control center or emergency room at once. NOTE: This medicine is only for you. Do not share this medicine with others. What if I miss a dose? Keep appointments for follow-up doses. It is important not to miss your dose. Call your care team if you are unable to keep an appointment. What may interact with this medication? Interactions are not expected. This list may not describe all possible interactions. Give your health care provider a list of all the medicines, herbs, non-prescription drugs, or dietary supplements you use. Also tell them if you smoke, drink alcohol, or use illegal drugs. Some items may interact with your medicine. What should I watch for while using this medication? Your condition will be monitored carefully while you are receiving this medication. You may need blood work done while you are taking this  medication. Talk to your care team if you may be pregnant. Serious birth defects can occur if you take this medication during pregnancy. Contraception is recommended while taking this medication. Your care team can help you find the option that works for you. Talk to your care team before breastfeeding. Changes to your treatment plan may be needed. What side effects may I notice from receiving this medication? Side effects that you should report to your care team as soon as possible: Allergic reactions--skin rash, itching, hives, swelling of the face, lips, tongue, or throat Blood clot--pain, swelling, or warmth in the leg, shortness of breath, chest pain Increase in blood pressure Severe back pain, numbness or weakness of the hands, arms, legs, or feet, loss of coordination, loss of bowel or bladder control Side effects that usually do not require medical attention (report these to your care team if they continue or are bothersome): Bone pain Dizziness Fatigue Headache Joint pain Muscle pain Stomach pain This list may not describe all possible side effects. Call your doctor for medical advice about side effects. You may report side effects to FDA at 1-800-FDA-1088. Where should I keep my medication? This medication is given in a hospital or clinic. It will not be stored at home. NOTE: This sheet is a summary. It may not cover all possible information. If you have questions about this medicine, talk to your doctor, pharmacist, or health care provider.  2024 Elsevier/Gold Standard (2022-05-20 00:00:00)

## 2023-06-23 ENCOUNTER — Other Ambulatory Visit: Payer: Self-pay

## 2023-07-03 ENCOUNTER — Other Ambulatory Visit

## 2023-07-03 ENCOUNTER — Telehealth: Payer: Self-pay

## 2023-07-03 ENCOUNTER — Ambulatory Visit

## 2023-07-03 NOTE — Telephone Encounter (Signed)
 Pt called to let us  know, I can't take anymore of those shots. I got bill today. I didn't know it was going to cost over $3700.

## 2023-07-04 ENCOUNTER — Encounter: Payer: Self-pay | Admitting: Oncology

## 2023-07-04 ENCOUNTER — Telehealth: Payer: Self-pay | Admitting: Pharmacy Technician

## 2023-07-04 NOTE — Telephone Encounter (Signed)
 Received referral from [facility] regarding obtaining medication assistance for Reblozyl  for patient.  Vernell Bruns verified prescription benefits through the [facility] Portal.  Discovered that patient had to meet OOP cost before insurance would begin paying for the Reblozyl .  Patient should not have to pay anything going forward. Attempted to contact patient to make aware.  Unable to reach.  Had to leave a message.  Dickey DOROTHA Fritter Patient Services Navigator [facility]

## 2023-07-10 ENCOUNTER — Telehealth: Payer: Self-pay

## 2023-07-10 NOTE — Telephone Encounter (Signed)
 Pt is taking the Revlimid  @ 10p. She missed doses when she was in process of getting her tooth repaired, but the dentist was out of network, so she just plans to take care of it later. Her tooth is not hurting at all right now. She denies fever, chills, skin rash, N/V, cough, and SOB.  She has 1-2 episodes of diarrhea daily. She takes Colestid  BID unless has more than 2 diarrhea, then she will take a 3rd one. She admits to itching like crazy. I take benadryl . I've been reading up on it. I'm going to get me some Cerave itching cream. She does have fatigue, but able to do ADL's at her speed. She uses seat in shower to rest. I notified [facility] & Dr Ezzard of above.

## 2023-07-13 ENCOUNTER — Inpatient Hospital Stay

## 2023-07-13 ENCOUNTER — Inpatient Hospital Stay: Attending: Oncology

## 2023-07-14 ENCOUNTER — Encounter: Payer: Self-pay | Admitting: Oncology

## 2023-07-14 ENCOUNTER — Inpatient Hospital Stay

## 2023-07-15 ENCOUNTER — Other Ambulatory Visit: Payer: Self-pay

## 2023-07-17 ENCOUNTER — Other Ambulatory Visit

## 2023-07-17 ENCOUNTER — Ambulatory Visit

## 2023-07-17 ENCOUNTER — Telehealth: Payer: Self-pay | Admitting: Oncology

## 2023-07-17 NOTE — Telephone Encounter (Signed)
 07/17/23 Patient called and rescheduled appt.

## 2023-07-18 ENCOUNTER — Other Ambulatory Visit: Payer: Self-pay

## 2023-07-19 ENCOUNTER — Inpatient Hospital Stay

## 2023-07-20 ENCOUNTER — Other Ambulatory Visit

## 2023-07-20 ENCOUNTER — Inpatient Hospital Stay

## 2023-07-20 ENCOUNTER — Other Ambulatory Visit: Payer: Self-pay | Admitting: Oncology

## 2023-07-21 ENCOUNTER — Encounter: Payer: Self-pay | Admitting: Oncology

## 2023-07-21 ENCOUNTER — Other Ambulatory Visit: Payer: Self-pay | Admitting: Pharmacist

## 2023-07-24 ENCOUNTER — Other Ambulatory Visit

## 2023-07-24 ENCOUNTER — Ambulatory Visit

## 2023-07-24 ENCOUNTER — Inpatient Hospital Stay

## 2023-07-24 ENCOUNTER — Ambulatory Visit: Admitting: Oncology

## 2023-07-24 ENCOUNTER — Other Ambulatory Visit: Payer: Self-pay

## 2023-07-25 ENCOUNTER — Other Ambulatory Visit

## 2023-07-25 ENCOUNTER — Ambulatory Visit

## 2023-07-25 ENCOUNTER — Telehealth: Payer: Self-pay | Admitting: Oncology

## 2023-07-25 NOTE — Telephone Encounter (Signed)
 07/25/23 Patient cancelled appts.She will call back when ready to reschedule.

## 2023-07-26 ENCOUNTER — Other Ambulatory Visit: Payer: Self-pay | Admitting: Oncology

## 2023-07-26 MED ORDER — LENALIDOMIDE 5 MG PO CAPS: ORAL_CAPSULE | ORAL | 5 refills | Status: DC

## 2023-08-02 ENCOUNTER — Encounter: Payer: Self-pay | Admitting: Oncology

## 2023-08-02 ENCOUNTER — Ambulatory Visit

## 2023-08-02 ENCOUNTER — Other Ambulatory Visit

## 2023-08-04 ENCOUNTER — Ambulatory Visit

## 2023-08-04 ENCOUNTER — Other Ambulatory Visit

## 2023-08-05 NOTE — Progress Notes (Unsigned)
 [facility]  [address] [place],  [place]  [address] [phone]  Clinic Day:  08/07/2023  Referring physician: Zachary Lamar BRAVO, NP   HISTORY OF PRESENT ILLNESS:  The patient is a 68 y.o. 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  As her hemoglobin fell below 10 again, Revlimid  5 mg every day was restarted, in conjunction with Luspatercept .  She comes in today to check her CBC to see how well she responded to this combination.  The patient still complains of fatigue on a daily basis.  However, she denies that it has been particularly worse since her Revlimid  and luspatercept  were started.  She is compliant with her daily Revlimid .  She has thus far received just 1 shot of luspatercept .  This diagnosis was proven per cytogenetics done on her bone marrow biopsy in April 2019.  She briefly took Revlimid .  However, due to severe diarrhea, her Revlimid  was discontinued in July 2020.  The patient had been doing well until her hemoglobin began to fall again in 2024.  Her second bone marrow biopsy in September 2024 revealed the persistence of myelodysplasia; it also showed absent iron  stores.  This led to her receiving IV iron  recently.  Labs also showed evidence of B12 deficiency for which she has taken a protracted course of B12 injections.  Despite receiving iron  and B12, her hemoglobin remained suboptimal to where she was started on Revlimid /luspatercept  in May 2025.  PHYSICAL EXAM:  There were no vitals taken for this visit. Wt Readings from Last 3 Encounters:  06/22/23 243 lb 8 oz (110.5 kg)  05/30/23 254 lb 1.9 oz (115.3 kg)  05/17/23 251 lb (113.9 kg)   There is no height or weight on file to calculate BMI. Performance status (ECOG): 1 - Symptomatic but completely ambulatory Physical Exam Constitutional:      Appearance: Normal appearance. She is not ill-appearing.  HENT:     Mouth/Throat:     Mouth: Mucous  membranes are moist.     Pharynx: Oropharynx is clear. No oropharyngeal exudate or posterior oropharyngeal erythema.  Cardiovascular:     Rate and Rhythm: Normal rate and regular rhythm.     Heart sounds: No murmur heard.    No friction rub. No gallop.  Pulmonary:     Effort: Pulmonary effort is normal. No respiratory distress.     Breath sounds: Normal breath sounds. No wheezing, rhonchi or rales.  Abdominal:     General: Bowel sounds are normal. There is no distension.     Palpations: Abdomen is soft. There is no mass.     Tenderness: There is no abdominal tenderness.  Musculoskeletal:        General: No swelling.     Right lower leg: No edema.     Left lower leg: No edema.  Lymphadenopathy:     Cervical: No cervical adenopathy.     Upper Body:     Right upper body: No supraclavicular or axillary adenopathy.     Left upper body: No supraclavicular or axillary adenopathy.     Lower Body: No right inguinal adenopathy. No left inguinal adenopathy.  Skin:    General: Skin is warm.     Coloration: Skin is not jaundiced.     Findings: No lesion or rash.  Neurological:     General: No focal deficit present.     Mental Status: She is alert and oriented to person, place, and time. Mental status  is at baseline.  Psychiatric:        Mood and Affect: Mood normal.        Behavior: Behavior normal.        Thought Content: Thought content normal.    LABS:      Latest Ref Rng & Units 06/22/2023   11:03 AM 06/07/2023    8:29 AM 05/17/2023    9:24 AM  CBC  WBC 4.0 - 10.5 K/uL 4.1  5.5  4.6   Hemoglobin 12.0 - 15.0 g/dL 89.7  89.0  9.4   Hematocrit 36.0 - 46.0 % 33.0  34.9  29.6   Platelets 150 - 400 K/uL 259  390  321       Latest Ref Rng & Units 03/27/2023   10:25 AM 02/10/2023    3:28 PM 08/04/2022    1:05 PM  CMP  Glucose 70 - 99 mg/dL 848  868    BUN 8 - 23 mg/dL 10  12    Creatinine 9.55 - 1.00 mg/dL 8.75  8.51  8.79   Sodium 135 - 145 mmol/L 139  140    Potassium 3.5 - 5.1  mmol/L 3.1  4.1    Chloride 98 - 111 mmol/L 101  104    CO2 22 - 32 mmol/L 20  23    Calcium 8.9 - 10.3 mg/dL 9.1  9.2    Total Protein 6.5 - 8.1 g/dL 7.2  6.6    Total Bilirubin 0.0 - 1.2 mg/dL 0.4  0.3    Alkaline Phos 38 - 126 U/L 78  74    AST 15 - 41 U/L 19  19    ALT 0 - 44 U/L 10  11     ASSESSMENT & PLAN:  Assessment/Plan:  A 68 y.o. 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  I am pleased as her hemoglobin has risen above 10 since restarting her Revlimid , which she is taking in conjunction with luspatercept .  She knows to continue taking Revlimid  5 mg daily for her 5q minus syndrome diagnosis.  She will also continue taking luspatercept  every 3 weeks to further bolster her anemia from her underlying myelodysplasia.  I will tentatively see her back in 12 weeks to see how well she is responding to the aforementioned interventions.  The patient understands all the plans discussed today and is in agreement with them.  [name] Diana Kerns, MD

## 2023-08-07 ENCOUNTER — Inpatient Hospital Stay: Attending: Oncology | Admitting: Oncology

## 2023-08-07 ENCOUNTER — Telehealth: Payer: Self-pay

## 2023-08-07 ENCOUNTER — Other Ambulatory Visit

## 2023-08-07 ENCOUNTER — Ambulatory Visit

## 2023-08-07 ENCOUNTER — Other Ambulatory Visit: Payer: Self-pay | Admitting: Oncology

## 2023-08-07 ENCOUNTER — Inpatient Hospital Stay

## 2023-08-07 VITALS — BP 139/77 | HR 63 | Temp 98.6°F | Resp 16 | Ht 62.0 in | Wt 232.7 lb

## 2023-08-07 DIAGNOSIS — Z7961 Long term (current) use of immunomodulator: Secondary | ICD-10-CM | POA: Diagnosis not present

## 2023-08-07 DIAGNOSIS — D46C Myelodysplastic syndrome with isolated del(5q) chromosomal abnormality: Secondary | ICD-10-CM

## 2023-08-07 DIAGNOSIS — D469 Myelodysplastic syndrome, unspecified: Secondary | ICD-10-CM | POA: Insufficient documentation

## 2023-08-07 DIAGNOSIS — Z79899 Other long term (current) drug therapy: Secondary | ICD-10-CM | POA: Diagnosis not present

## 2023-08-07 DIAGNOSIS — E538 Deficiency of other specified B group vitamins: Secondary | ICD-10-CM | POA: Insufficient documentation

## 2023-08-07 LAB — CBC WITH DIFFERENTIAL (CANCER CENTER ONLY)
Abs Immature Granulocytes: 0.03 K/uL (ref 0.00–0.07)
Basophils Absolute: 0.3 K/uL — ABNORMAL HIGH (ref 0.0–0.1)
Basophils Relative: 9 %
Eosinophils Absolute: 0.2 K/uL (ref 0.0–0.5)
Eosinophils Relative: 7 %
HCT: 37.2 % (ref 36.0–46.0)
Hemoglobin: 11.2 g/dL — ABNORMAL LOW (ref 12.0–15.0)
Immature Granulocytes: 1 %
Lymphocytes Relative: 52 %
Lymphs Abs: 1.7 K/uL (ref 0.7–4.0)
MCH: 26.5 pg (ref 26.0–34.0)
MCHC: 30.1 g/dL (ref 30.0–36.0)
MCV: 87.9 fL (ref 80.0–100.0)
Monocytes Absolute: 0.3 K/uL (ref 0.1–1.0)
Monocytes Relative: 8 %
Neutro Abs: 0.8 K/uL — ABNORMAL LOW (ref 1.7–7.7)
Neutrophils Relative %: 23 %
Platelet Count: 193 K/uL (ref 150–400)
RBC: 4.23 MIL/uL (ref 3.87–5.11)
RDW: 18.6 % — ABNORMAL HIGH (ref 11.5–15.5)
WBC Count: 3.3 K/uL — ABNORMAL LOW (ref 4.0–10.5)
nRBC: 0 % (ref 0.0–0.2)

## 2023-08-07 MED ORDER — LUSPATERCEPT-AAMT 75 MG ~~LOC~~ SOLR
1.1000 mg/kg | Freq: Once | SUBCUTANEOUS | Status: AC
  Administered 2023-08-07: 125 mg via SUBCUTANEOUS
  Filled 2023-08-07: qty 1.5

## 2023-08-07 NOTE — Telephone Encounter (Signed)
 Pt continues to take the Revlimid  5mg  q night @ 10p. No missed doses. She has 3-5 episodes of diarrhea daily. She is taking the Colestid  2 tabs BID. She is down 11# since last clinic visit. Her appetite isn't the problem per pt. It's just anything I eat goes right through me. She has periodic episodes of feeling cold, frequently fatigued, and itches daily. Sh takes benadryl  po and applies some type of lotion/cream like CeraVe. She denies N/V, mouth sores, rash, cough, SOB, and fevers. Her Hgb is up to 11.2 today. Dr Ezzard and pt pleased. Pt taken via wheelchair to infusion room by Madelin Samie LATHER, for Luspatercept  injection before going home.

## 2023-08-07 NOTE — Patient Instructions (Signed)
 Luspatercept Injection What is this medication? LUSPATERCEPT (lus PAT er sept) treats low levels of red blood cells (anemia) in the body in people with beta thalassemia or myelodysplastic syndromes. It works by helping the body make more red blood cells. This medicine may be used for other purposes; ask your health care provider or pharmacist if you have questions. COMMON BRAND NAME(S): REBLOZYL What should I tell my care team before I take this medication? They need to know if you have any of these conditions: Have had your spleen removed High blood pressure History of blood clots Tobacco use An unusual or allergic reaction to luspatercept, other medications, foods, dyes, or preservatives Pregnant or trying to get pregnant Breastfeeding How should I use this medication? This medication is injected under the skin. It is given by your care team in a hospital or clinic setting. Talk to your care team about the use of the medication in children. It is not approved for use in children. Overdosage: If you think you have taken too much of this medicine contact a poison control center or emergency room at once. NOTE: This medicine is only for you. Do not share this medicine with others. What if I miss a dose? Keep appointments for follow-up doses. It is important not to miss your dose. Call your care team if you are unable to keep an appointment. What may interact with this medication? Interactions are not expected. This list may not describe all possible interactions. Give your health care provider a list of all the medicines, herbs, non-prescription drugs, or dietary supplements you use. Also tell them if you smoke, drink alcohol, or use illegal drugs. Some items may interact with your medicine. What should I watch for while using this medication? Your condition will be monitored carefully while you are receiving this medication. You may need blood work done while you are taking this  medication. Talk to your care team if you may be pregnant. Serious birth defects can occur if you take this medication during pregnancy. Contraception is recommended while taking this medication. Your care team can help you find the option that works for you. Talk to your care team before breastfeeding. Changes to your treatment plan may be needed. What side effects may I notice from receiving this medication? Side effects that you should report to your care team as soon as possible: Allergic reactions--skin rash, itching, hives, swelling of the face, lips, tongue, or throat Blood clot--pain, swelling, or warmth in the leg, shortness of breath, chest pain Increase in blood pressure Severe back pain, numbness or weakness of the hands, arms, legs, or feet, loss of coordination, loss of bowel or bladder control Side effects that usually do not require medical attention (report these to your care team if they continue or are bothersome): Bone pain Dizziness Fatigue Headache Joint pain Muscle pain Stomach pain This list may not describe all possible side effects. Call your doctor for medical advice about side effects. You may report side effects to FDA at 1-800-FDA-1088. Where should I keep my medication? This medication is given in a hospital or clinic. It will not be stored at home. NOTE: This sheet is a summary. It may not cover all possible information. If you have questions about this medicine, talk to your doctor, pharmacist, or health care provider.  2024 Elsevier/Gold Standard (2022-05-20 00:00:00)

## 2023-08-09 ENCOUNTER — Other Ambulatory Visit

## 2023-08-09 ENCOUNTER — Ambulatory Visit

## 2023-08-14 ENCOUNTER — Other Ambulatory Visit: Payer: Self-pay

## 2023-08-21 ENCOUNTER — Other Ambulatory Visit: Payer: Self-pay | Admitting: Oncology

## 2023-08-23 ENCOUNTER — Encounter: Payer: Self-pay | Admitting: Oncology

## 2023-08-24 ENCOUNTER — Other Ambulatory Visit: Payer: Self-pay

## 2023-08-25 ENCOUNTER — Encounter: Payer: Self-pay | Admitting: Oncology

## 2023-08-25 ENCOUNTER — Other Ambulatory Visit: Payer: Self-pay

## 2023-08-25 ENCOUNTER — Ambulatory Visit

## 2023-08-25 ENCOUNTER — Other Ambulatory Visit

## 2023-08-25 DIAGNOSIS — D46C Myelodysplastic syndrome with isolated del(5q) chromosomal abnormality: Secondary | ICD-10-CM

## 2023-08-25 MED ORDER — LENALIDOMIDE 5 MG PO CAPS: ORAL_CAPSULE | ORAL | 5 refills | Status: DC

## 2023-08-28 ENCOUNTER — Inpatient Hospital Stay

## 2023-08-28 ENCOUNTER — Ambulatory Visit: Admitting: Oncology

## 2023-08-28 ENCOUNTER — Ambulatory Visit

## 2023-08-28 ENCOUNTER — Other Ambulatory Visit

## 2023-08-29 ENCOUNTER — Inpatient Hospital Stay

## 2023-08-30 ENCOUNTER — Ambulatory Visit

## 2023-08-30 ENCOUNTER — Other Ambulatory Visit

## 2023-08-30 ENCOUNTER — Inpatient Hospital Stay

## 2023-09-05 ENCOUNTER — Other Ambulatory Visit: Payer: Self-pay

## 2023-09-06 ENCOUNTER — Inpatient Hospital Stay

## 2023-09-07 ENCOUNTER — Inpatient Hospital Stay: Attending: Oncology

## 2023-09-08 ENCOUNTER — Inpatient Hospital Stay: Attending: Oncology

## 2023-09-08 ENCOUNTER — Telehealth: Payer: Self-pay

## 2023-09-08 NOTE — Telephone Encounter (Signed)
 Pt called to ask if she needed to R/S her lab appt for today. I have been sick all night. I had a fever and sweated, but I don't have a thermometer to tell you what my temperature was. I have been nauseated, and had diarrhea. I've been fighting a kidney infection and am on the second round of antibiotics. Call transferred to scheduling and Melissa Parsons,NP for update.

## 2023-09-12 ENCOUNTER — Inpatient Hospital Stay

## 2023-09-14 ENCOUNTER — Inpatient Hospital Stay: Attending: Oncology | Admitting: Dietician

## 2023-09-14 ENCOUNTER — Telehealth: Payer: Self-pay | Admitting: Dietician

## 2023-09-14 NOTE — Telephone Encounter (Signed)
 Marland Kitchen  cd

## 2023-09-14 NOTE — Progress Notes (Signed)
 Nutrition Assessment  Patient picked up call as leaving message.  Reason for Assessment: MST screen for weight loss.    ASSESSMENT: Patient is a 68 year old female with low-grade myelodysplasia.  She also as PMHx that includes GERD, AKI, HLD, HTN, IDA, and recurrent UTIs.  Patient reports she struggling with nausea, diarrhea (4 loose stools per day), and anorexia.  She is only eating once a day and trying to drink plenty of fluids.  Mostly water and soda.  PO lately chik filet sandwich, cheezit crackers, pizza.  Likes Gingerale, doesn't like sports drinks or electrolyte replacements, orange juice.  Will tolerate V-8 juice and willing to add to provide K+, Na+ and Vit C.  Likes oodles of noodles.   Patient reports she needs a colonoscopy and has an appointment schedule with an anesthesiologist at the hospital on Monday but her colonoscopy procedure has not yet been scheduled.  Anthropometrics:  weight loss 15# (6%) past 5 months with some fluctuations.  Height: 62 Weight: 232.7# UBW: 250# BMI: 42.56    NUTRITION DIAGNOSIS: Inadequate PO intake to meet increased nutrient needs, r/t cancer diagnosis   INTERVENTION:  Relayed that nutrition services are wrap around service provided at no charge and encouraged continued communication if experiencing continued weight loss or any nutritional impact symptoms (NIS).  Educated on importance of adequate calorie and protein energy intake  with nutrient dense foods when possible to maintain weight/strength  Encouraged small frequent feeds and protein pacing with lean bland proteins  Discussed strategies for diarrhea. Emailed Nutrition Tip sheet  for  nausea and diarrhea with contact information provided to new email [email or web address]   MONITORING, EVALUATION, GOAL: weight, PO intake, Nutrition Impact Symptoms, labs  Next Visit: remote next week to monitor progress  Micheline Craven, RDN, LDN Registered Dietitian, [facility] Part Time Remote (Usual office hours: Tuesday-Thursday) Cell: [phone]

## 2023-09-15 ENCOUNTER — Inpatient Hospital Stay

## 2023-09-15 ENCOUNTER — Other Ambulatory Visit

## 2023-09-15 ENCOUNTER — Ambulatory Visit: Admitting: Oncology

## 2023-09-15 ENCOUNTER — Ambulatory Visit

## 2023-09-15 ENCOUNTER — Telehealth: Payer: Self-pay

## 2023-09-15 NOTE — Telephone Encounter (Signed)
 Patient not feeling well not wanting to come for labs today. Voice recent hemoglobin 11.4 from PCP they are to fax labs per patient. Also voiced they made her a GI referral for colonoscopy and urology referral for recurrent UTI. She wants to know if you have these labs and if that will be ok or if labs need to be rescheduled.

## 2023-09-18 ENCOUNTER — Other Ambulatory Visit

## 2023-09-18 ENCOUNTER — Ambulatory Visit: Admitting: Oncology

## 2023-09-18 ENCOUNTER — Ambulatory Visit

## 2023-09-19 ENCOUNTER — Other Ambulatory Visit: Payer: Self-pay | Admitting: Hematology and Oncology

## 2023-09-19 DIAGNOSIS — D46C Myelodysplastic syndrome with isolated del(5q) chromosomal abnormality: Secondary | ICD-10-CM

## 2023-09-19 MED ORDER — LENALIDOMIDE 5 MG PO CAPS: ORAL_CAPSULE | ORAL | 5 refills | Status: DC

## 2023-09-21 ENCOUNTER — Ambulatory Visit

## 2023-09-21 ENCOUNTER — Ambulatory Visit: Admitting: Oncology

## 2023-09-21 ENCOUNTER — Other Ambulatory Visit

## 2023-09-21 ENCOUNTER — Inpatient Hospital Stay: Admitting: Dietician

## 2023-09-21 NOTE — Progress Notes (Signed)
 Nutrition Follow Up:  Called patient at home telephone spouse picked up and gave patient phone for call.  PCP changed Abx for 09/18/23    Patient reports trying to eat a little bit.  Diarrhea a little improved.  Usual foods lately: Hamburger more meat than bread, chicken with Ramen noodles, has added some bananas.    Tolerate V-8 juice , no wild about yogurts but seemed willing to consider to boost probiotics in short term.   Patient reports many missed appointments and going to try to find a new GI MD.  Anthropometrics:  no new weight on file since weight loss 15# (6%) past 5 months with some fluctuations.  Height: 62 Weight: 232.7# UBW: 250# BMI: 42.56    NUTRITION DIAGNOSIS: Inadequate PO intake to meet increased nutrient needs, r/t cancer diagnosis   INTERVENTION:   Encouraged adding high probiotic yogurt daily for 2 weeks.  Encouraged small frequent feeds and protein pacing with lean bland proteins  Encouraged attention to hydration and increasing higher calorie fluids to aid in electrolyte replacement and weight maintenance.   MONITORING, EVALUATION, GOAL: weight, PO intake, Nutrition Impact Symptoms, labs  Next Visit: remote 2 next week to monitor progress  Micheline Craven, RDN, LDN Registered Dietitian, [facility] Part Time Remote (Usual office hours: Tuesday-Thursday) Cell: [phone]

## 2023-09-27 ENCOUNTER — Inpatient Hospital Stay

## 2023-10-02 ENCOUNTER — Inpatient Hospital Stay

## 2023-10-04 ENCOUNTER — Inpatient Hospital Stay

## 2023-10-04 ENCOUNTER — Telehealth: Payer: Self-pay

## 2023-10-04 NOTE — Telephone Encounter (Signed)
 Pt states she doesn't feel well. She has some GI issues going on & is just tired. She mentioned she has had about 3 UTI's in 8 weeks. She has appt with urologist this month & GI appt in Nov. Pt to keep scheduled appt here on 10/30/2023.

## 2023-10-05 ENCOUNTER — Inpatient Hospital Stay: Attending: Oncology | Admitting: Dietician

## 2023-10-05 NOTE — Progress Notes (Signed)
 Nutrition Follow Up:  Called patient at home telephone for remote follow up.  Missed appointments at cancer center as she was not feeling well.  Has appt with urologist this month & GI appt in Nov.   Patient reports still only eating one meal a day. Nothing taste right. She said lost 35# so that's good. Snacking on chips and orange sherbet. Drinking: water, and sodas, doesn't like any ONS, tried yogurts but couldn't get them down.  3-5 bowel movements some days, then nothing for a day.  Completed Abx and now taking probiotics BID.  Anthropometrics:  no new weight on file, she reports weight loss 35# (14%)    Height: 62 Weight:  08/07/23  232.7# UBW: 250# BMI: 42.56    NUTRITION DIAGNOSIS: Inadequate PO intake to meet increased nutrient needs, r/t cancer diagnosis. Continues   INTERVENTION:   Encouraged soluble fibers as prebiotic, and encouraged continued use of probiotics.  Encouraged more frequent feeds and protein pacing with lean bland proteins eating every 2-3 hours on schedule instead of waiting on hunger cues.  Encouraged attention to hydration and increasing higher calorie fluids to aid in electrolyte replacement and weight maintenance. Cautioned against apple juice (sorbitol).    MONITORING, EVALUATION, GOAL: weight, PO intake, Nutrition Impact Symptoms, labs  Next Visit: remote  next month to monitor progress  Micheline Craven, RDN, LDN Registered Dietitian, [facility] Part Time Remote (Usual office hours: Tuesday-Thursday) Cell: [phone]

## 2023-10-06 ENCOUNTER — Other Ambulatory Visit: Payer: Self-pay

## 2023-10-09 ENCOUNTER — Other Ambulatory Visit

## 2023-10-18 ENCOUNTER — Other Ambulatory Visit: Payer: Self-pay | Admitting: Hematology and Oncology

## 2023-10-18 DIAGNOSIS — D46C Myelodysplastic syndrome with isolated del(5q) chromosomal abnormality: Secondary | ICD-10-CM

## 2023-10-19 ENCOUNTER — Encounter: Payer: Self-pay | Admitting: Oncology

## 2023-10-29 NOTE — Progress Notes (Deleted)
 [facility]  [address] [place],  [place]  [address] [phone]  Clinic Day:  08/07/2023  Referring physician: Zachary Lamar BRAVO, NP   HISTORY OF PRESENT ILLNESS:  The patient is a 68 y.o. 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  As her hemoglobin fell below 10 again, Revlimid  5 mg every day has been restarted, in conjunction with Luspatercept .  She comes in today to check her hemoglobin to see how well she responded to this combination.  Over the past 2 weeks, the patient has complained of significant diarrhea.  She does not know why this symptom is occurring.  It has been discussed with her to undergo both a colonoscopy and upper endoscopy in the near future to rule out any GI tract abnormalities.  She does not believe her symptoms are related to either her Revlimid  or luspatercept  therapy..  This diagnosis was proven per cytogenetics done on her bone marrow biopsy in April 2019.  She briefly took Revlimid .  However, due to severe diarrhea, her Revlimid  was discontinued in July 2020.  The patient had been doing well until her hemoglobin began to fall again in 2024.  Her second bone marrow biopsy in September 2024 revealed the persistence of myelodysplasia; it also showed absent iron  stores.  This led to her receiving IV iron  recently.  Labs also showed evidence of B12 deficiency for which she has taken a protracted course of B12 injections.  Despite receiving iron  and B12, her hemoglobin remained suboptimal to where she was started on Revlimid /luspatercept  in May 2025.  PHYSICAL EXAM:  There were no vitals taken for this visit. Wt Readings from Last 3 Encounters:  08/07/23 232 lb 11.2 oz (105.6 kg)  06/22/23 243 lb 8 oz (110.5 kg)  05/30/23 254 lb 1.9 oz (115.3 kg)   There is no height or weight on file to calculate BMI. Performance status (ECOG): 1 - Symptomatic but completely ambulatory Physical  Exam Constitutional:      Appearance: Normal appearance. She is not ill-appearing.  HENT:     Mouth/Throat:     Mouth: Mucous membranes are moist.     Pharynx: Oropharynx is clear. No oropharyngeal exudate or posterior oropharyngeal erythema.  Cardiovascular:     Rate and Rhythm: Normal rate and regular rhythm.     Heart sounds: No murmur heard.    No friction rub. No gallop.  Pulmonary:     Effort: Pulmonary effort is normal. No respiratory distress.     Breath sounds: Normal breath sounds. No wheezing, rhonchi or rales.  Abdominal:     General: Bowel sounds are normal. There is no distension.     Palpations: Abdomen is soft. There is no mass.     Tenderness: There is no abdominal tenderness.  Musculoskeletal:        General: No swelling.     Right lower leg: No edema.     Left lower leg: No edema.  Lymphadenopathy:     Cervical: No cervical adenopathy.     Upper Body:     Right upper body: No supraclavicular or axillary adenopathy.     Left upper body: No supraclavicular or axillary adenopathy.     Lower Body: No right inguinal adenopathy. No left inguinal adenopathy.  Skin:    General: Skin is warm.     Coloration: Skin is not jaundiced.     Findings: No lesion or rash.  Neurological:     General: No  focal deficit present.     Mental Status: She is alert and oriented to person, place, and time. Mental status is at baseline.  Psychiatric:        Mood and Affect: Mood normal.        Behavior: Behavior normal.        Thought Content: Thought content normal.    LABS:      Latest Ref Rng & Units 08/07/2023    9:10 AM 06/22/2023   11:03 AM 06/07/2023    8:29 AM  CBC  WBC 4.0 - 10.5 K/uL 3.3  4.1  5.5   Hemoglobin 12.0 - 15.0 g/dL 88.7  89.7  89.0   Hematocrit 36.0 - 46.0 % 37.2  33.0  34.9   Platelets 150 - 400 K/uL 193  259  390       Latest Ref Rng & Units 03/27/2023   10:25 AM 02/10/2023    3:28 PM 08/04/2022    1:05 PM  CMP  Glucose 70 - 99 mg/dL 848  868    BUN 8  - 23 mg/dL 10  12    Creatinine 9.55 - 1.00 mg/dL 8.75  8.51  8.79   Sodium 135 - 145 mmol/L 139  140    Potassium 3.5 - 5.1 mmol/L 3.1  4.1    Chloride 98 - 111 mmol/L 101  104    CO2 22 - 32 mmol/L 20  23    Calcium 8.9 - 10.3 mg/dL 9.1  9.2    Total Protein 6.5 - 8.1 g/dL 7.2  6.6    Total Bilirubin 0.0 - 1.2 mg/dL 0.4  0.3    Alkaline Phos 38 - 126 U/L 78  74    AST 15 - 41 U/L 19  19    ALT 0 - 44 U/L 10  11     ASSESSMENT & PLAN:  Assessment/Plan:  A 68 y.o. 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  I am pleased as her hemoglobin is even higher at 11.2 today.  For now, the patient will continue taking Revlimid /luspatercept  for her disease management.  If her diarrhea worsens, I may ultimately hold her Revlimid  for a few weeks to see if this symptom improves.  I still believe she needs to be evaluated by GI in the forthcoming weeks to ensure her diarrhea is not due to something more ominous.  Otherwise, I will tentatively see her back in 12 weeks for repeat clinical assessment.  The patient understands all the plans discussed today and is in agreement with them.  Jaysiah Marchetta DELENA Kerns, MD

## 2023-10-30 ENCOUNTER — Inpatient Hospital Stay: Admitting: Oncology

## 2023-10-30 ENCOUNTER — Inpatient Hospital Stay

## 2023-10-30 ENCOUNTER — Encounter: Payer: Self-pay | Admitting: Oncology

## 2023-10-30 ENCOUNTER — Other Ambulatory Visit: Payer: Self-pay | Admitting: Pharmacist

## 2023-10-30 ENCOUNTER — Telehealth: Payer: Self-pay

## 2023-10-30 DIAGNOSIS — D46C Myelodysplastic syndrome with isolated del(5q) chromosomal abnormality: Secondary | ICD-10-CM

## 2023-10-30 NOTE — Telephone Encounter (Signed)
 Patient called this morning to reschedule appointment with Dr. Ezzard stating she wasn't feeling well, feels like an URI and sore throat.  Recommended she call PCP,.

## 2023-11-02 NOTE — Progress Notes (Deleted)
 [facility]  [address] [place],  [place]  [address] [phone]  Clinic Day:  08/07/2023  Referring physician: Zachary Lamar BRAVO, NP   HISTORY OF PRESENT ILLNESS:  The patient is a 68 y.o. 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  As her hemoglobin fell below 10 again, Revlimid  5 mg every day has been restarted, in conjunction with Luspatercept .  She comes in today to check her hemoglobin to see how well she responded to this combination.  Over the past 2 weeks, the patient has complained of significant diarrhea.  She does not know why this symptom is occurring.  It has been discussed with her to undergo both a colonoscopy and upper endoscopy in the near future to rule out any GI tract abnormalities.  She does not believe her symptoms are related to either her Revlimid  or luspatercept  therapy..  This diagnosis was proven per cytogenetics done on her bone marrow biopsy in April 2019.  She briefly took Revlimid .  However, due to severe diarrhea, her Revlimid  was discontinued in July 2020.  The patient had been doing well until her hemoglobin began to fall again in 2024.  Her second bone marrow biopsy in September 2024 revealed the persistence of myelodysplasia; it also showed absent iron  stores.  This led to her receiving IV iron  recently.  Labs also showed evidence of B12 deficiency for which she has taken a protracted course of B12 injections.  Despite receiving iron  and B12, her hemoglobin remained suboptimal to where she was started on Revlimid /luspatercept  in May 2025.  PHYSICAL EXAM:  There were no vitals taken for this visit. Wt Readings from Last 3 Encounters:  08/07/23 232 lb 11.2 oz (105.6 kg)  06/22/23 243 lb 8 oz (110.5 kg)  05/30/23 254 lb 1.9 oz (115.3 kg)   There is no height or weight on file to calculate BMI. Performance status (ECOG): 1 - Symptomatic but completely ambulatory Physical  Exam Constitutional:      Appearance: Normal appearance. She is not ill-appearing.  HENT:     Mouth/Throat:     Mouth: Mucous membranes are moist.     Pharynx: Oropharynx is clear. No oropharyngeal exudate or posterior oropharyngeal erythema.  Cardiovascular:     Rate and Rhythm: Normal rate and regular rhythm.     Heart sounds: No murmur heard.    No friction rub. No gallop.  Pulmonary:     Effort: Pulmonary effort is normal. No respiratory distress.     Breath sounds: Normal breath sounds. No wheezing, rhonchi or rales.  Abdominal:     General: Bowel sounds are normal. There is no distension.     Palpations: Abdomen is soft. There is no mass.     Tenderness: There is no abdominal tenderness.  Musculoskeletal:        General: No swelling.     Right lower leg: No edema.     Left lower leg: No edema.  Lymphadenopathy:     Cervical: No cervical adenopathy.     Upper Body:     Right upper body: No supraclavicular or axillary adenopathy.     Left upper body: No supraclavicular or axillary adenopathy.     Lower Body: No right inguinal adenopathy. No left inguinal adenopathy.  Skin:    General: Skin is warm.     Coloration: Skin is not jaundiced.     Findings: No lesion or rash.  Neurological:     General: No  focal deficit present.     Mental Status: She is alert and oriented to person, place, and time. Mental status is at baseline.  Psychiatric:        Mood and Affect: Mood normal.        Behavior: Behavior normal.        Thought Content: Thought content normal.    LABS:      Latest Ref Rng & Units 08/07/2023    9:10 AM 06/22/2023   11:03 AM 06/07/2023    8:29 AM  CBC  WBC 4.0 - 10.5 K/uL 3.3  4.1  5.5   Hemoglobin 12.0 - 15.0 g/dL 88.7  89.7  89.0   Hematocrit 36.0 - 46.0 % 37.2  33.0  34.9   Platelets 150 - 400 K/uL 193  259  390       Latest Ref Rng & Units 03/27/2023   10:25 AM 02/10/2023    3:28 PM 08/04/2022    1:05 PM  CMP  Glucose 70 - 99 mg/dL 848  868    BUN 8  - 23 mg/dL 10  12    Creatinine 9.55 - 1.00 mg/dL 8.75  8.51  8.79   Sodium 135 - 145 mmol/L 139  140    Potassium 3.5 - 5.1 mmol/L 3.1  4.1    Chloride 98 - 111 mmol/L 101  104    CO2 22 - 32 mmol/L 20  23    Calcium 8.9 - 10.3 mg/dL 9.1  9.2    Total Protein 6.5 - 8.1 g/dL 7.2  6.6    Total Bilirubin 0.0 - 1.2 mg/dL 0.4  0.3    Alkaline Phos 38 - 126 U/L 78  74    AST 15 - 41 U/L 19  19    ALT 0 - 44 U/L 10  11     ASSESSMENT & PLAN:  Assessment/Plan:  A 68 y.o. 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  I am pleased as her hemoglobin is even higher at 11.2 today.  For now, the patient will continue taking Revlimid /luspatercept  for her disease management.  If her diarrhea worsens, I may ultimately hold her Revlimid  for a few weeks to see if this symptom improves.  I still believe she needs to be evaluated by GI in the forthcoming weeks to ensure her diarrhea is not due to something more ominous.  Otherwise, I will tentatively see her back in 12 weeks for repeat clinical assessment.  The patient understands all the plans discussed today and is in agreement with them.  Nestor Wieneke DELENA Kerns, MD

## 2023-11-03 ENCOUNTER — Inpatient Hospital Stay

## 2023-11-03 ENCOUNTER — Inpatient Hospital Stay: Admitting: Oncology

## 2023-11-05 NOTE — Progress Notes (Deleted)
 [facility]  [address] [place],  [place]  [address] [phone]  Clinic Day:  08/07/2023  Referring physician: Zachary Lamar BRAVO, NP   HISTORY OF PRESENT ILLNESS:  The patient is a 68 y.o. 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  As her hemoglobin fell below 10 again, Revlimid  5 mg every day has been restarted, in conjunction with Luspatercept .  She comes in today to check her hemoglobin to see how well she responded to this combination.  Over the past 2 weeks, the patient has complained of significant diarrhea.  She does not know why this symptom is occurring.  It has been discussed with her to undergo both a colonoscopy and upper endoscopy in the near future to rule out any GI tract abnormalities.  She does not believe her symptoms are related to either her Revlimid  or luspatercept  therapy..  This diagnosis was proven per cytogenetics done on her bone marrow biopsy in April 2019.  She briefly took Revlimid .  However, due to severe diarrhea, her Revlimid  was discontinued in July 2020.  The patient had been doing well until her hemoglobin began to fall again in 2024.  Her second bone marrow biopsy in September 2024 revealed the persistence of myelodysplasia; it also showed absent iron  stores.  This led to her receiving IV iron  recently.  Labs also showed evidence of B12 deficiency for which she has taken a protracted course of B12 injections.  Despite receiving iron  and B12, her hemoglobin remained suboptimal to where she was started on Revlimid /luspatercept  in May 2025.  PHYSICAL EXAM:  There were no vitals taken for this visit. Wt Readings from Last 3 Encounters:  08/07/23 232 lb 11.2 oz (105.6 kg)  06/22/23 243 lb 8 oz (110.5 kg)  05/30/23 254 lb 1.9 oz (115.3 kg)   There is no height or weight on file to calculate BMI. Performance status (ECOG): 1 - Symptomatic but completely ambulatory Physical  Exam Constitutional:      Appearance: Normal appearance. She is not ill-appearing.  HENT:     Mouth/Throat:     Mouth: Mucous membranes are moist.     Pharynx: Oropharynx is clear. No oropharyngeal exudate or posterior oropharyngeal erythema.  Cardiovascular:     Rate and Rhythm: Normal rate and regular rhythm.     Heart sounds: No murmur heard.    No friction rub. No gallop.  Pulmonary:     Effort: Pulmonary effort is normal. No respiratory distress.     Breath sounds: Normal breath sounds. No wheezing, rhonchi or rales.  Abdominal:     General: Bowel sounds are normal. There is no distension.     Palpations: Abdomen is soft. There is no mass.     Tenderness: There is no abdominal tenderness.  Musculoskeletal:        General: No swelling.     Right lower leg: No edema.     Left lower leg: No edema.  Lymphadenopathy:     Cervical: No cervical adenopathy.     Upper Body:     Right upper body: No supraclavicular or axillary adenopathy.     Left upper body: No supraclavicular or axillary adenopathy.     Lower Body: No right inguinal adenopathy. No left inguinal adenopathy.  Skin:    General: Skin is warm.     Coloration: Skin is not jaundiced.     Findings: No lesion or rash.  Neurological:     General: No  focal deficit present.     Mental Status: She is alert and oriented to person, place, and time. Mental status is at baseline.  Psychiatric:        Mood and Affect: Mood normal.        Behavior: Behavior normal.        Thought Content: Thought content normal.    LABS:      Latest Ref Rng & Units 08/07/2023    9:10 AM 06/22/2023   11:03 AM 06/07/2023    8:29 AM  CBC  WBC 4.0 - 10.5 K/uL 3.3  4.1  5.5   Hemoglobin 12.0 - 15.0 g/dL 88.7  89.7  89.0   Hematocrit 36.0 - 46.0 % 37.2  33.0  34.9   Platelets 150 - 400 K/uL 193  259  390       Latest Ref Rng & Units 03/27/2023   10:25 AM 02/10/2023    3:28 PM 08/04/2022    1:05 PM  CMP  Glucose 70 - 99 mg/dL 848  868    BUN 8  - 23 mg/dL 10  12    Creatinine 9.55 - 1.00 mg/dL 8.75  8.51  8.79   Sodium 135 - 145 mmol/L 139  140    Potassium 3.5 - 5.1 mmol/L 3.1  4.1    Chloride 98 - 111 mmol/L 101  104    CO2 22 - 32 mmol/L 20  23    Calcium 8.9 - 10.3 mg/dL 9.1  9.2    Total Protein 6.5 - 8.1 g/dL 7.2  6.6    Total Bilirubin 0.0 - 1.2 mg/dL 0.4  0.3    Alkaline Phos 38 - 126 U/L 78  74    AST 15 - 41 U/L 19  19    ALT 0 - 44 U/L 10  11     ASSESSMENT & PLAN:  Assessment/Plan:  A 68 y.o. 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  I am pleased as her hemoglobin is even higher at 11.2 today.  For now, the patient will continue taking Revlimid /luspatercept  for her disease management.  If her diarrhea worsens, I may ultimately hold her Revlimid  for a few weeks to see if this symptom improves.  I still believe she needs to be evaluated by GI in the forthcoming weeks to ensure her diarrhea is not due to something more ominous.  Otherwise, I will tentatively see her back in 12 weeks for repeat clinical assessment.  The patient understands all the plans discussed today and is in agreement with them.  Nestor Wieneke DELENA Kerns, MD

## 2023-11-06 ENCOUNTER — Encounter: Payer: Self-pay | Admitting: Oncology

## 2023-11-06 ENCOUNTER — Inpatient Hospital Stay

## 2023-11-06 ENCOUNTER — Inpatient Hospital Stay: Admitting: Oncology

## 2023-11-07 ENCOUNTER — Other Ambulatory Visit: Payer: Self-pay

## 2023-11-07 ENCOUNTER — Inpatient Hospital Stay: Attending: Oncology | Admitting: Dietician

## 2023-11-07 NOTE — Progress Notes (Signed)
 Nutrition Follow Up:  Called patient at home telephone for remote follow up.  Canceled appointments at cancer center as she was not feeling well again.  Hasn't seen new GI MD yet, has appointment this month.   Patient reports she isn't eating much and she can't force herself to eat. Had upper respiratory infection and not moving much, also reports not sleeping well. Bowels regular.  She reports she has no pain with eating and no n/v but Just doesn't want it. Made some chili, had 3-4 spoonfuls then can't eat. Tried a variety of protein chicken, deviled eggs, beef.   Won't consider any protein drinks, thought makes her sick. Fluids: Water 4-5 glasses and  regular soda 2 glasses, orange juice sometimes, doesn't really like milk or yogurts.   Anthropometrics:  weight loss 36# (14%) past 5 months which is significant for timeframe.  Height: 62 Weight:  10/13/23 218# (at PCP) 08/07/23  232.7# 06/22/23  243.5# 05/30/23  254# UBW: 250#   NUTRITION DIAGNOSIS: Inadequate PO intake to meet increased nutrient needs, r/t cancer diagnosis. Continues   INTERVENTION:   Encouraged more food with a liquid base to monitor tolerance and intake.  Encouraged continued frequent feeds and protein pacing eating every 2-3 hours.  Will send email to [email or web address] for  high protein/high calorie pudding recipe, and give non-creamy samples to CMA to give to patient next week when coming to see MD.  MONITORING, EVALUATION, GOAL: weight, PO intake, Nutrition Impact Symptoms, labs  Next Visit: remote  next week after Labs and MD follow up to monitor progress  Micheline Craven, RDN, LDN Registered Dietitian, [facility] Part Time Remote (Usual office hours: Tuesday-Thursday) Cell: [phone]

## 2023-11-14 ENCOUNTER — Inpatient Hospital Stay: Admitting: Oncology

## 2023-11-14 ENCOUNTER — Encounter: Payer: Self-pay | Admitting: Oncology

## 2023-11-14 ENCOUNTER — Inpatient Hospital Stay

## 2023-11-14 ENCOUNTER — Telehealth: Payer: Self-pay | Admitting: Oncology

## 2023-11-14 NOTE — Progress Notes (Deleted)
 [facility]  [address] [place],  [place]  [address] [phone]  Clinic Day:  11/14/2023  Referring physician: Zachary Lamar BRAVO, NP   HISTORY OF PRESENT ILLNESS:  The patient is a 68 y.o. 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  As her hemoglobin fell below 10 again, Revlimid  5 mg every day has been restarted, in conjunction with Luspatercept .  She comes in today to check her hemoglobin to see how well she responded to this combination.  Over the past 2 weeks, the patient has complained of significant diarrhea.  She does not know why this symptom is occurring.  It has been discussed with her to undergo both a colonoscopy and upper endoscopy in the near future to rule out any GI tract abnormalities.  She does not believe her symptoms are related to either her Revlimid  or luspatercept  therapy..  This diagnosis was proven per cytogenetics done on her bone marrow biopsy in April 2019.  She briefly took Revlimid .  However, due to severe diarrhea, her Revlimid  was discontinued in July 2020.  The patient had been doing well until her hemoglobin began to fall again in 2024.  Her second bone marrow biopsy in September 2024 revealed the persistence of myelodysplasia; it also showed absent iron  stores.  This led to her receiving IV iron  recently.  Labs also showed evidence of B12 deficiency for which she has taken a protracted course of B12 injections.  Despite receiving iron  and B12, her hemoglobin remained suboptimal to where she was started on Revlimid /luspatercept  in May 2025.  PHYSICAL EXAM:  There were no vitals taken for this visit. Wt Readings from Last 3 Encounters:  08/07/23 232 lb 11.2 oz (105.6 kg)  06/22/23 243 lb 8 oz (110.5 kg)  05/30/23 254 lb 1.9 oz (115.3 kg)   There is no height or weight on file to calculate BMI. Performance status (ECOG): 1 - Symptomatic but completely ambulatory Physical  Exam Constitutional:      Appearance: Normal appearance. She is not ill-appearing.  HENT:     Mouth/Throat:     Mouth: Mucous membranes are moist.     Pharynx: Oropharynx is clear. No oropharyngeal exudate or posterior oropharyngeal erythema.  Cardiovascular:     Rate and Rhythm: Normal rate and regular rhythm.     Heart sounds: No murmur heard.    No friction rub. No gallop.  Pulmonary:     Effort: Pulmonary effort is normal. No respiratory distress.     Breath sounds: Normal breath sounds. No wheezing, rhonchi or rales.  Abdominal:     General: Bowel sounds are normal. There is no distension.     Palpations: Abdomen is soft. There is no mass.     Tenderness: There is no abdominal tenderness.  Musculoskeletal:        General: No swelling.     Right lower leg: No edema.     Left lower leg: No edema.  Lymphadenopathy:     Cervical: No cervical adenopathy.     Upper Body:     Right upper body: No supraclavicular or axillary adenopathy.     Left upper body: No supraclavicular or axillary adenopathy.     Lower Body: No right inguinal adenopathy. No left inguinal adenopathy.  Skin:    General: Skin is warm.     Coloration: Skin is not jaundiced.     Findings: No lesion or rash.  Neurological:     General: No  focal deficit present.     Mental Status: She is alert and oriented to person, place, and time. Mental status is at baseline.  Psychiatric:        Mood and Affect: Mood normal.        Behavior: Behavior normal.        Thought Content: Thought content normal.    LABS:      Latest Ref Rng & Units 08/07/2023    9:10 AM 06/22/2023   11:03 AM 06/07/2023    8:29 AM  CBC  WBC 4.0 - 10.5 K/uL 3.3  4.1  5.5   Hemoglobin 12.0 - 15.0 g/dL 88.7  89.7  89.0   Hematocrit 36.0 - 46.0 % 37.2  33.0  34.9   Platelets 150 - 400 K/uL 193  259  390       Latest Ref Rng & Units 03/27/2023   10:25 AM 02/10/2023    3:28 PM 08/04/2022    1:05 PM  CMP  Glucose 70 - 99 mg/dL 848  868    BUN 8  - 23 mg/dL 10  12    Creatinine 9.55 - 1.00 mg/dL 8.75  8.51  8.79   Sodium 135 - 145 mmol/L 139  140    Potassium 3.5 - 5.1 mmol/L 3.1  4.1    Chloride 98 - 111 mmol/L 101  104    CO2 22 - 32 mmol/L 20  23    Calcium 8.9 - 10.3 mg/dL 9.1  9.2    Total Protein 6.5 - 8.1 g/dL 7.2  6.6    Total Bilirubin 0.0 - 1.2 mg/dL 0.4  0.3    Alkaline Phos 38 - 126 U/L 78  74    AST 15 - 41 U/L 19  19    ALT 0 - 44 U/L 10  11     ASSESSMENT & PLAN:  Assessment/Plan:  A 68 y.o. 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  I am pleased as her hemoglobin is even higher at 11.2 today.  For now, the patient will continue taking Revlimid /luspatercept  for her disease management.  If her diarrhea worsens, I may ultimately hold her Revlimid  for a few weeks to see if this symptom improves.  I still believe she needs to be evaluated by GI in the forthcoming weeks to ensure her diarrhea is not due to something more ominous.  Otherwise, I will tentatively see her back in 12 weeks for repeat clinical assessment.  The patient understands all the plans discussed today and is in agreement with them.  Imogene Gravelle DELENA Kerns, MD

## 2023-11-14 NOTE — Progress Notes (Incomplete)
 [facility]  [address] [place],  [place]  [address] [phone]  Clinic Day:  11/14/2023  Referring physician: Zachary Lamar BRAVO, NP   HISTORY OF PRESENT ILLNESS:  The patient is a 68 y.o. 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  As her hemoglobin fell below 10 again, Revlimid  5 mg every day has been restarted, in conjunction with Luspatercept .  She comes in today to check her hemoglobin to see how well she responded to this combination.  Over the past 2 weeks, the patient has complained of significant diarrhea.  She does not know why this symptom is occurring.  It has been discussed with her to undergo both a colonoscopy and upper endoscopy in the near future to rule out any GI tract abnormalities.  She does not believe her symptoms are related to either her Revlimid  or luspatercept  therapy..  This diagnosis was proven per cytogenetics done on her bone marrow biopsy in April 2019.  She briefly took Revlimid .  However, due to severe diarrhea, her Revlimid  was discontinued in July 2020.  The patient had been doing well until her hemoglobin began to fall again in 2024.  Her second bone marrow biopsy in September 2024 revealed the persistence of myelodysplasia; it also showed absent iron  stores.  This led to her receiving IV iron  recently.  Labs also showed evidence of B12 deficiency for which she has taken a protracted course of B12 injections.  Despite receiving iron  and B12, her hemoglobin remained suboptimal to where she was started on Revlimid /luspatercept  in May 2025.  PHYSICAL EXAM:  There were no vitals taken for this visit. Wt Readings from Last 3 Encounters:  08/07/23 232 lb 11.2 oz (105.6 kg)  06/22/23 243 lb 8 oz (110.5 kg)  05/30/23 254 lb 1.9 oz (115.3 kg)   There is no height or weight on file to calculate BMI. Performance status (ECOG): 1 - Symptomatic but completely ambulatory Physical  Exam Constitutional:      Appearance: Normal appearance. She is not ill-appearing.  HENT:     Mouth/Throat:     Mouth: Mucous membranes are moist.     Pharynx: Oropharynx is clear. No oropharyngeal exudate or posterior oropharyngeal erythema.  Cardiovascular:     Rate and Rhythm: Normal rate and regular rhythm.     Heart sounds: No murmur heard.    No friction rub. No gallop.  Pulmonary:     Effort: Pulmonary effort is normal. No respiratory distress.     Breath sounds: Normal breath sounds. No wheezing, rhonchi or rales.  Abdominal:     General: Bowel sounds are normal. There is no distension.     Palpations: Abdomen is soft. There is no mass.     Tenderness: There is no abdominal tenderness.  Musculoskeletal:        General: No swelling.     Right lower leg: No edema.     Left lower leg: No edema.  Lymphadenopathy:     Cervical: No cervical adenopathy.     Upper Body:     Right upper body: No supraclavicular or axillary adenopathy.     Left upper body: No supraclavicular or axillary adenopathy.     Lower Body: No right inguinal adenopathy. No left inguinal adenopathy.  Skin:    General: Skin is warm.     Coloration: Skin is not jaundiced.     Findings: No lesion or rash.  Neurological:     General: No  focal deficit present.     Mental Status: She is alert and oriented to person, place, and time. Mental status is at baseline.  Psychiatric:        Mood and Affect: Mood normal.        Behavior: Behavior normal.        Thought Content: Thought content normal.    LABS:      Latest Ref Rng & Units 08/07/2023    9:10 AM 06/22/2023   11:03 AM 06/07/2023    8:29 AM  CBC  WBC 4.0 - 10.5 K/uL 3.3  4.1  5.5   Hemoglobin 12.0 - 15.0 g/dL 88.7  89.7  89.0   Hematocrit 36.0 - 46.0 % 37.2  33.0  34.9   Platelets 150 - 400 K/uL 193  259  390       Latest Ref Rng & Units 03/27/2023   10:25 AM 02/10/2023    3:28 PM 08/04/2022    1:05 PM  CMP  Glucose 70 - 99 mg/dL 848  868    BUN 8  - 23 mg/dL 10  12    Creatinine 9.55 - 1.00 mg/dL 8.75  8.51  8.79   Sodium 135 - 145 mmol/L 139  140    Potassium 3.5 - 5.1 mmol/L 3.1  4.1    Chloride 98 - 111 mmol/L 101  104    CO2 22 - 32 mmol/L 20  23    Calcium 8.9 - 10.3 mg/dL 9.1  9.2    Total Protein 6.5 - 8.1 g/dL 7.2  6.6    Total Bilirubin 0.0 - 1.2 mg/dL 0.4  0.3    Alkaline Phos 38 - 126 U/L 78  74    AST 15 - 41 U/L 19  19    ALT 0 - 44 U/L 10  11     ASSESSMENT & PLAN:  Assessment/Plan:  A 68 y.o. 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  I am pleased as her hemoglobin is even higher at 11.2 today.  For now, the patient will continue taking Revlimid /luspatercept  for her disease management.  If her diarrhea worsens, I may ultimately hold her Revlimid  for a few weeks to see if this symptom improves.  I still believe she needs to be evaluated by GI in the forthcoming weeks to ensure her diarrhea is not due to something more ominous.  Otherwise, I will tentatively see her back in 12 weeks for repeat clinical assessment.  The patient understands all the plans discussed today and is in agreement with them.  [name] DELENA Kerns, MD

## 2023-11-14 NOTE — Telephone Encounter (Signed)
 11/14/23 Patient rescheduled appts.States she is not feeling well and dizzy.

## 2023-11-14 NOTE — Progress Notes (Deleted)
 [facility]  [address] [place],  [place]  [address] [phone]  Clinic Day:  11/14/2023  Referring physician: Zachary Lamar BRAVO, NP   HISTORY OF PRESENT ILLNESS:  The patient is a 68 y.o. 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  As her hemoglobin fell below 10 again, Revlimid  5 mg every day has been restarted, in conjunction with Luspatercept .  She comes in today to check her hemoglobin to see how well she responded to this combination.  Over the past 2 weeks, the patient has complained of significant diarrhea.  She does not know why this symptom is occurring.  It has been discussed with her to undergo both a colonoscopy and upper endoscopy in the near future to rule out any GI tract abnormalities.  She does not believe her symptoms are related to either her Revlimid  or luspatercept  therapy..  This diagnosis was proven per cytogenetics done on her bone marrow biopsy in April 2019.  She briefly took Revlimid .  However, due to severe diarrhea, her Revlimid  was discontinued in July 2020.  The patient had been doing well until her hemoglobin began to fall again in 2024.  Her second bone marrow biopsy in September 2024 revealed the persistence of myelodysplasia; it also showed absent iron  stores.  This led to her receiving IV iron  recently.  Labs also showed evidence of B12 deficiency for which she has taken a protracted course of B12 injections.  Despite receiving iron  and B12, her hemoglobin remained suboptimal to where she was started on Revlimid /luspatercept  in May 2025.  PHYSICAL EXAM:  There were no vitals taken for this visit. Wt Readings from Last 3 Encounters:  08/07/23 232 lb 11.2 oz (105.6 kg)  06/22/23 243 lb 8 oz (110.5 kg)  05/30/23 254 lb 1.9 oz (115.3 kg)   There is no height or weight on file to calculate BMI. Performance status (ECOG): 1 - Symptomatic but completely ambulatory Physical  Exam Constitutional:      Appearance: Normal appearance. She is not ill-appearing.  HENT:     Mouth/Throat:     Mouth: Mucous membranes are moist.     Pharynx: Oropharynx is clear. No oropharyngeal exudate or posterior oropharyngeal erythema.  Cardiovascular:     Rate and Rhythm: Normal rate and regular rhythm.     Heart sounds: No murmur heard.    No friction rub. No gallop.  Pulmonary:     Effort: Pulmonary effort is normal. No respiratory distress.     Breath sounds: Normal breath sounds. No wheezing, rhonchi or rales.  Abdominal:     General: Bowel sounds are normal. There is no distension.     Palpations: Abdomen is soft. There is no mass.     Tenderness: There is no abdominal tenderness.  Musculoskeletal:        General: No swelling.     Right lower leg: No edema.     Left lower leg: No edema.  Lymphadenopathy:     Cervical: No cervical adenopathy.     Upper Body:     Right upper body: No supraclavicular or axillary adenopathy.     Left upper body: No supraclavicular or axillary adenopathy.     Lower Body: No right inguinal adenopathy. No left inguinal adenopathy.  Skin:    General: Skin is warm.     Coloration: Skin is not jaundiced.     Findings: No lesion or rash.  Neurological:     General: No  focal deficit present.     Mental Status: She is alert and oriented to person, place, and time. Mental status is at baseline.  Psychiatric:        Mood and Affect: Mood normal.        Behavior: Behavior normal.        Thought Content: Thought content normal.    LABS:      Latest Ref Rng & Units 08/07/2023    9:10 AM 06/22/2023   11:03 AM 06/07/2023    8:29 AM  CBC  WBC 4.0 - 10.5 K/uL 3.3  4.1  5.5   Hemoglobin 12.0 - 15.0 g/dL 88.7  89.7  89.0   Hematocrit 36.0 - 46.0 % 37.2  33.0  34.9   Platelets 150 - 400 K/uL 193  259  390       Latest Ref Rng & Units 03/27/2023   10:25 AM 02/10/2023    3:28 PM 08/04/2022    1:05 PM  CMP  Glucose 70 - 99 mg/dL 848  868    BUN 8  - 23 mg/dL 10  12    Creatinine 9.55 - 1.00 mg/dL 8.75  8.51  8.79   Sodium 135 - 145 mmol/L 139  140    Potassium 3.5 - 5.1 mmol/L 3.1  4.1    Chloride 98 - 111 mmol/L 101  104    CO2 22 - 32 mmol/L 20  23    Calcium 8.9 - 10.3 mg/dL 9.1  9.2    Total Protein 6.5 - 8.1 g/dL 7.2  6.6    Total Bilirubin 0.0 - 1.2 mg/dL 0.4  0.3    Alkaline Phos 38 - 126 U/L 78  74    AST 15 - 41 U/L 19  19    ALT 0 - 44 U/L 10  11     ASSESSMENT & PLAN:  Assessment/Plan:  A 68 y.o. 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  I am pleased as her hemoglobin is even higher at 11.2 today.  For now, the patient will continue taking Revlimid /luspatercept  for her disease management.  If her diarrhea worsens, I may ultimately hold her Revlimid  for a few weeks to see if this symptom improves.  I still believe she needs to be evaluated by GI in the forthcoming weeks to ensure her diarrhea is not due to something more ominous.  Otherwise, I will tentatively see her back in 12 weeks for repeat clinical assessment.  The patient understands all the plans discussed today and is in agreement with them.  [name] DELENA Kerns, MD

## 2023-11-15 ENCOUNTER — Other Ambulatory Visit: Payer: Self-pay

## 2023-11-15 ENCOUNTER — Inpatient Hospital Stay

## 2023-11-15 ENCOUNTER — Inpatient Hospital Stay: Admitting: Oncology

## 2023-11-18 ENCOUNTER — Other Ambulatory Visit: Payer: Self-pay | Admitting: Hematology and Oncology

## 2023-11-18 DIAGNOSIS — D46C Myelodysplastic syndrome with isolated del(5q) chromosomal abnormality: Secondary | ICD-10-CM

## 2023-11-20 ENCOUNTER — Encounter: Payer: Self-pay | Admitting: Oncology

## 2023-11-21 ENCOUNTER — Inpatient Hospital Stay

## 2023-11-21 ENCOUNTER — Emergency Department (HOSPITAL_COMMUNITY)

## 2023-11-21 ENCOUNTER — Inpatient Hospital Stay (HOSPITAL_COMMUNITY)
Admission: EM | Admit: 2023-11-21 | Discharge: 2023-11-25 | DRG: 372 | Disposition: A | Discharge status: Home / Self Care | Attending: Family Medicine | Admitting: Family Medicine

## 2023-11-21 ENCOUNTER — Inpatient Hospital Stay: Admitting: Hematology and Oncology

## 2023-11-21 ENCOUNTER — Encounter (HOSPITAL_COMMUNITY): Payer: Self-pay

## 2023-11-21 ENCOUNTER — Other Ambulatory Visit: Payer: Self-pay

## 2023-11-21 ENCOUNTER — Telehealth: Payer: Self-pay

## 2023-11-21 DIAGNOSIS — E785 Hyperlipidemia, unspecified: Secondary | ICD-10-CM | POA: Diagnosis present

## 2023-11-21 DIAGNOSIS — R197 Diarrhea, unspecified: Secondary | ICD-10-CM | POA: Insufficient documentation

## 2023-11-21 DIAGNOSIS — E878 Other disorders of electrolyte and fluid balance, not elsewhere classified: Secondary | ICD-10-CM | POA: Diagnosis not present

## 2023-11-21 DIAGNOSIS — F411 Generalized anxiety disorder: Secondary | ICD-10-CM | POA: Diagnosis present

## 2023-11-21 DIAGNOSIS — Z1152 Encounter for screening for COVID-19: Secondary | ICD-10-CM

## 2023-11-21 DIAGNOSIS — D469 Myelodysplastic syndrome, unspecified: Secondary | ICD-10-CM | POA: Diagnosis present

## 2023-11-21 DIAGNOSIS — I129 Hypertensive chronic kidney disease with stage 1 through stage 4 chronic kidney disease, or unspecified chronic kidney disease: Secondary | ICD-10-CM | POA: Diagnosis present

## 2023-11-21 DIAGNOSIS — N390 Urinary tract infection, site not specified: Secondary | ICD-10-CM | POA: Diagnosis present

## 2023-11-21 DIAGNOSIS — E43 Unspecified severe protein-calorie malnutrition: Secondary | ICD-10-CM | POA: Clinically undetermined

## 2023-11-21 DIAGNOSIS — I2510 Atherosclerotic heart disease of native coronary artery without angina pectoris: Secondary | ICD-10-CM | POA: Diagnosis present

## 2023-11-21 DIAGNOSIS — I69352 Hemiplegia and hemiparesis following cerebral infarction affecting left dominant side: Secondary | ICD-10-CM | POA: Diagnosis not present

## 2023-11-21 DIAGNOSIS — R9431 Abnormal electrocardiogram [ECG] [EKG]: Secondary | ICD-10-CM | POA: Insufficient documentation

## 2023-11-21 DIAGNOSIS — D709 Neutropenia, unspecified: Secondary | ICD-10-CM | POA: Diagnosis present

## 2023-11-21 DIAGNOSIS — Z789 Other specified health status: Secondary | ICD-10-CM

## 2023-11-21 DIAGNOSIS — I69392 Facial weakness following cerebral infarction: Secondary | ICD-10-CM | POA: Diagnosis not present

## 2023-11-21 DIAGNOSIS — E8779 Other fluid overload: Secondary | ICD-10-CM | POA: Diagnosis not present

## 2023-11-21 DIAGNOSIS — E669 Obesity, unspecified: Secondary | ICD-10-CM | POA: Diagnosis present

## 2023-11-21 DIAGNOSIS — Z7901 Long term (current) use of anticoagulants: Secondary | ICD-10-CM

## 2023-11-21 DIAGNOSIS — K219 Gastro-esophageal reflux disease without esophagitis: Secondary | ICD-10-CM | POA: Diagnosis present

## 2023-11-21 DIAGNOSIS — E876 Hypokalemia: Principal | ICD-10-CM | POA: Diagnosis present

## 2023-11-21 DIAGNOSIS — F329 Major depressive disorder, single episode, unspecified: Secondary | ICD-10-CM | POA: Diagnosis present

## 2023-11-21 DIAGNOSIS — A0472 Enterocolitis due to Clostridium difficile, not specified as recurrent: Secondary | ICD-10-CM | POA: Diagnosis present

## 2023-11-21 DIAGNOSIS — M545 Low back pain, unspecified: Secondary | ICD-10-CM | POA: Insufficient documentation

## 2023-11-21 DIAGNOSIS — D630 Anemia in neoplastic disease: Secondary | ICD-10-CM | POA: Diagnosis present

## 2023-11-21 DIAGNOSIS — J302 Other seasonal allergic rhinitis: Secondary | ICD-10-CM | POA: Diagnosis present

## 2023-11-21 DIAGNOSIS — Z91013 Allergy to seafood: Secondary | ICD-10-CM

## 2023-11-21 DIAGNOSIS — Z23 Encounter for immunization: Secondary | ICD-10-CM

## 2023-11-21 DIAGNOSIS — Z6836 Body mass index (BMI) 36.0-36.9, adult: Secondary | ICD-10-CM

## 2023-11-21 DIAGNOSIS — R634 Abnormal weight loss: Secondary | ICD-10-CM | POA: Diagnosis present

## 2023-11-21 DIAGNOSIS — Z79890 Hormone replacement therapy (postmenopausal): Secondary | ICD-10-CM

## 2023-11-21 DIAGNOSIS — N1831 Chronic kidney disease, stage 3a: Secondary | ICD-10-CM | POA: Diagnosis present

## 2023-11-21 DIAGNOSIS — E7800 Pure hypercholesterolemia, unspecified: Secondary | ICD-10-CM | POA: Diagnosis present

## 2023-11-21 DIAGNOSIS — Z79899 Other long term (current) drug therapy: Secondary | ICD-10-CM

## 2023-11-21 DIAGNOSIS — N200 Calculus of kidney: Secondary | ICD-10-CM | POA: Diagnosis present

## 2023-11-21 DIAGNOSIS — Z96651 Presence of right artificial knee joint: Secondary | ICD-10-CM | POA: Diagnosis present

## 2023-11-21 DIAGNOSIS — M109 Gout, unspecified: Secondary | ICD-10-CM | POA: Diagnosis present

## 2023-11-21 DIAGNOSIS — Z86711 Personal history of pulmonary embolism: Secondary | ICD-10-CM

## 2023-11-21 DIAGNOSIS — M797 Fibromyalgia: Secondary | ICD-10-CM | POA: Diagnosis present

## 2023-11-21 DIAGNOSIS — G8929 Other chronic pain: Secondary | ICD-10-CM | POA: Insufficient documentation

## 2023-11-21 DIAGNOSIS — R202 Paresthesia of skin: Secondary | ICD-10-CM | POA: Diagnosis not present

## 2023-11-21 DIAGNOSIS — Z91018 Allergy to other foods: Secondary | ICD-10-CM

## 2023-11-21 DIAGNOSIS — R7989 Other specified abnormal findings of blood chemistry: Secondary | ICD-10-CM | POA: Insufficient documentation

## 2023-11-21 DIAGNOSIS — Z9682 Presence of neurostimulator: Secondary | ICD-10-CM

## 2023-11-21 LAB — CBC WITH DIFFERENTIAL/PLATELET
Abs Immature Granulocytes: 0.01 K/uL (ref 0.00–0.07)
Basophils Absolute: 0.3 K/uL — ABNORMAL HIGH (ref 0.0–0.1)
Basophils Relative: 9 %
Eosinophils Absolute: 0.1 K/uL (ref 0.0–0.5)
Eosinophils Relative: 5 %
HCT: 36.3 % (ref 36.0–46.0)
Hemoglobin: 11.9 g/dL — ABNORMAL LOW (ref 12.0–15.0)
Immature Granulocytes: 0 %
Lymphocytes Relative: 47 %
Lymphs Abs: 1.3 K/uL (ref 0.7–4.0)
MCH: 27.7 pg (ref 26.0–34.0)
MCHC: 32.8 g/dL (ref 30.0–36.0)
MCV: 84.4 fL (ref 80.0–100.0)
Monocytes Absolute: 0.3 K/uL (ref 0.1–1.0)
Monocytes Relative: 11 %
Neutro Abs: 0.8 K/uL — ABNORMAL LOW (ref 1.7–7.7)
Neutrophils Relative %: 28 %
Platelets: 176 K/uL (ref 150–400)
RBC: 4.3 MIL/uL (ref 3.87–5.11)
RDW: 20.8 % — ABNORMAL HIGH (ref 11.5–15.5)
Smear Review: NORMAL
WBC: 2.7 K/uL — ABNORMAL LOW (ref 4.0–10.5)
nRBC: 0 % (ref 0.0–0.2)

## 2023-11-21 LAB — COMPREHENSIVE METABOLIC PANEL WITH GFR
ALT: 17 U/L (ref 0–44)
AST: 29 U/L (ref 15–41)
Albumin: 3.1 g/dL — ABNORMAL LOW (ref 3.5–5.0)
Alkaline Phosphatase: 75 U/L (ref 38–126)
Anion gap: 15 (ref 5–15)
BUN: <5 mg/dL — ABNORMAL LOW (ref 8–23)
CO2: 26 mmol/L (ref 22–32)
Calcium: 8.3 mg/dL — ABNORMAL LOW (ref 8.9–10.3)
Chloride: 96 mmol/L — ABNORMAL LOW (ref 98–111)
Creatinine, Ser: 1.16 mg/dL — ABNORMAL HIGH (ref 0.44–1.00)
GFR, Estimated: 52 mL/min — ABNORMAL LOW (ref >60)
Glucose, Bld: 117 mg/dL — ABNORMAL HIGH (ref 70–99)
Potassium: 2.5 mmol/L — CL (ref 3.5–5.1)
Sodium: 137 mmol/L (ref 135–145)
Total Bilirubin: 1 mg/dL (ref 0.0–1.2)
Total Protein: 6.7 g/dL (ref 6.5–8.1)

## 2023-11-21 LAB — BASIC METABOLIC PANEL WITH GFR
Anion gap: 14 (ref 5–15)
BUN: <5 mg/dL — ABNORMAL LOW (ref 8–23)
CO2: 27 mmol/L (ref 22–32)
Calcium: 8.1 mg/dL — ABNORMAL LOW (ref 8.9–10.3)
Chloride: 94 mmol/L — ABNORMAL LOW (ref 98–111)
Creatinine, Ser: 1.07 mg/dL — ABNORMAL HIGH (ref 0.44–1.00)
GFR, Estimated: 57 mL/min — ABNORMAL LOW (ref >60)
Glucose, Bld: 98 mg/dL (ref 70–99)
Potassium: 2.9 mmol/L — ABNORMAL LOW (ref 3.5–5.1)
Sodium: 135 mmol/L (ref 135–145)

## 2023-11-21 LAB — URINALYSIS, ROUTINE W REFLEX MICROSCOPIC
Bacteria, UA: NONE SEEN
Bilirubin Urine: NEGATIVE
Glucose, UA: NEGATIVE mg/dL
Ketones, ur: NEGATIVE mg/dL
Nitrite: NEGATIVE
Protein, ur: NEGATIVE mg/dL
Specific Gravity, Urine: 1.021 (ref 1.005–1.030)
pH: 6 (ref 5.0–8.0)

## 2023-11-21 LAB — TSH: TSH: 1.272 u[IU]/mL (ref 0.350–4.500)

## 2023-11-21 LAB — RAPID URINE DRUG SCREEN, HOSP PERFORMED
Amphetamines: NOT DETECTED
Barbiturates: NOT DETECTED
Benzodiazepines: NOT DETECTED
Cocaine: NOT DETECTED
Opiates: POSITIVE — AB
Tetrahydrocannabinol: NOT DETECTED

## 2023-11-21 LAB — TROPONIN I (HIGH SENSITIVITY): Troponin I (High Sensitivity): 24 ng/L — ABNORMAL HIGH (ref <18)

## 2023-11-21 LAB — MAGNESIUM
Magnesium: 1.5 mg/dL — ABNORMAL LOW (ref 1.7–2.4)
Magnesium: 2 mg/dL (ref 1.7–2.4)

## 2023-11-21 LAB — ETHANOL: Alcohol, Ethyl (B): <15 mg/dL (ref <15)

## 2023-11-21 LAB — RESP PANEL BY RT-PCR (RSV, FLU A&B, COVID)  RVPGX2
Influenza A by PCR: NEGATIVE
Influenza B by PCR: NEGATIVE
Resp Syncytial Virus by PCR: NEGATIVE
SARS Coronavirus 2 by RT PCR: NEGATIVE

## 2023-11-21 LAB — PHOSPHORUS: Phosphorus: 2.3 mg/dL — ABNORMAL LOW (ref 2.5–4.6)

## 2023-11-21 LAB — CBG MONITORING, ED: Glucose-Capillary: 116 mg/dL — ABNORMAL HIGH (ref 70–99)

## 2023-11-21 MED ORDER — POTASSIUM CHLORIDE CRYS ER 20 MEQ PO TBCR
40.0000 meq | EXTENDED_RELEASE_TABLET | Freq: Four times a day (QID) | ORAL | Status: AC
  Administered 2023-11-21: 20 meq via ORAL
  Administered 2023-11-22: 40 meq via ORAL
  Filled 2023-11-21 (×2): qty 2

## 2023-11-21 MED ORDER — ONDANSETRON HCL 4 MG/2ML IJ SOLN
4.0000 mg | Freq: Once | INTRAMUSCULAR | Status: AC
  Administered 2023-11-21: 4 mg via INTRAVENOUS
  Filled 2023-11-21: qty 2

## 2023-11-21 MED ORDER — PANTOPRAZOLE SODIUM 40 MG PO TBEC
40.0000 mg | DELAYED_RELEASE_TABLET | Freq: Every day | ORAL | Status: DC
  Administered 2023-11-21 – 2023-11-25 (×5): 40 mg via ORAL
  Filled 2023-11-21 (×5): qty 1

## 2023-11-21 MED ORDER — K PHOS MONO-SOD PHOS DI & MONO 155-852-130 MG PO TABS
250.0000 mg | ORAL_TABLET | Freq: Once | ORAL | Status: AC
  Administered 2023-11-21: 250 mg via ORAL
  Filled 2023-11-21: qty 1

## 2023-11-21 MED ORDER — MORPHINE SULFATE (PF) 4 MG/ML IV SOLN
4.0000 mg | Freq: Once | INTRAVENOUS | Status: AC
  Administered 2023-11-21: 4 mg via INTRAVENOUS
  Filled 2023-11-21: qty 1

## 2023-11-21 MED ORDER — HYDROCODONE-ACETAMINOPHEN 10-325 MG PO TABS: 1.0000 | ORAL_TABLET | Freq: Four times a day (QID) | ORAL | Status: DC | PRN

## 2023-11-21 MED ORDER — ACETAMINOPHEN 325 MG PO TABS: 650.0000 mg | ORAL_TABLET | Freq: Four times a day (QID) | ORAL | Status: DC | PRN

## 2023-11-21 MED ORDER — LACTATED RINGERS IV SOLN: INTRAVENOUS | Status: AC

## 2023-11-21 MED ORDER — RIVAROXABAN 20 MG PO TABS
20.0000 mg | ORAL_TABLET | Freq: Every day | ORAL | Status: DC
  Administered 2023-11-21 – 2023-11-24 (×4): 20 mg via ORAL
  Filled 2023-11-21 (×2): qty 1
  Filled 2023-11-21: qty 2
  Filled 2023-11-21 (×2): qty 1

## 2023-11-21 MED ORDER — POTASSIUM CHLORIDE 10 MEQ/100ML IV SOLN
10.0000 meq | INTRAVENOUS | Status: DC
  Administered 2023-11-21 – 2023-11-22 (×3): 10 meq via INTRAVENOUS
  Filled 2023-11-21 (×3): qty 100

## 2023-11-21 MED ORDER — LACTATED RINGERS IV BOLUS: 1000.0000 mL | Freq: Once | INTRAVENOUS | Status: DC

## 2023-11-21 MED ORDER — LACTATED RINGERS IV BOLUS
1000.0000 mL | Freq: Once | INTRAVENOUS | Status: AC
  Administered 2023-11-21: 1000 mL via INTRAVENOUS

## 2023-11-21 MED ORDER — ACETAMINOPHEN 650 MG RE SUPP: 650.0000 mg | Freq: Four times a day (QID) | RECTAL | Status: DC | PRN

## 2023-11-21 MED ORDER — GABAPENTIN 300 MG PO CAPS
300.0000 mg | ORAL_CAPSULE | Freq: Three times a day (TID) | ORAL | Status: DC
  Administered 2023-11-21 – 2023-11-25 (×11): 300 mg via ORAL
  Filled 2023-11-21 (×11): qty 1

## 2023-11-21 MED ORDER — TRIMETHOBENZAMIDE HCL 100 MG/ML IM SOLN: 200.0000 mg | Freq: Four times a day (QID) | INTRAMUSCULAR | Status: DC | PRN

## 2023-11-21 MED ORDER — WHITE PETROLATUM EX OINT: TOPICAL_OINTMENT | CUTANEOUS | Status: DC | PRN

## 2023-11-21 MED ORDER — MAGNESIUM SULFATE 2 GM/50ML IV SOLN
2.0000 g | Freq: Once | INTRAVENOUS | Status: AC
  Administered 2023-11-21: 2 g via INTRAVENOUS
  Filled 2023-11-21: qty 50

## 2023-11-21 MED ORDER — POLYVINYL ALCOHOL 1.4 % OP SOLN
1.0000 [drp] | OPHTHALMIC | Status: DC | PRN
  Administered 2023-11-22 (×2): 1 [drp] via OPHTHALMIC
  Filled 2023-11-21: qty 15

## 2023-11-21 MED ORDER — IOHEXOL 350 MG/ML SOLN
75.0000 mL | Freq: Once | INTRAVENOUS | Status: AC | PRN
  Administered 2023-11-21: 75 mL via INTRAVENOUS

## 2023-11-21 MED ORDER — FOLIC ACID 1 MG PO TABS
1.0000 mg | ORAL_TABLET | Freq: Every day | ORAL | Status: DC
  Administered 2023-11-21 – 2023-11-25 (×5): 1 mg via ORAL
  Filled 2023-11-21 (×5): qty 1

## 2023-11-21 MED ORDER — POTASSIUM CHLORIDE 10 MEQ/100ML IV SOLN
10.0000 meq | INTRAVENOUS | Status: AC
  Administered 2023-11-21 (×2): 10 meq via INTRAVENOUS
  Filled 2023-11-21: qty 100

## 2023-11-21 NOTE — ED Notes (Signed)
 EDP at [facility]

## 2023-11-21 NOTE — Assessment & Plan Note (Addendum)
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 Continue home Norco 10-325mg  q6h PRN for chronic back pain

## 2023-11-21 NOTE — ED Triage Notes (Signed)
 Pt bib ems from home, pt c.o left arm and cheek tremors that started around 1130 this morning.  Hx of stroke with left sided deficits with residual left sided facial droop.   Pt also c.o cough, nausea, diarrhea, poor appetite and congestion x1 month, taking zofran  at home.

## 2023-11-21 NOTE — Telephone Encounter (Signed)
 Husband called stating that [name] does not feel good and would like to receive IVF's today. Spoke with patient and she states: patient has called stating that she is weak, some nausea, sweating and just does not feel good. Message sent to Eleanor Bach, FNP-BC. Will give patient a call back.

## 2023-11-21 NOTE — Telephone Encounter (Signed)
 Called patient and notified her to be here for labs at 1230, infusion at 1300 and then see Melissa P, FNP-BC

## 2023-11-21 NOTE — Assessment & Plan Note (Addendum)
 K/Mg 2.5/1.5 on admission.  - Admit to FMTS telemetry under attending Dr. Anders - S/p 20mEq IV K and 2g IV Mg in ED  - re-dose Mag 2 g - give additional Klor tablet 40 mEq q6h for 2 doses + 40 mEq IV - S/p 1L LR bolus in ED - Begin mIVF at 150mL/hr LR - AM BMP, Mg, Phos; monitor for refeeding syndrome

## 2023-11-21 NOTE — Plan of Care (Signed)
 FMTS Brief Progress Note  S: To bedside for night rounds Currently denies concerns Left sided paraesthesias have improved Diarrhea has subsided Denies significant CP, SOB, abd pain, nausea/vomiting   O: BP 130/62   Pulse 72   Temp 98.1 F (36.7 C) (Oral)   Resp 15   Ht 5' 2 (1.575 m)   Wt 90.7 kg   SpO2 100%   BMI 36.58 kg/m    General: NAD, pleasant, able to participate in exam Respiratory: No respiratory distress, CTAB anteriorly. Abd: soft nondistended, mild periumbilical soreness to palpation, no rebound/guarding Skin: warm and dry, no rashes noted Psych: Normal affect and mood   A/P:  Hypokalemia, hypomagnesemia -Undergoing IV repletion tonight. Ordered recheck BMP and Mg for midnight, f/u in morning as well -EKG changes consistent with hypokalemia but with this and paraesthesias on presentation ordered troponin and repeat EKG per Dr. Anders -Avoid QT prolonging agents  Remainder of plan per H&P   - Orders reviewed. Labs for AM ordered, which was adjusted as needed.  - If condition changes, plan includes page primary team.   Romelle Booty, MD 11/21/2023, 7:20 PM PGY-3, [facility] Night Resident  Please page [phone] with questions.

## 2023-11-21 NOTE — ED Provider Notes (Signed)
 [place] EMERGENCY DEPARTMENT AT [facility] Provider Note   CSN: [id number] Arrival date & time: 11/21/23  1222     Patient presents with: Tremors, Nausea, Nasal Congestion, and Diarrhea   [name] is a 68 y.o. female.    Diarrhea Patient was with diarrhea.  Decreased oral intake.  Weakness.  States she has lost 50 pounds in the last 2 months.  History of myelodysplasia.  History of bile salt diarrhea.  Fatigue.  Has had IV fluids.  Has planned to see GI.  Is on Revlimid   Has had abdominal pain also.  Also feels pain in her throat.  Has had tremors on the left side at times also.  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    Past Medical History:  Diagnosis Date   Anemia    Anxiety    Arthritis    knees, elbows, back (06/22/2015)   Bell palsy    Chronic lower back pain    Colon polyps    Complication of anesthesia    hallucinations   Depression    Diarrhea    Disc degeneration, lumbar    Fibromyalgia    GERD (gastroesophageal reflux disease)    Gout    High cholesterol    History of blood transfusion 01/2014   related to anemia   History of kidney stones    Hypertension    Migraine headache    2-3 times/year (06/22/2015)   Morbid obesity (HCC)    Myelodysplasia (myelodysplastic syndrome) (HCC)    Pulmonary embolism (HCC) 01/2008; 03/2014   2; 1   Seasonal allergies    Stroke (HCC) 03/2014   denies residual on 06/22/2015   Tumor, thyroid  2010   x 3   Vitamin D deficiency    Wears glasses     Prior to Admission medications   Medication Sig Start Date End Date Taking? Authorizing Provider  allopurinol  (ZYLOPRIM ) 100 MG tablet Take 1 tablet by mouth daily.    [provider]  atenolol  (TENORMIN ) 50 MG tablet Take 50 mg by mouth at bedtime.  04/08/19   [provider]  buPROPion  (WELLBUTRIN  XL) 150 MG 24 hr tablet Take 150 mg by mouth daily. 03/24/21   [provider]  calcium carbonate (SUPER CALCIUM) 1500 (600 Ca) MG TABS tablet 1 tablet with meals 08/06/19   [provider]  colestipol  (COLESTID ) 1 g tablet Take 1 g by mouth in the morning, at noon, in the evening, and at bedtime.    [provider]  escitalopram (LEXAPRO) 10 MG tablet Take 10 mg by mouth daily. 04/21/21   [provider]  fluticasone  (FLONASE ) 50 MCG/ACT nasal spray Place 1 spray into both nostrils at bedtime.  01/03/19   [provider]  folic acid  (FOLVITE ) 1 MG tablet Take 1 mg by mouth daily.    [provider]  gabapentin  (NEURONTIN ) 600 MG tablet Take 600 mg by mouth 3 (three) times daily.    [provider]  HYDROcodone -acetaminophen  (NORCO) 10-325 MG tablet 1 tablet as needed 04/29/21   [provider]  ibandronate (BONIVA) 150 MG tablet 1 tablet 07/27/20   [provider]  lenalidomide  (REVLIMID ) 5 MG capsule TAKE 1 CAPSULE BY MOUTH DAILY  FOR 28 DAYS 11/20/23   Harl Setter A, NP  pantoprazole  (PROTONIX ) 40 MG tablet Take 40 mg by mouth daily. 07/27/23   [provider]  polyvinyl alcohol -povidone (REFRESH)  1.4-0.6 % ophthalmic solution Place 1-2 drops into both eyes as needed (for dry eyes).    [provider]  potassium chloride  (MICRO-K ) 10 MEQ CR capsule TAKE 1 CAPSULE BY MOUTH WITH FOOD DAILY    [provider]  progesterone (PROMETRIUM) 200 MG capsule 1 capsule    [provider]  rivaroxaban  (XARELTO ) 20 MG TABS tablet Take 20 mg by mouth daily with supper. 08/27/20   George, Robert E, NP  sertraline  (ZOLOFT ) 100 MG tablet Take 1 tablet by mouth daily.    [provider]  simvastatin  (ZOCOR ) 20 MG tablet 1 tablet in the evening    [provider]  SUMAtriptan  (IMITREX ) 25 MG tablet 1 tablet at least 2 hours between doses as needed    [provider]  topiramate (TOPAMAX) 50 MG tablet Take by mouth. 09/25/18   [provider]  VITAMIN D, CHOLECALCIFEROL, PO Take by mouth.    [provider]  zolpidem  (AMBIEN ) 10 MG tablet 1 tablet at bedtime as needed 03/29/21   [provider]    Allergies: Shellfish protein-containing drug products, Pineapple extract, Shellfish allergy, and Pineapple    Review of Systems  Gastrointestinal:  Positive for diarrhea.    Updated Vital Signs BP 137/70   Pulse 64   Temp 98.6 F (37 C)   Resp 17   Ht 5' 2 (1.575 m)   Wt 90.7 kg   SpO2 100%   BMI 36.58 kg/m   Physical Exam Vitals reviewed.  HENT:     Mouth/Throat:     Pharynx: Posterior oropharyngeal erythema present. No oropharyngeal exudate.  Cardiovascular:     Rate and Rhythm: Regular rhythm.  Abdominal:     Tenderness: There is abdominal tenderness.     Comments: Diffuse abdominal tenderness no rebound or guarding.  No hernia palpated.  Skin:    Capillary Refill: Capillary refill takes less than 2 seconds.  Neurological:     Mental Status: She is alert and oriented to person, place, and time.     (all labs ordered are listed, but only abnormal results are displayed) Labs Reviewed  CBC WITH DIFFERENTIAL/PLATELET - Abnormal; Notable for the following components:      Result Value   WBC 2.7 (*)    Hemoglobin 11.9 (*)    RDW 20.8 (*)    Neutro Abs 0.8 (*)    Basophils Absolute 0.3 (*)    All other components within normal limits  COMPREHENSIVE METABOLIC PANEL WITH GFR - Abnormal; Notable for the following components:   Potassium 2.5 (*)    Chloride 96 (*)    Glucose, Bld 117 (*)    BUN <5 (*)    Creatinine, Ser 1.16 (*)    Calcium 8.3 (*)    Albumin  3.1 (*)    GFR, Estimated 52 (*)    All other components within normal limits  MAGNESIUM  - Abnormal; Notable for the following components:   Magnesium  1.5 (*)    All other components within normal limits  CBG MONITORING, ED - Abnormal; Notable for the following components:   Glucose-Capillary 116 (*)    All other components within  normal limits  RESP PANEL BY RT-PCR (RSV, FLU A&B, COVID)  RVPGX2  C DIFFICILE QUICK SCREEN W PCR REFLEX    GASTROINTESTINAL PANEL BY PCR, STOOL (REPLACES STOOL CULTURE)  ETHANOL  RAPID URINE DRUG SCREEN, HOSP PERFORMED  URINALYSIS, ROUTINE W REFLEX MICROSCOPIC  PATHOLOGIST SMEAR REVIEW  TSH    EKG:  None  Radiology: CT Head Wo Contrast Result Date: 11/21/2023 EXAM: CT HEAD WITHOUT CONTRAST 11/21/2023 01:21:00 PM TECHNIQUE: CT of the head was performed without the administration of intravenous contrast. Automated exposure control, iterative reconstruction, and/or weight based adjustment of the mA/kV was utilized to reduce the radiation dose to as low as reasonably achievable. COMPARISON: CT head 07/11/2023 and MRI brain 03/15/2014. CLINICAL HISTORY: Neuro deficit, acute, stroke suspected. FINDINGS: BRAIN AND VENTRICLES: No acute intracranial hemorrhage or evidence of demarcated transcortical infarction. There is overall similar mild parenchymal volume loss. No hydrocephalus. No extra-axial collection. No mass effect or midline shift. ORBITS: No acute abnormality. SINUSES: No acute abnormality. Few opacified left mastoid air cells. Degenerative changes of the right temporomandibular joint. 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 SOFT TISSUES AND SKULL: No acute soft tissue abnormality. No skull fracture. IMPRESSION: 1. No acute intracranial hemorrhage or demarcated transcortical infarction. 2. No substantial change since 07/10/2020. Electronically signed by: prentice spade 11/21/2023 01:57 PM EST RP Workstation: [id number]   DG Chest 1 View Result Date: 11/21/2023 CLINICAL DATA:  Cough. EXAM: CHEST  1 VIEW COMPARISON:  01/17/2018. FINDINGS: The heart size and mediastinal contours are within normal limits. Both lungs are clear. No pleural effusion or pneumothorax. Thoracic spinal stimulator leads. Postoperative changes of the mid to lower cervical spine. No acute osseous  abnormality. IMPRESSION: No acute cardiopulmonary findings. Electronically Signed   By: Harrietta Sherry M.D.   On: 11/21/2023 13:36     Procedures   Medications Ordered in the ED  magnesium  sulfate IVPB 2 g 50 mL (2 g Intravenous New Bag/Given 11/21/23 1444)  potassium chloride  10 mEq in 100 mL IVPB (10 mEq Intravenous New Bag/Given 11/21/23 1445)  lactated ringers  bolus 1,000 mL (0 mLs Intravenous Stopped 11/21/23 1426)                                    Medical Decision Making  Patient with history myelodysplastic syndrome.  Decreased oral intake.  Diarrhea.  Has reported lost 50 pounds in 2 months.  Sore throat decreased appetite and diarrhea.  Plans to see GI.  Blood work does show hypokalemia and hypomagnesemia.  Will supplement.  However with abdominal pain and throat pain will get CT scan to evaluate.  I think likely will require admission to hospital with continued diarrhea.  Care turned over to Dr. Darra.     Final diagnoses:  Hypokalemia  Hypomagnesemia    ED Discharge Orders     None          Patsey Lot, MD 11/21/23 [phone]

## 2023-11-21 NOTE — Assessment & Plan Note (Addendum)
 WBC 2.7 Patient has been on this course of Revlimid  for 6 months at this time, 2 of which have been complicated by severe, watery diarrhea.  She has tried to take colestipol  with some relief in the last few days. - Consult oncology 11/19, appreciate recs - PT/OT eval - RD eval, appreciate recs - HIV Ab, syphillis, B12, thiamine, A1C, lipid panel pending

## 2023-11-21 NOTE — Assessment & Plan Note (Addendum)
 1.16 on admission, GFR 52. Appears to be at baseline since 2023 however there is no formal diagnosis of CKD in patient chart. New diagnosis of CKD Stage 3a.  - Expect improvement with fluids - Continue to monitor

## 2023-11-21 NOTE — Hospital Course (Addendum)
 [name] is a 68 y.o.female with a history of myelodysplasia, CVA on L, GERD, fibrmyalgia, HTN, HLD, iron  deficiency anemia, chronic L facial weakness, depression who was admitted to the [facility] Medicine Teaching Service at [facility] for electrolyte derangements in the setting of diarrhea and unintentional weight loss. Her hospital course is detailed below:  Electrolyte abnormality Patient admitted for electrolyte derangements of K/Mg 2.5/1.5 in the setting of severe chronic diarrhea and unintentional weight loss x2 months. She was given 1L LR bolus, 20mEq IV K and 2g IV Mg in the ED. Electrolytes were repleted as indicated throughout hospital course, patient was monitored closely for refeeding syndrome. Electrolytes normalized before discharge. ***   Myelodysplasia (myelodysplastic syndrome) (HCC) Weight loss, unintentional Chronic diarrhea Patient endorses similar episodes of diarrhea during prior courses of Revlimid  since 2019, at least yearly per chart review. Patient has been on current course of Revlimid  for 6 months at this time, 2 of which have been complicated by severe, watery, non-bloody diarrhea. She has previously taken colestipol  to help. Oncology was consulted and recommended ***. RD was consulted and recommended *** for unintentional weight loss. Underlying conditions for unintentional weight loss werew evaluated with a labs including HIV Ab, syphillis, B12, thiamine, A1C, lipid panel ***  Prolonged Q-T interval on ECG QT /Qtc 584/635 on admission EKG. Patient was placed on continuous telemetry monitoring. Qtc prolonging medications were avoided throughout hospital course. Qtc normalized by time of discharge.***  Elevated serum creatinine New diagnosis of CKD Stage 3a with 1.16 on admission, GFR 52 and elevated creatinine since 2023. ***  Other chronic conditions were medically managed with home medications and formulary alternatives as necessary (HTN, HLD, CAD, CVA, GERD, chronic  lumbar pain)  PCP Follow-up Recommendations: Follow up with oncology  Follow up with nephrology for new diagnosis of CKD Stage 3a Follow-up with urology for recurrent renal calculi, large stone needing lithotripsy Recommend sleep study to evaluate for OSA. Last sleep study conducted in 2018.

## 2023-11-21 NOTE — ED Notes (Signed)
 CCMD called for pt monitoring.

## 2023-11-21 NOTE — ED Notes (Signed)
 Admitting MD at [facility].

## 2023-11-21 NOTE — ED Notes (Signed)
Admitting MD/ team at [facility] 

## 2023-11-21 NOTE — ED Provider Triage Note (Signed)
 Emergency Medicine Provider Triage Evaluation Note  [name] , a 68 y.o. female  was evaluated in triage.  Pt bib ems from home, pt c.o left arm and cheek tingling that started around 1130 this morning. Has since resolved but feels very weak now. Was supposed to go to cancer center for her myelodysplasia and see GI doctor for 2 months of watery diarrhea. Has lost ~50 lbs in two months d/t diarrhea and inability to take PO. No hematochezia/melena. No productive cough but also has had cough x 1 month with fatigue and nausea. Hx of stroke with left sided deficits with residual left sided facial droop from CVA in 1990. Takes xarelto . No headache/head trauma.   Review of Systems  Positive: Watery diarrhea x 1 per day, nausea, cough, decreased PO intake, fatigue, weakness Negative: Fevers/chills, productive cough, wheezing, abd pain, hematochezia/melena, urinary sxs  Physical Exam  BP 133/87 (BP Location: Right Arm)   Pulse 91   Temp 98.6 F (37 C)   Resp 17   Ht 5' 2 (1.575 m)   Wt 90.7 kg   SpO2 99%   BMI 36.58 kg/m  Gen:   Awake, no distress  Resp:  Normal effort  Cardiac: RRR MSK:   Moves extremities without difficulty  Other:  No abd TTP. L facial droop previously known.   Medical Decision Making  Medically screening exam initiated at 12:52 PM.  Appropriate orders placed.  [name] was informed that the remainder of the evaluation will be completed by another provider, this initial triage assessment does not replace that evaluation, and the importance of remaining in the ED until their evaluation is complete.  Patient is HDS and overall non-toxic appearing. Will order labs/imaging. Will be moved to treatment space when one becomes available.    Diana Sid SAILOR, MD 11/21/23 [phone]

## 2023-11-21 NOTE — Assessment & Plan Note (Addendum)
 QT /Qtc 584/635 on admission EKG. - Avoid Qtc prolonging medications  - IM Tigan q6h PRN for nausea (low risk for qtc prolongation) - Repeat EKG in AM - Continuous telemetry monitoring

## 2023-11-21 NOTE — ED Provider Notes (Signed)
 Blood pressure 137/70, pulse 64, temperature 98.6 F (37 C), resp. rate 17, height 5' 2 (1.575 m), weight 90.7 kg, SpO2 100%.  Assuming care from Dr. Patsey.  In short, [name] is a 68 y.o. female with a chief complaint of Tremors, Nausea, Nasal Congestion, and Diarrhea .  Refer to the original H&P for additional details.  The current plan of care is to f/u on CT imaging and likely admit.  04:01 PM  CT abdomen/pelvis unremarkable. Plan for admit for Mg and K replacement.   Discussed patient's case with Family Med Teaching Service to request admission. Patient and family (if present) updated with plan.   I reviewed all nursing notes, vitals, pertinent old records, EKGs, labs, imaging (as available).    Diana Fonda MATSU, MD 11/21/23 [phone]

## 2023-11-21 NOTE — H&P (Addendum)
 Hospital Admission History and Physical Service Pager: [phone]  Patient name: [name] Medical record number: [id number] Date of Birth: 01-Jan-1956 Age: 68 y.o. Gender: female  Primary Care Provider: Zachary Lamar BRAVO, NP Consultants: None Code Status: Full code which was confirmed with patient   Preferred Emergency Contact:  Idelia Caudell - Spouse - [phone]  Chief Complaint: Diarrhea   Assessment and Plan: [name] is a 68 y.o. female presenting with unintentional weight loss, persistent diarrhea.   Differential for presentation of this includes:  Chronic iatrogenic diarrhea secondary to Revlimid : Most likely etiology with patient history of severe diarrhea episodes at least yearly since 2019 related to Revlimid  and resolving with discontinuing medication.  C-diff infection: possibility with 3 different courses of abx in the last 3 months for UTI Malignancy: Less likely with unremarkable imaging, lack of blood in stool, or personal or family history Assessment & Plan Electrolyte abnormality K/Mg 2.5/1.5 on admission.  - Admit to FMTS telemetry under attending Dr. Anders - S/p 20mEq IV K and 2g IV Mg in ED  - re-dose Mag 2 g - give additional Klor tablet 40 mEq q6h for 2 doses + 40 mEq IV - S/p 1L LR bolus in ED - Begin mIVF at 150mL/hr LR - AM BMP, Mg, Phos; monitor for refeeding syndrome Myelodysplasia (myelodysplastic syndrome) (HCC) Diarrhea Weight loss, unintentional WBC 2.7 Patient has been on this course of Revlimid  for 6 months at this time, 2 of which have been complicated by severe, watery diarrhea.  She has tried to take colestipol  with some relief in the last few days. - Consult oncology 11/19, appreciate recs - PT/OT eval - RD eval, appreciate recs - HIV Ab, syphillis, B12, thiamine, A1C, lipid panel pending  Prolonged Q-T interval on ECG QT /Qtc 584/635 on admission EKG. - Avoid Qtc prolonging medications  - IM Tigan q6h PRN for nausea  (low risk for qtc prolongation) - Repeat EKG in AM - Continuous telemetry monitoring  Elevated serum creatinine 1.16 on admission, GFR 52. Appears to be at baseline since 2023 however there is no formal diagnosis of CKD in patient chart. New diagnosis of CKD Stage 3a.  - Expect improvement with fluids - Continue to monitor Chronic health problem *remainder of home meds to be started s/p med rec CAD: Continue home simvastatin  20mg  daily  CVA: Continue home Xarelto  20mg  daily  Gout: Continue home allopurinol  100mg  daily  Migraines: Continue home sumatriptan  25mg  daily  Lumbar pain: spinal cord stimulator implant that has run out of battery. Continue home Norco 10-325mg  q6h PRN for chronic back pain  FEN/GI: clear liquids VTE Prophylaxis: Continue home Xarelto  20mg   Disposition: Admit to tele  History of Present Illness:  [name] is a 68 y.o. female presenting with unintentional weight loss, diarrhea and concern for stroke. Patient states she was going to see her doctor when she felt L sided facial and arm tingling. She was concerned this was another stroke and called EMS. She does not have this tingling anymore and states she is feeling better now that she has received fluids. She endorses watery, non-bloody diarrhea x2 months with lack of appetite and unintentional weight loss of approximately 50lbs. She is unable to tolerate PO. She has been on the most recent course of Revlimid  for her myelodysplasia for the last 6 months. She has had yearly episodes of the same diarrhea on Revlimid  since 2019 and had trials off to see if her diarrhea resolves.  In the ED, imaging including CT chest, CT neck, CT head, and CXR were unremarkable. Labs were signficant for a K of 2.5, Mg of 1.5, WBC 2.7, negative viral testing, TSH WNL, trace leuks on UA, positive for opiates on UDS. She was given 1L bolus of LR, 10mEq IV K, 2g IV Mg. GI stool panel and c-diff wuick screen pending.   Review Of Systems:  Per HPI   Pertinent Past Medical History: Anemia Anxiety/depression  Bells Palsy  GERD HTN  Migraine headache  HLD H/o PE  Morbid obesity  Fibromyalgia  H/o stroke 2016 Remainder reviewed in history tab.   Pertinent Past Surgical History: Anterior cervical decomp/discectomy  Anterior Lat lumbar fusion  Back surgery  Breast biopsy  Endometrial ablation  Diagnostic laparoscopy  Laminectomy and microdiscectomy   Total R knee arthroplasty  Remainder reviewed in history tab.   Pertinent Social History: Tobacco use: denies Alcohol  use: denies Other Substance use: denies Lives with husband   Pertinent Family History: Mother: cancer  Father: HTN  Remainder reviewed in history tab.   Important Outpatient Medications: Allopurinol  100mg  daily  Norco 10-325mg  daily  Sumatriptan  25mg  PRN  Atenolol  50mg  at bedtime Colestipol   Simvastatin  Buproprion Escitalopram Sertraline  Zolpidem  Ibandronate Progesterone Pantoprazole   Folic acid  Rivaroxaban  Lenalidomide  Gabapentin  Topiramate Calcium carbonate Potassium chloride  Vitamin D Fluticasone  nasal spray  Refresh eye drops Remainder reviewed in medication history.   Objective: BP (!) 155/76   Pulse 68   Temp 98.1 F (36.7 C) (Oral)   Resp 18   Ht 5' 2 (1.575 m)   Wt 90.7 kg   SpO2 100%   BMI 36.58 kg/m  Exam: General: well appearing obese female lying comfortably supine in hospital bed Eyes: non-icteric ENTM: clear nares, MMM, clear oropharynx  Neck: supple, full ROM, no cervical lymphadenopathy Cardiovascular: RRR, no m/r/g, 2+ radial pulses Respiratory: CTAB, normal WOB on RA, no w/r/r, good respiratory effort Gastrointestinal: soft, non-distended, diffusely tender to palpation, BS present MSK: no peripheral edema, moves all extremities equally Derm: no rashes or lesions appreciated Neuro: alert and oriented x4, CN II-XII intact, sensation intact, L facial droop at baseline Psych: Appropriate mood and  affect  Labs:  CBC BMET  Recent Labs  Lab 11/21/23 1259  WBC 2.7*  HGB 11.9*  HCT 36.3  PLT 176   Recent Labs  Lab 11/21/23 1259  NA 137  K 2.5*  CL 96*  CO2 26  BUN <5*  CREATININE 1.16*  GLUCOSE 117*  CALCIUM 8.3*    EKG: NSR, prolonged qtc 635   Imaging Studies Performed:  Imaging Study: CXR Impression from Radiologist: no acute cardiopulmonary findings.   Imaging Study: CT Head w/o Contrast Impression: 1. No acute intracranial hemorrhage or demarcated transcortical infarction. 2. No substantial change since 07/10/2020.  Imaging Study: CT Soft Tissue Neck w/Contrast Impression from Radiologist: 1. Asymmetric appearance of the left piriform sinus with possible abnormal soft tissue. Recommend correlation with direct visualization. 2. No CT evidence of epiglottitis or tonsillitis. 3. Anterior cervical fusion hardware from C4 to C7 with no solid osseous fusion at C4C5 and lucency surrounding the interbody spacer, possibly reflecting pseudarthrosis formation.   Imaging Study: CT Chest Abd Pelvis w/Contrast Impression from Radiologist: 1. No acute findings in the chest, abdomen or pelvis. 2. Few tiny subcentimeter bilateral peripheral pulmonary nodules with the largest measuring 5 mm over the medial left lower lobe without significant change compared to August 2024. This could be considered benign. No follow-up imaging recommended. 3. 3.9 cm  lower pole left renal cyst. 1.3 cm nonobstructing left renal stone. 4. Mild anterior wedging of T6 new since 2016, although age indeterminate. Stable L4 compression fracture. L2-3 fusion hardware intact. 5. Aortic atherosclerosis. Atherosclerotic coronary artery disease.  Lupie Credit, DO 11/21/2023, 4:37 PM PGY-1, [facility]  FPTS Intern pager: [phone], text pages welcome Secure chat group [facility] Teaching Service   I have reviewed the above note, agree with its content, and have made the  appropriate changes.   Damien Pinal, DO [facility], PGY-3 11/21/23 6:42 PM

## 2023-11-21 NOTE — ED Notes (Signed)
 To xray from triage previously. Now arrives by w/c from xray to room. Alert, NAD, calm, interactive.

## 2023-11-21 NOTE — Assessment & Plan Note (Deleted)
 Lumbar spinal cord stimulator implant that has run out of battery.  - Continue home Norco 10-325mg  q6h PRN for chronic back pain

## 2023-11-21 NOTE — ED Notes (Signed)
 Pt informed me need both a urine and stool sample. Pt unable to do either at this time

## 2023-11-22 ENCOUNTER — Other Ambulatory Visit (HOSPITAL_COMMUNITY): Payer: Self-pay

## 2023-11-22 ENCOUNTER — Inpatient Hospital Stay (HOSPITAL_COMMUNITY)

## 2023-11-22 ENCOUNTER — Telehealth (HOSPITAL_COMMUNITY): Payer: Self-pay

## 2023-11-22 DIAGNOSIS — E878 Other disorders of electrolyte and fluid balance, not elsewhere classified: Secondary | ICD-10-CM | POA: Diagnosis not present

## 2023-11-22 DIAGNOSIS — A0472 Enterocolitis due to Clostridium difficile, not specified as recurrent: Secondary | ICD-10-CM | POA: Diagnosis not present

## 2023-11-22 DIAGNOSIS — D469 Myelodysplastic syndrome, unspecified: Secondary | ICD-10-CM

## 2023-11-22 DIAGNOSIS — E8779 Other fluid overload: Secondary | ICD-10-CM

## 2023-11-22 DIAGNOSIS — N1831 Chronic kidney disease, stage 3a: Secondary | ICD-10-CM | POA: Insufficient documentation

## 2023-11-22 DIAGNOSIS — R634 Abnormal weight loss: Secondary | ICD-10-CM | POA: Diagnosis not present

## 2023-11-22 LAB — GASTROINTESTINAL PANEL BY PCR, STOOL (REPLACES STOOL CULTURE)

## 2023-11-22 LAB — ECHOCARDIOGRAM COMPLETE
AR max vel: 1.84 cm2
AV Area VTI: 1.94 cm2
AV Area mean vel: 1.83 cm2
AV Mean grad: 7 mmHg
AV Peak grad: 13.7 mmHg
Ao pk vel: 1.85 m/s
Area-P 1/2: 5.27 cm2
Calc EF: 69 %
Height: 62 in
MV VTI: 2.56 cm2
S' Lateral: 3.6 cm
Single Plane A2C EF: 80.2 %
Single Plane A4C EF: 57.6 %
Weight: 3200 [oz_av]

## 2023-11-22 LAB — URINALYSIS, COMPLETE (UACMP) WITH MICROSCOPIC
Bilirubin Urine: NEGATIVE
Glucose, UA: NEGATIVE mg/dL
Ketones, ur: NEGATIVE mg/dL
Nitrite: NEGATIVE
Protein, ur: 100 mg/dL — AB
Specific Gravity, Urine: 1.024 (ref 1.005–1.030)
WBC, UA: >50 WBC/hpf (ref 0–5)
pH: 5 (ref 5.0–8.0)

## 2023-11-22 LAB — CBC
HCT: 30.8 % — ABNORMAL LOW (ref 36.0–46.0)
Hemoglobin: 9.8 g/dL — ABNORMAL LOW (ref 12.0–15.0)
MCH: 27.7 pg (ref 26.0–34.0)
MCHC: 31.8 g/dL (ref 30.0–36.0)
MCV: 87 fL (ref 80.0–100.0)
Platelets: 124 K/uL — ABNORMAL LOW (ref 150–400)
RBC: 3.54 MIL/uL — ABNORMAL LOW (ref 3.87–5.11)
RDW: 20.8 % — ABNORMAL HIGH (ref 11.5–15.5)
WBC: 2.3 K/uL — ABNORMAL LOW (ref 4.0–10.5)
nRBC: 0 % (ref 0.0–0.2)

## 2023-11-22 LAB — VITAMIN B12: Vitamin B-12: 2700 pg/mL — ABNORMAL HIGH (ref 180–914)

## 2023-11-22 LAB — HIV ANTIBODY (ROUTINE TESTING W REFLEX): HIV Screen 4th Generation wRfx: NONREACTIVE

## 2023-11-22 LAB — C DIFFICILE QUICK SCREEN W PCR REFLEX
C Diff antigen: POSITIVE — AB
C Diff toxin: NEGATIVE

## 2023-11-22 LAB — RPR: RPR Ser Ql: NONREACTIVE

## 2023-11-22 LAB — PHOSPHORUS: Phosphorus: 2.8 mg/dL (ref 2.5–4.6)

## 2023-11-22 LAB — LIPID PANEL
Cholesterol: 79 mg/dL (ref 0–200)
HDL: 42 mg/dL (ref >40)
LDL Cholesterol: 23 mg/dL (ref 0–99)
Total CHOL/HDL Ratio: 1.9 ratio
Triglycerides: 68 mg/dL (ref <150)
VLDL: 14 mg/dL (ref 0–40)

## 2023-11-22 LAB — MAGNESIUM: Magnesium: 2.2 mg/dL (ref 1.7–2.4)

## 2023-11-22 LAB — BASIC METABOLIC PANEL WITH GFR
Anion gap: 13 (ref 5–15)
BUN: <5 mg/dL — ABNORMAL LOW (ref 8–23)
CO2: 26 mmol/L (ref 22–32)
Calcium: 8 mg/dL — ABNORMAL LOW (ref 8.9–10.3)
Chloride: 97 mmol/L — ABNORMAL LOW (ref 98–111)
Creatinine, Ser: 1.01 mg/dL — ABNORMAL HIGH (ref 0.44–1.00)
GFR, Estimated: >60 mL/min (ref >60)
Glucose, Bld: 86 mg/dL (ref 70–99)
Potassium: 3.3 mmol/L — ABNORMAL LOW (ref 3.5–5.1)
Sodium: 136 mmol/L (ref 135–145)

## 2023-11-22 LAB — HEMOGLOBIN A1C
Hgb A1c MFr Bld: 4.6 % — ABNORMAL LOW (ref 4.8–5.6)
Mean Plasma Glucose: 85.32 mg/dL

## 2023-11-22 LAB — TROPONIN I (HIGH SENSITIVITY): Troponin I (High Sensitivity): 27 ng/L — ABNORMAL HIGH (ref <18)

## 2023-11-22 LAB — PATHOLOGIST SMEAR REVIEW

## 2023-11-22 LAB — CLOSTRIDIUM DIFFICILE BY PCR, REFLEXED
Hypervirulent Strain: NEGATIVE
Toxigenic C. Difficile by PCR: POSITIVE — AB

## 2023-11-22 MED ORDER — HYDROCODONE-ACETAMINOPHEN 10-325 MG PO TABS: 1.0000 | ORAL_TABLET | Freq: Four times a day (QID) | ORAL | Status: DC | PRN

## 2023-11-22 MED ORDER — POTASSIUM CHLORIDE 10 MEQ/100ML IV SOLN
10.0000 meq | Freq: Once | INTRAVENOUS | Status: AC
  Administered 2023-11-22: 10 meq via INTRAVENOUS
  Filled 2023-11-22: qty 100

## 2023-11-22 MED ORDER — PERFLUTREN LIPID MICROSPHERE
1.0000 mL | INTRAVENOUS | Status: AC | PRN
  Administered 2023-11-22: 2 mL via INTRAVENOUS

## 2023-11-22 MED ORDER — POTASSIUM CHLORIDE CRYS ER 20 MEQ PO TBCR
40.0000 meq | EXTENDED_RELEASE_TABLET | ORAL | Status: AC
Start: 2023-11-22 — End: 2023-11-22
  Administered 2023-11-22 (×2): 40 meq via ORAL
  Filled 2023-11-22 (×2): qty 2

## 2023-11-22 MED ORDER — PNEUMOCOCCAL 20-VAL CONJ VACC 0.5 ML IM SUSY
0.5000 mL | PREFILLED_SYRINGE | INTRAMUSCULAR | Status: AC
  Administered 2023-11-23: 0.5 mL via INTRAMUSCULAR
  Filled 2023-11-22: qty 0.5

## 2023-11-22 MED ORDER — FIDAXOMICIN 200 MG PO TABS
200.0000 mg | ORAL_TABLET | Freq: Two times a day (BID) | ORAL | Status: DC
  Administered 2023-11-22 – 2023-11-25 (×7): 200 mg via ORAL
  Filled 2023-11-22 (×8): qty 1

## 2023-11-22 MED ORDER — ACETAMINOPHEN 500 MG PO TABS
1000.0000 mg | ORAL_TABLET | Freq: Four times a day (QID) | ORAL | Status: DC | PRN
  Administered 2023-11-22 – 2023-11-25 (×6): 1000 mg via ORAL
  Filled 2023-11-22 (×6): qty 2

## 2023-11-22 MED ORDER — INFLUENZA VAC SPLIT HIGH-DOSE 0.5 ML IM SUSY
0.5000 mL | PREFILLED_SYRINGE | INTRAMUSCULAR | Status: AC
  Administered 2023-11-23: 0.5 mL via INTRAMUSCULAR
  Filled 2023-11-22: qty 0.5

## 2023-11-22 MED ORDER — OXYCODONE HCL 5 MG PO TABS
10.0000 mg | ORAL_TABLET | Freq: Four times a day (QID) | ORAL | Status: DC | PRN
  Administered 2023-11-22 – 2023-11-25 (×9): 10 mg via ORAL
  Filled 2023-11-22 (×11): qty 2

## 2023-11-22 MED ORDER — BOOST / RESOURCE BREEZE PO LIQD CUSTOM
1.0000 | Freq: Three times a day (TID) | ORAL | Status: DC
  Administered 2023-11-22 – 2023-11-24 (×8): 1 via ORAL
  Administered 2023-11-25: 237 mL via ORAL

## 2023-11-22 NOTE — Progress Notes (Signed)
 Pt c/o of burning and pain with urination. Reports It feels like I am passing a stone.    On call provider Romelle contacted to make aware. Pt also c/o of back pain.

## 2023-11-22 NOTE — Assessment & Plan Note (Addendum)
 K 3.3 today, Mg normalized.  - Replete as indicated.  - Discontinue mIVF and encourage PO intake - Daily BMP, Mg, Phos; monitor for refeeding syndrome

## 2023-11-22 NOTE — Assessment & Plan Note (Addendum)
 QT /Qtc 584/635 on admission EKG. 609 today, improving.  - Avoid Qtc prolonging medications  - IM Tigan q6h PRN for nausea (low risk for qtc prolongation) - Repeat EKG in AM - Continuous telemetry monitoring  - Do not resume SSRIs until improved

## 2023-11-22 NOTE — Assessment & Plan Note (Deleted)
 1.16 on admission, GFR 52. Appears to be at baseline since 2023 however there is no formal diagnosis of CKD in patient chart. New diagnosis of CKD Stage 3a.  - Expect improvement with fluids - Continue to monitor

## 2023-11-22 NOTE — Progress Notes (Signed)
  Echocardiogram 2D Echocardiogram has been performed.  [name] [name] 11/22/2023, 10:05 AM

## 2023-11-22 NOTE — Assessment & Plan Note (Signed)
 New diagnosis of CKD Stage 3a. Improving. - Continue to monitor

## 2023-11-22 NOTE — Plan of Care (Signed)
 FMTS Interim Progress Note  Saw pt for dysuria and sensation of stone She did have a nonobstructing stone noted on CT yesterday  Her pain is primarily in left flank radiating into groin consistent with the stone noted on imaging Reassuringly no fevers, HDS She reportedly has had multiple stones before and is used to it, follows w/ urology. She feels like it is tolerable   Ordered a UA  No further orders at this time, pain control as ordered. She is due for her next prn oxy if needed   Romelle Booty, MD 11/22/2023, 8:45 PM PGY-3, [facility] Family Medicine Service pager [phone]

## 2023-11-22 NOTE — Plan of Care (Signed)
 68 y.o. lady with low-grade myelodysplasia, deletion 5 q., currently on Revlimid  5 mg in conjunction with luspatercept , being followed by Dr. Ezzard at [facility], was admitted with diarrhea and was diagnosed with C. difficile.  Has mild pancytopenia.  Recommend holding Revlimid  and continuing treatment for C. difficile.  We expect blood counts to improve with this intervention.  Continue to monitor CBCD daily and transfuse as needed to maintain hemoglobin above 8 and platelet count above 20,000.  No indication for G-CSF support at this time.  Will follow her labs peripherally.  Please call us  with any questions or concerns.  Thank you, Chinita Patten, MD

## 2023-11-22 NOTE — Assessment & Plan Note (Addendum)
 WBC 2.7 on admission, 2.3 today. Patient has been on this course of Revlimid  for 6 months at this time, 2 of which have been complicated by severe, watery diarrhea.  - PT/OT eval - RD eval, appreciate recs - Consulted oncology Dr. Autumn, appreciate recommendations  - Revlimid  is not the cause for the neutropenia   - Most likely related to inflammatory process from c-diff infection   - Treat c-diff with expectations of improvement in neutropenia   - Close lab monitoring

## 2023-11-22 NOTE — Progress Notes (Signed)
 Daily Progress Note Intern Pager: [phone]  Patient name: [name] Medical record number: [id number] Date of birth: 02-04-1955 Age: 68 y.o. Gender: female  Primary Care Provider: Zachary Lamar BRAVO, NP Consultants: None Code Status: Full  Pt Overview and Major Events to Date:  11/18: Admitted for electrolyte derangements in the setting of severe diarrhea and unintentional weight loss  Assessment and Plan:  [name] is a 68 y.o. female electrolyte derangements in the setting of severe diarrhea and unintentional weight loss.  Hospital course has been complicated by positive C. difficile infection.   Assessment & Plan C. difficile diarrhea Positive C. difficile antigen and PCR.  Most likely from recent recurrent antibiotic use for UTI. 1 episode of watery, non-bloody diarrhea last night.  - Oral fidaxomicin 200 mg twice daily for 10 days - Consider GI consult if unable to tolerate PO Myelodysplasia (myelodysplastic syndrome) (HCC) Weight loss, unintentional WBC 2.7 on admission, 2.3 today. Patient has been on this course of Revlimid  for 6 months at this time, 2 of which have been complicated by severe, watery diarrhea.  - PT/OT eval - RD eval, appreciate recs - Consulted oncology Dr. Autumn, appreciate recommendations  - Revlimid  is not the cause for the neutropenia   - Most likely related to inflammatory process from c-diff infection   - Treat c-diff with expectations of improvement in neutropenia   - Close lab monitoring  Electrolyte abnormality K 3.3 today, Mg normalized.  - Replete as indicated.  - Discontinue mIVF and encourage PO intake - Daily BMP, Mg, Phos; monitor for refeeding syndrome QT prolongation QT /Qtc 584/635 on admission EKG. 609 today, improving.  - Avoid Qtc prolonging medications  - IM Tigan q6h PRN for nausea (low risk for qtc prolongation) - Repeat EKG in AM - Continuous telemetry monitoring  - Do not resume SSRIs until  improved Chronic kidney disease, stage 3a (HCC) New diagnosis of CKD Stage 3a. Improving. - Continue to monitor Chronic health problem *remainder of home meds to be started s/p med rec CAD: Continue home simvastatin  20mg  daily  CVA: Continue home Xarelto  20mg  daily  Gout: Continue home allopurinol  100mg  daily  Migraines: Continue home sumatriptan  25mg  daily  Lumbar pain: spinal cord stimulator implant that has run out of battery. Continue home Norco 10-325mg  q6h PRN for chronic back pain Dry eyes: Continue home refresh ophthalmic drops  FEN/GI: Clear liquids, upgrade as tolerated PPx: Xarelto  Dispo:Home pending clinical improvement .   Subjective:  Patient was seen and evaluated at bedside.  Patient states she is doing much better today than she was on admission and rates her abdominal pain a 1 out of 10. 1 of episode of watery diarrhea overnight. VSS overnight. Abdominal pain a 1 out of 10 more like a periumbilical soreness. Able to keep a few spoonfuls of chicken broth and some Jell-O down yesterday, excited to eat clear liquids this morning, no real complaints of nausea.  Objective: Temp:  [98.1 F (36.7 C)-99.1 F (37.3 C)] 99.1 F (37.3 C) (11/19 0435) Pulse Rate:  [61-91] 78 (11/19 0435) Resp:  [14-24] 18 (11/19 0435) BP: (112-155)/(56-95) 146/76 (11/19 0435) SpO2:  [97 %-100 %] 99 % (11/19 0435) Weight:  [90.7 kg] 90.7 kg (11/18 1230) Physical Exam: General: well appearing obese female lying comfortably in hospital bed in no acute distress Cardiovascular: RRR, no m/r/g, 2+ radial pulses Respiratory: CTAB, normal WOB on RA, no w/r/r Abdomen: soft, mild periumbilical tenderness, non-distended Extremities: no peripheral edema  Laboratory: Most recent CBC Lab Results  Component Value Date   WBC 2.3 (L) 11/22/2023   HGB 9.8 (L) 11/22/2023   HCT 30.8 (L) 11/22/2023   MCV 87.0 11/22/2023   PLT 124 (L) 11/22/2023   Most recent BMP    Latest Ref Rng & Units 11/22/2023     3:29 AM  BMP  Glucose 70 - 99 mg/dL 86   BUN 8 - 23 mg/dL <5   Creatinine 9.55 - 1.00 mg/dL 8.98   Sodium 864 - 854 mmol/L 136   Potassium 3.5 - 5.1 mmol/L 3.3   Chloride 98 - 111 mmol/L 97   CO2 22 - 32 mmol/L 26   Calcium 8.9 - 10.3 mg/dL 8.0    Imaging/Diagnostic Tests: EKG 11/19: sinus rhythm at 86bpm, qt/qtc 509/609  Lupie Credit, DO 11/22/2023, 7:06 AM  PGY-1, [facility] Family Medicine FPTS Intern pager: [phone], text pages welcome Secure chat group [facility] Teaching Service

## 2023-11-22 NOTE — ED Notes (Signed)
 Pt unable to provide a stool sample, has not had a bowel movement

## 2023-11-22 NOTE — TOC Initial Note (Signed)
 Transition of Care [facility]) - Initial/Assessment Note    Patient Details  Name: [name] MRN: [id number] Date of Birth: 1955/09/06  Transition of Care [facility] CM/SW Contact:    Marval Gell, RN Phone Number: 11/22/2023, 2:23 PM  Clinical Narrative:                  Per chart review.  Patient admitted from home lives with spouse.  + C. Diff PT OT evals pending.  Patient has insurance coverage and primary care listed.  ICM will continue to follow   Expected Discharge Plan: Home w Home Health Services Barriers to Discharge: Continued Medical Work up   Patient Goals and CMS Choice            Expected Discharge Plan and Services   Discharge Planning Services: CM Consult   Living arrangements for the past 2 months: Single Family Home                                      Prior Living Arrangements/Services Living arrangements for the past 2 months: Single Family Home Lives with:: Spouse                   Activities of Daily Living   ADL Screening (condition at time of admission) Independently performs ADLs?: Yes (appropriate for developmental age) Is the patient deaf or have difficulty hearing?: Yes (Sinus infection) Does the patient have difficulty seeing, even when wearing glasses/contacts?: No Does the patient have difficulty concentrating, remembering, or making decisions?: No  Permission Sought/Granted                  Emotional Assessment              Admission diagnosis:  Hypokalemia [E87.6] Hypomagnesemia [E83.42] Electrolyte abnormality [E87.8] Patient Active Problem List   Diagnosis Date Noted   C. difficile diarrhea 11/22/2023   Chronic kidney disease, stage 3a (HCC) 11/22/2023   Electrolyte abnormality 11/21/2023   Diarrhea 11/21/2023   Weight loss, unintentional 11/21/2023   Elevated serum creatinine 11/21/2023   Chronic lumbar pain 11/21/2023   QT prolongation 11/21/2023   Hypokalemia 11/21/2023    Hypomagnesemia 11/21/2023   Paresthesia 11/21/2023   Iron  deficiency anemia 10/03/2022   Myelodysplasia (myelodysplastic syndrome) (HCC) 04/07/2020   Herniated cervical disc without myelopathy 07/11/2019   Essential hypertension 11/22/2018   Mixed hyperlipidemia 11/22/2018   Obesity (BMI 30-39.9) 11/22/2018   Acute respiratory failure (HCC)    AKI (acute kidney injury)    Lumbar scoliosis 08/02/2016   Primary localized osteoarthritis of right knee 06/22/2015   Bell palsy    Chronic health problem    Pulmonary embolism (HCC)    Colon polyps    GERD 10/29/2008   CHEST PAIN 10/29/2008   History of colonic polyps 10/29/2008   PCP:  Zachary Lamar BRAVO, NP Pharmacy:   [facility] [place], [place] - [address] [place] [address] Phone: [phone] Fax: [phone]  [facility] [place], [place] - [address] [place] [address] Phone: [phone] Fax: [phone]  [facility] [address] [place] [address] Phone: [phone] Fax: [phone]     Social Drivers of Health (SDOH) Social History: SDOH Screenings   Food Insecurity: No Food Insecurity (11/22/2023)  Housing: Low Risk  (11/22/2023)  Transportation Needs: No  Transportation Needs (11/22/2023)  Utilities: Not At Risk (11/22/2023)  Depression (PHQ2-9): Low Risk  (08/07/2023)  Social Connections: Moderately Isolated (11/22/2023)  Tobacco Use: Low Risk  (11/21/2023)   SDOH Interventions:     Readmission Risk Interventions     No data to display

## 2023-11-22 NOTE — Assessment & Plan Note (Deleted)
 WBC 2.7 Patient has been on this course of Revlimid  for 6 months at this time, 2 of which have been complicated by severe, watery diarrhea.  She has tried to take colestipol  with some relief in the last few days. - Consult oncology 11/19, appreciate recs - PT/OT eval - RD eval, appreciate recs - HIV Ab, syphillis, B12, thiamine, A1C, lipid panel pending

## 2023-11-22 NOTE — Assessment & Plan Note (Addendum)
 Positive C. difficile antigen and PCR.  Most likely from recent recurrent antibiotic use for UTI. 1 episode of watery, non-bloody diarrhea last night.  - Oral fidaxomicin  200 mg twice daily for 10 days - Consider GI consult if unable to tolerate PO

## 2023-11-22 NOTE — Telephone Encounter (Signed)
 Pharmacy Patient Advocate Encounter  Insurance verification completed.    The patient is insured through [facility]. Patient has Medicare and is not eligible for a copay card, but may be able to apply for patient assistance or Medicare RX Payment Plan (Patient Must reach out to their plan, if eligible for payment plan), if available.    Ran test claim for Generic Dificid  200mg  and the current 30 day co-pay is $0.   This test claim was processed through [facility]- copay amounts may vary at other pharmacies due to [employer], or as the patient moves through the different stages of their insurance plan.

## 2023-11-22 NOTE — ED Notes (Signed)
 Echo at bedside

## 2023-11-22 NOTE — Assessment & Plan Note (Addendum)
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 Continue home Norco 10-325mg  q6h PRN for chronic back pain Dry eyes: Continue home refresh ophthalmic drops

## 2023-11-23 ENCOUNTER — Inpatient Hospital Stay (HOSPITAL_COMMUNITY)

## 2023-11-23 DIAGNOSIS — E43 Unspecified severe protein-calorie malnutrition: Secondary | ICD-10-CM | POA: Insufficient documentation

## 2023-11-23 DIAGNOSIS — N390 Urinary tract infection, site not specified: Secondary | ICD-10-CM | POA: Insufficient documentation

## 2023-11-23 DIAGNOSIS — A0472 Enterocolitis due to Clostridium difficile, not specified as recurrent: Secondary | ICD-10-CM | POA: Diagnosis not present

## 2023-11-23 LAB — BASIC METABOLIC PANEL WITH GFR
Anion gap: 8 (ref 5–15)
BUN: <5 mg/dL — ABNORMAL LOW (ref 8–23)
CO2: 28 mmol/L (ref 22–32)
Calcium: 7.9 mg/dL — ABNORMAL LOW (ref 8.9–10.3)
Chloride: 98 mmol/L (ref 98–111)
Creatinine, Ser: 0.96 mg/dL (ref 0.44–1.00)
GFR, Estimated: >60 mL/min (ref >60)
Glucose, Bld: 90 mg/dL (ref 70–99)
Potassium: 4.6 mmol/L (ref 3.5–5.1)
Sodium: 134 mmol/L — ABNORMAL LOW (ref 135–145)

## 2023-11-23 LAB — CBC WITH DIFFERENTIAL/PLATELET
Abs Immature Granulocytes: 0.01 K/uL (ref 0.00–0.07)
Basophils Absolute: 0.2 K/uL — ABNORMAL HIGH (ref 0.0–0.1)
Basophils Relative: 6 %
Eosinophils Absolute: 0.2 K/uL (ref 0.0–0.5)
Eosinophils Relative: 5 %
HCT: 28.9 % — ABNORMAL LOW (ref 36.0–46.0)
Hemoglobin: 9.2 g/dL — ABNORMAL LOW (ref 12.0–15.0)
Immature Granulocytes: 0 %
Lymphocytes Relative: 57 %
Lymphs Abs: 1.7 K/uL (ref 0.7–4.0)
MCH: 28 pg (ref 26.0–34.0)
MCHC: 31.8 g/dL (ref 30.0–36.0)
MCV: 87.8 fL (ref 80.0–100.0)
Monocytes Absolute: 0.5 K/uL (ref 0.1–1.0)
Monocytes Relative: 16 %
Neutro Abs: 0.5 K/uL — ABNORMAL LOW (ref 1.7–7.7)
Neutrophils Relative %: 16 %
Platelets: 120 K/uL — ABNORMAL LOW (ref 150–400)
RBC: 3.29 MIL/uL — ABNORMAL LOW (ref 3.87–5.11)
RDW: 21.2 % — ABNORMAL HIGH (ref 11.5–15.5)
Smear Review: NORMAL
WBC: 2.9 K/uL — ABNORMAL LOW (ref 4.0–10.5)
nRBC: 0 % (ref 0.0–0.2)

## 2023-11-23 LAB — MAGNESIUM: Magnesium: 1.9 mg/dL (ref 1.7–2.4)

## 2023-11-23 LAB — PHOSPHORUS: Phosphorus: 3.9 mg/dL (ref 2.5–4.6)

## 2023-11-23 MED ORDER — MAGNESIUM SULFATE IN D5W 1-5 GM/100ML-% IV SOLN
1.0000 g | Freq: Once | INTRAVENOUS | Status: AC
  Administered 2023-11-23: 1 g via INTRAVENOUS
  Filled 2023-11-23: qty 100

## 2023-11-23 MED ORDER — TAMSULOSIN HCL 0.4 MG PO CAPS
0.4000 mg | ORAL_CAPSULE | Freq: Every day | ORAL | Status: DC
  Administered 2023-11-23 – 2023-11-25 (×3): 0.4 mg via ORAL
  Filled 2023-11-23 (×3): qty 1

## 2023-11-23 MED ORDER — ADULT MULTIVITAMIN W/MINERALS CH
1.0000 | ORAL_TABLET | Freq: Every day | ORAL | Status: DC
  Administered 2023-11-23 – 2023-11-25 (×3): 1 via ORAL
  Filled 2023-11-23 (×3): qty 1

## 2023-11-23 MED ORDER — PHENAZOPYRIDINE HCL 200 MG PO TABS: 200.0000 mg | ORAL_TABLET | Freq: Three times a day (TID) | ORAL | Status: DC

## 2023-11-23 MED ORDER — SODIUM CHLORIDE 0.9 % IV SOLN
1.0000 g | INTRAVENOUS | Status: DC
  Administered 2023-11-23: 1 g via INTRAVENOUS
  Filled 2023-11-23: qty 10

## 2023-11-23 MED ORDER — SACCHAROMYCES BOULARDII 250 MG PO CAPS
250.0000 mg | ORAL_CAPSULE | Freq: Two times a day (BID) | ORAL | Status: DC
  Administered 2023-11-23 – 2023-11-25 (×5): 250 mg via ORAL
  Filled 2023-11-23 (×6): qty 1

## 2023-11-23 MED ORDER — PHENAZOPYRIDINE HCL 200 MG PO TABS
200.0000 mg | ORAL_TABLET | Freq: Three times a day (TID) | ORAL | Status: AC
  Administered 2023-11-23 – 2023-11-24 (×5): 200 mg via ORAL
  Filled 2023-11-23 (×6): qty 1

## 2023-11-23 MED ORDER — SODIUM CHLORIDE 0.9 % IV BOLUS
1000.0000 mL | Freq: Once | INTRAVENOUS | Status: AC
  Administered 2023-11-23: 1000 mL via INTRAVENOUS

## 2023-11-23 NOTE — Assessment & Plan Note (Signed)
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 Continue home Norco 10-325mg  q6h PRN for chronic back pain Dry eyes: Continue home refresh ophthalmic drops

## 2023-11-23 NOTE — Assessment & Plan Note (Addendum)
 New diagnosis of CKD Stage 3a. Improving. - Flank and suprapubic pain, get renal CT stone study today -Catch stone with hat, will test if passed - Flomax , Pyridium  for pain control - 500cc bolus to flush possible kidney stone and poor oral hydration 2/2 pain

## 2023-11-23 NOTE — Evaluation (Signed)
 Occupational Therapy Evaluation Patient Details Name: [name] MRN: [id number] DOB: 12/31/1955 Today's Date: 11/23/2023   History of Present Illness   68 y.o. female presents to [facility] 11/21/23 with unintentional weight loss, weakness, and persistent diarrhea. + C.diff. CT showed nonobstructing stone. PMHx: GAD, MDD, Fibromyalgia, HLD, HTN, Morbid obesity, Myelodysplastic disorder, PE, CVA, CKD 3a and thyroid  tumor     Clinical Impressions Patient appears to be at her baseline for in room mobility/toileting and ADL completion from a sit to stand level.  No significant OT needs identified, will defer to PT for mobility.  No post acute OT indicated.       If plan is discharge home, recommend the following:    None     Functional Status Assessment   Patient has not had a recent decline in their functional status     Equipment Recommendations   None recommended by OT     Recommendations for Other Services         Precautions/Restrictions   Precautions Precautions: Fall Recall of Precautions/Restrictions: Intact Restrictions Weight Bearing Restrictions Per Provider Order: No     Mobility Bed Mobility Overal bed mobility: Modified Independent                  Transfers Overall transfer level: Modified independent Equipment used: None               General transfer comment: In room mobility/toileting      Balance Overall balance assessment: Mild deficits observed, not formally tested                                         ADL either performed or assessed with clinical judgement   ADL Overall ADL's : At baseline                                             Vision Patient Visual Report: No change from baseline       Perception Perception: Within Functional Limits       Praxis Praxis: [facility]       Pertinent Vitals/Pain Pain Assessment Pain Assessment: Faces Faces Pain Scale: Hurts little  more Pain Location: bladder and low back Pain Descriptors / Indicators: Aching, Discomfort Pain Intervention(s): Monitored during session     Extremity/Trunk Assessment Upper Extremity Assessment Upper Extremity Assessment: Overall WFL for tasks assessed   Lower Extremity Assessment Lower Extremity Assessment: Defer to PT evaluation   Cervical / Trunk Assessment Cervical / Trunk Assessment: Normal   Communication Communication Communication: No apparent difficulties Factors Affecting Communication: Hearing impaired   Cognition Arousal: Alert Behavior During Therapy: WFL for tasks assessed/performed Cognition: No apparent impairments                               Following commands: Intact       Cueing  General Comments   Cueing Techniques: Verbal cues  VSS on RA   Exercises     Shoulder Instructions      Home Living Family/patient expects to be discharged to:: Private residence Living Arrangements: Spouse/significant other Available Help at Discharge: Family;Available PRN/intermittently Type of Home: House Home Access: Stairs to enter [employer] of Steps: 4 Entrance Stairs-Rails:  None Home Layout: Two level;Bed/bath upstairs Alternate Level Stairs-Number of Steps: 20 Alternate Level Stairs-Rails: Left Bathroom Shower/Tub: Producer, Television/film/video: Handicapped height     Home Equipment: Shower seat - built in;Grab bars - tub/shower;Rolling Environmental Consultant (2 wheels);Cane - single point   Additional Comments: Spouse continues to work      Prior Functioning/Environment Prior Level of Function : Independent/Modified Independent;Driving             Mobility Comments: Ind with no AD, SP cane in the community ADLs Comments: Ind    OT Problem List: Impaired balance (sitting and/or standing)   OT Treatment/Interventions:        OT Goals(Current goals can be found in the care plan section)   Acute Rehab OT Goals Patient  Stated Goal: Return home OT Goal Formulation: With patient Time For Goal Achievement: 11/30/23 Potential to Achieve Goals: Good   OT Frequency:       Co-evaluation              AM-PAC OT 6 Clicks Daily Activity     Outcome Measure Help from another person eating meals?: None Help from another person taking care of personal grooming?: None Help from another person toileting, which includes using toliet, bedpan, or urinal?: None Help from another person bathing (including washing, rinsing, drying)?: None Help from another person to put on and taking off regular upper body clothing?: None Help from another person to put on and taking off regular lower body clothing?: None 6 Click Score: 24   End of Session Nurse Communication: Mobility status  Activity Tolerance: Patient tolerated treatment well Patient left: in bed  OT Visit Diagnosis: Unsteadiness on feet (R26.81)                Time: 9071-9057 OT Time Calculation (min): 14 min Charges:  OT General Charges $OT Visit: 1 Visit OT Evaluation $OT Eval Moderate Complexity: 1 Mod  11/23/2023  RP, OTR/L  Acute Rehabilitation Services  Office:  [phone]   Charlie JONETTA Halsted 11/23/2023, 9:46 AM

## 2023-11-23 NOTE — Assessment & Plan Note (Addendum)
 WBC count improving to 2.9 today with treatment of C. difficile. - PT/OT eval - RD eval, appreciate recs - Consulted oncology Dr. Autumn, appreciate recommendations  - Revlimid  is not the cause for the neutropenia   - Most likely related to inflammatory process from c-diff infection   - Treat c-diff with expectations of improvement in neutropenia   - Close lab monitoring

## 2023-11-23 NOTE — Assessment & Plan Note (Addendum)
 Improving to 480 today. - Avoid Qtc prolonging medications  - IM Tigan  q6h PRN for nausea (low risk for qtc prolongation) - Repeat EKG in AM - Continuous telemetry monitoring  - Do not resume SSRIs until improved

## 2023-11-23 NOTE — Assessment & Plan Note (Signed)
 Positive UA overnight.  - Will treat with ceftriaxone  once urine culture collected

## 2023-11-23 NOTE — Progress Notes (Signed)
 Initial Nutrition Assessment  DOCUMENTATION CODES:   Severe malnutrition in context of chronic illness  INTERVENTION:  Daily multivitamin PO. 250 mg Florastor (Saccharomyces boulardii) BID for C diff diarrhea. Continue CLD with Boost Breeze TID. Each supplement provides 250 Kcals and 9 grams of protein. Patient to order more than TID if able. Advance diet as tolerated to fiber-restricted diet.   NUTRITION DIAGNOSIS:   Severe Malnutrition related to chronic illness as evidenced by percent weight loss, energy intake < or equal to 75% for > or equal to 1 month.    GOAL:   Patient will meet greater than or equal to 90% of their needs    MONITOR:   PO intake, Supplement acceptance, Diet advancement, Labs, Weight trends  REASON FOR ASSESSMENT:   Consult Assessment of nutrition requirement/status  ASSESSMENT:   Patient presented with severe diarrhea and weight loss and was found to have C. difficile. PMH significant for Myelodysplasia syndrome being followed by [facility] RD with reports of poor PO intake since 09/2023 and 36-lb weight loss as of 11/07/23, HTN, CAD, CVA, gout, anxiety/depression.  Visited the patient who states that she has suffered from dysgeusia and lack of appetite for several months with just bites of food daily. She has also had diarrhea for a few months and states it has improved since admission with just one BM daily. Her Pharmacist and oncology RD had her on probiotics but she did not feel like it helped. She just discovered that she can tolerate Boost Breeze and is excited about that. Encouraged patient to drink at least 3 a day, to ask for more if she can tolerate it, and to resume this regimen post discharge. She has also had issues staying hydrated due to water/liquids tasting off and I recommended she try glass-bottled spring water. She wants to get back to being able to exercise again and does not want to gain weight back although she did not  intent to lose the weight this way.  Scheduled Meds:  feeding supplement  1 Container Oral TID BM   fidaxomicin  200 mg Oral BID   folic acid   1 mg Oral Daily   gabapentin   300 mg Oral TID   Influenza vac split trivalent PF  0.5 mL Intramuscular Tomorrow-1000   pantoprazole   40 mg Oral Daily   phenazopyridine  200 mg Oral TID WC   pneumococcal 20-valent conjugate vaccine  0.5 mL Intramuscular Tomorrow-1000   rivaroxaban   20 mg Oral Q supper   tamsulosin  0.4 mg Oral Daily     Diet Order             Diet clear liquid Room service appropriate? Yes; Fluid consistency: Thin  Diet effective now                  Meal Intake: Minimal, but drinking Boost Breeze  Labs:     Latest Ref Rng & Units 11/23/2023    3:02 AM 11/22/2023    3:29 AM 11/21/2023    8:58 PM  CMP  Glucose 70 - 99 mg/dL 90  86  98   BUN 8 - 23 mg/dL <5  <5  <5   Creatinine 0.44 - 1.00 mg/dL 9.03  8.98  8.92   Sodium 135 - 145 mmol/L 134  136  135   Potassium 3.5 - 5.1 mmol/L 4.6  3.3  2.9   Chloride 98 - 111 mmol/L 98  97  94   CO2 22 - 32  mmol/L 28  26  27    Calcium 8.9 - 10.3 mg/dL 7.9  8.0  8.1     I/O: +1.5 L since admit  NUTRITION - FOCUSED PHYSICAL EXAM:  Flowsheet Row Most Recent Value  Orbital Region No depletion  Upper Arm Region Mild depletion  Thoracic and Lumbar Region No depletion  Buccal Region No depletion  Temple Region Moderate depletion  Clavicle Bone Region No depletion  Clavicle and Acromion Bone Region No depletion  Scapular Bone Region No depletion  Dorsal Hand No depletion  Patellar Region No depletion  Anterior Thigh Region No depletion  Posterior Calf Region No depletion  Edema (RD Assessment) None  Hair Reviewed  Eyes Reviewed  Mouth Reviewed  Skin Reviewed  Nails Reviewed    EDUCATION NEEDS:   Education needs have been addressed  Skin:  Skin Assessment: Reviewed RN Assessment  Last BM:     Height:   Ht Readings from Last 1 Encounters:  11/21/23 5'  2 (1.575 m)    Weight:    Weight Change: 24 Kg (21%) loss in 6 months Usual Body Weight: 250 lbs (113.5 Kg)   Edema: none  Ideal Body Weight:  50 kg   BMI:  Body mass index is 36.58 kg/m.  Estimated Nutritional Needs:  Kcal:  1800-2000 Protein:  95-115 Fluid:  >1800    Leverne Ruth, MS, RDN, LDN [place]. [facility] See AMION for contact information

## 2023-11-23 NOTE — Evaluation (Signed)
 Physical Therapy Evaluation Patient Details Name: [name] MRN: [id number] DOB: 1955/10/13 Today's Date: 11/23/2023  History of Present Illness  68 y.o. female presents to [facility] 11/21/23 with unintentional weight loss, weakness, and persistent diarrhea. + C.diff. CT showed nonobstructing stone. PMHx: GAD, MDD, Fibromyalgia, HLD, HTN, Morbid obesity, Myelodysplastic disorder, PE, CVA, CKD 3a and thyroid  tumor   Clinical Impression  PTA pt ambulated with no AD in the home and used a SP cane in the community. Pt reported being slightly unsteady at baseline and has chairs set up throughout the house to take seated rest breaks as needed. Pt presents close to mobility baseline requiring CGA to stand with no AD and MinA/CGA to ambulate 2x80 ft. Pt had an antalgic gait pattern which she attributed to acute on chronic back pain. Pt was unsteady and would drift right/left with MinA to correct one small loss of balance. Discussed using an AD to improve stability with pt verbalizing agreement. Pt will have intermittent assist available upon d/c home. Recommending follow-up PT, however, pt declines at this time. Will continue to follow acutely to address functional impairments.       If plan is discharge home, recommend the following: A little help with walking and/or transfers;A little help with bathing/dressing/bathroom;Assist for transportation;Help with stairs or ramp for entrance   Can travel by private vehicle    Yes    Equipment Recommendations None recommended by PT (owns equipment)     Functional Status Assessment Patient has had a recent decline in their functional status and demonstrates the ability to make significant improvements in function in a reasonable and predictable amount of time.     Precautions / Restrictions Precautions Precautions: Fall Recall of Precautions/Restrictions: Intact Restrictions Weight Bearing Restrictions Per Provider Order: No      Mobility  Bed  Mobility Overal bed mobility: Needs Assistance Bed Mobility: Supine to Sit    Supine to sit: Supervision, HOB elevated    General bed mobility comments: increased time and effort due to pain in low back    Transfers Overall transfer level: Needs assistance Equipment used: None Transfers: Sit to/from Stand Sit to Stand: Contact guard assist  General transfer comment: slight unsteadiness with raise, CGA for safety    Ambulation/Gait Ambulation/Gait assistance: Contact guard assist, Min assist Gait Distance (Feet): 80 Feet (x2) Assistive device: None Gait Pattern/deviations: Step-through pattern, Decreased stance time - left, Drifts right/left, Antalgic Gait velocity: decr     General Gait Details: antalgic gait pattern with pt drifting right/left. Slightly unsteady with pt reaching for UE support from hallway rails if available. One standing rest break due to an increase in back pain     Balance Overall balance assessment: Mild deficits observed, not formally tested        Pertinent Vitals/Pain Pain Assessment Pain Assessment: Faces Faces Pain Scale: Hurts little more Pain Location: bladder and low back Pain Descriptors / Indicators: Aching, Discomfort Pain Intervention(s): Limited activity within patient's tolerance, Monitored during session, Repositioned    Home Living Family/patient expects to be discharged to:: Private residence Living Arrangements: Spouse/significant other Available Help at Discharge: Family;Available PRN/intermittently Type of Home: House Home Access: Stairs to enter Entrance Stairs-Rails: None Entrance Stairs-Number of Steps: 4 Alternate Level Stairs-Number of Steps: 20 Home Layout: Two level;Bed/bath upstairs Home Equipment: Shower seat - built in;Grab bars - tub/shower;Rolling Walker (2 wheels);Cane - single point      Prior Function Prior Level of Function : Independent/Modified Independent;Driving    Mobility Comments: Ind  with no AD,  SP cane in the community ADLs Comments: Ind     Extremity/Trunk Assessment   Upper Extremity Assessment Upper Extremity Assessment: Defer to OT evaluation    Lower Extremity Assessment Lower Extremity Assessment: Generalized weakness    Cervical / Trunk Assessment Cervical / Trunk Assessment: Normal  Communication   Communication Communication: No apparent difficulties    Cognition Arousal: Alert Behavior During Therapy: WFL for tasks assessed/performed   PT - Cognitive impairments: No apparent impairments    Following commands: Intact       Cueing Cueing Techniques: Verbal cues     General Comments General comments (skin integrity, edema, etc.): VSS on RA     PT Assessment Patient needs continued PT services  PT Problem List Decreased strength;Decreased activity tolerance;Decreased balance;Decreased mobility       PT Treatment Interventions DME instruction;Gait training;Stair training;Functional mobility training;Therapeutic activities;Therapeutic exercise;Balance training;Neuromuscular re-education;Patient/family education    PT Goals (Current goals can be found in the Care Plan section)  Acute Rehab PT Goals Patient Stated Goal: to feel better PT Goal Formulation: With patient Time For Goal Achievement: 12/07/23 Potential to Achieve Goals: Good    Frequency Min 2X/week        AM-PAC PT 6 Clicks Mobility  Outcome Measure Help needed turning from your back to your side while in a flat bed without using bedrails?: A Little Help needed moving from lying on your back to sitting on the side of a flat bed without using bedrails?: A Little Help needed moving to and from a bed to a chair (including a wheelchair)?: A Little Help needed standing up from a chair using your arms (e.g., wheelchair or bedside chair)?: A Little Help needed to walk in hospital room?: A Little Help needed climbing 3-5 steps with a railing? : A Little 6 Click Score: 18    End of  Session   Activity Tolerance: Patient tolerated treatment well Patient left: in chair;with call bell/phone within reach Nurse Communication: Mobility status PT Visit Diagnosis: Unsteadiness on feet (R26.81);Other abnormalities of gait and mobility (R26.89);Muscle weakness (generalized) (M62.81)    Time: 0811-0828 PT Time Calculation (min) (ACUTE ONLY): 17 min   Charges:   PT Evaluation $PT Eval Low Complexity: 1 Low   PT General Charges $$ ACUTE PT VISIT: 1 Visit       Kate ORN, PT, DPT Secure Chat Preferred  Rehab Office [phone]   Kate BRAVO Wendolyn 11/23/2023, 8:36 AM

## 2023-11-23 NOTE — Assessment & Plan Note (Addendum)
-   Normalized

## 2023-11-23 NOTE — Plan of Care (Signed)
  Problem: Health Behavior/Discharge Planning: Goal: Ability to manage health-related needs will improve Outcome: Progressing   Problem: Clinical Measurements: Goal: Will remain free from infection Outcome: Progressing   Problem: Clinical Measurements: Goal: Diagnostic test results will improve Outcome: Progressing   Problem: Clinical Measurements: Goal: Respiratory complications will improve Outcome: Progressing   

## 2023-11-23 NOTE — Progress Notes (Addendum)
 Daily Progress Note Intern Pager: [phone]  Patient name: [name] Medical record number: [id number] Date of birth: 05/15/1955 Age: 68 y.o. Gender: female  Primary Care Provider: Zachary Lamar BRAVO, NP Consultants: Oncology Code Status: Full  Pt Overview and Major Events to Date:  11/18: Admitted for electrolyte derangements in the setting of severe diarrhea and unintentional weight loss  11/19: 10 day treatment of C. Diff begins  Assessment and Plan:  [name] is a 68 y.o. female electrolyte derangements in the setting of severe diarrhea and unintentional weight loss.   Hospital course has been complicated by positive C. difficile infection and possible passage of renal calculi and increased pain.   Assessment & Plan C. difficile diarrhea Positive C. difficile antigen and PCR.  Most likely from recent recurrent antibiotic use for UTI. 1 episode of loose diarrhea last night, patient states she is noticing episodes slowing down and firming up.  - Oral fidaxomicin  200 mg twice daily for 10 days 11/19 - 11/28 - Since able to tolerate clear liquid diet, upgraded to soft foods today UTI (urinary tract infection) Positive UA overnight.  - Will treat with ceftriaxone  once urine culture collected Myelodysplasia (myelodysplastic syndrome) (HCC) Weight loss, unintentional WBC count improving to 2.9 today with treatment of C. difficile. - PT/OT eval - RD eval, appreciate recs - Consulted oncology Dr. Autumn, appreciate recommendations  - Revlimid  is not the cause for the neutropenia   - Most likely related to inflammatory process from c-diff infection   - Treat c-diff with expectations of improvement in neutropenia   - Close lab monitoring  Electrolyte abnormality (Resolved: 11/23/2023) Normalized. QT prolongation Improving to 480 today. - Avoid Qtc prolonging medications  - IM Tigan  q6h PRN for nausea (low risk for qtc prolongation) - Repeat EKG in AM -  Continuous telemetry monitoring  - Do not resume SSRIs until improved Chronic kidney disease, stage 3a (HCC) New diagnosis of CKD Stage 3a. Improving. 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 Continue home Norco 10-325mg  q6h PRN for chronic back pain Dry eyes: Continue home refresh ophthalmic drops  FEN/GI: Clear liquids, upgrade as tolerated PPx: Xarelto  20mg  daily Dispo:Home pending clinical improvement .   Subjective:  Patient was seen and examined at bedside. ON events: patient had flank pain, most likely passing non-obstructing stone found on CT on admission. Pain adequately controlled. This AM worsening suprapubic pain. No N/V. Only 1 episode of loose diarrhea yesterday.   Objective: Temp:  [97.9 F (36.6 C)-99.1 F (37.3 C)] 98.6 F (37 C) (11/20 0400) Pulse Rate:  [78-82] 78 (11/19 1030) Resp:  [12-19] 18 (11/20 0400) BP: (97-140)/(60-96) 122/96 (11/20 0400) SpO2:  [99 %-100 %] 99 % (11/19 1030) Physical Exam: General: uncomfortable appearing obese female  Cardiovascular: RRR, no m/r/g, 2+ radial pulses Respiratory: CTAB, normal WOB on RA, no w/r/r Abdomen: soft, moderate diffuse tenderness, worse in suprapubic area, non-distended Extremities: no peripheral edema  Laboratory: Most recent CBC Lab Results  Component Value  Date   WBC 2.9 (L) 11/23/2023   HGB 9.2 (L) 11/23/2023   HCT 28.9 (L) 11/23/2023   MCV 87.8 11/23/2023   PLT 120 (L) 11/23/2023   Most recent BMP     Latest Ref Rng & Units 11/23/2023    3:02 AM  BMP  Glucose 70 - 99 mg/dL 90   BUN 8 - 23 mg/dL <5   Creatinine 9.55 - 1.00 mg/dL 9.03   Sodium 864 - 854 mmol/L 134   Potassium 3.5 - 5.1 mmol/L 4.6   Chloride 98 - 111 mmol/L 98   CO2 22 - 32 mmol/L 28   Calcium 8.9 - 10.3 mg/dL 7.9    Lupie Credit, DO 11/23/2023, 7:25 AM  PGY-1, [facility] FPTS Intern pager: [phone], text pages welcome Secure chat group [facility] Teaching Service

## 2023-11-23 NOTE — Assessment & Plan Note (Addendum)
 Positive C. difficile antigen and PCR.  Most likely from recent recurrent antibiotic use for UTI. 1 episode of loose diarrhea last night, patient states she is noticing episodes slowing down and firming up.  - Oral fidaxomicin  200 mg twice daily for 10 days 11/19 - 11/28 - Since able to tolerate clear liquid diet, upgraded to soft foods today

## 2023-11-24 ENCOUNTER — Telehealth: Payer: Self-pay

## 2023-11-24 DIAGNOSIS — A0472 Enterocolitis due to Clostridium difficile, not specified as recurrent: Secondary | ICD-10-CM | POA: Diagnosis not present

## 2023-11-24 LAB — CBC
HCT: 30.1 % — ABNORMAL LOW (ref 36.0–46.0)
Hemoglobin: 9.2 g/dL — ABNORMAL LOW (ref 12.0–15.0)
MCH: 27.1 pg (ref 26.0–34.0)
MCHC: 30.6 g/dL (ref 30.0–36.0)
MCV: 88.5 fL (ref 80.0–100.0)
Platelets: 135 10*3/uL — ABNORMAL LOW (ref 150–400)
RBC: 3.4 MIL/uL — ABNORMAL LOW (ref 3.87–5.11)
RDW: 21.5 % — ABNORMAL HIGH (ref 11.5–15.5)
WBC: 3.2 10*3/uL — ABNORMAL LOW (ref 4.0–10.5)
nRBC: 0 % (ref 0.0–0.2)

## 2023-11-24 LAB — BASIC METABOLIC PANEL WITH GFR
Anion gap: 11 (ref 5–15)
BUN: <5 mg/dL — ABNORMAL LOW (ref 8–23)
CO2: 24 mmol/L (ref 22–32)
Calcium: 7.9 mg/dL — ABNORMAL LOW (ref 8.9–10.3)
Chloride: 99 mmol/L (ref 98–111)
Creatinine, Ser: 1.11 mg/dL — ABNORMAL HIGH (ref 0.44–1.00)
GFR, Estimated: 54 mL/min — ABNORMAL LOW (ref >60)
Glucose, Bld: 98 mg/dL (ref 70–99)
Potassium: 3.2 mmol/L — ABNORMAL LOW (ref 3.5–5.1)
Sodium: 134 mmol/L — ABNORMAL LOW (ref 135–145)

## 2023-11-24 LAB — MAGNESIUM: Magnesium: 1.8 mg/dL (ref 1.7–2.4)

## 2023-11-24 LAB — PHOSPHORUS: Phosphorus: 4.4 mg/dL (ref 2.5–4.6)

## 2023-11-24 MED ORDER — COLESTIPOL HCL 1 G PO TABS: 1.0000 g | ORAL_TABLET | Freq: Every day | ORAL | Status: DC | PRN

## 2023-11-24 MED ORDER — SIMVASTATIN 20 MG PO TABS
20.0000 mg | ORAL_TABLET | Freq: Every day | ORAL | Status: DC
  Administered 2023-11-24: 20 mg via ORAL
  Filled 2023-11-24: qty 1

## 2023-11-24 MED ORDER — SODIUM CHLORIDE 0.9 % IV SOLN
2.0000 g | INTRAVENOUS | Status: DC
  Administered 2023-11-24: 2 g via INTRAVENOUS
  Filled 2023-11-24: qty 20

## 2023-11-24 MED ORDER — ALLOPURINOL 100 MG PO TABS
100.0000 mg | ORAL_TABLET | Freq: Every day | ORAL | Status: DC
  Administered 2023-11-24 – 2023-11-25 (×2): 100 mg via ORAL
  Filled 2023-11-24 (×2): qty 1

## 2023-11-24 MED ORDER — COLESTIPOL HCL 1 G PO TABS
1.0000 g | ORAL_TABLET | Freq: Three times a day (TID) | ORAL | Status: DC
  Administered 2023-11-24 – 2023-11-25 (×3): 1 g via ORAL
  Filled 2023-11-24 (×4): qty 1

## 2023-11-24 MED ORDER — ESCITALOPRAM OXALATE 20 MG PO TABS
20.0000 mg | ORAL_TABLET | Freq: Every day | ORAL | Status: DC
  Administered 2023-11-24 – 2023-11-25 (×2): 20 mg via ORAL
  Filled 2023-11-24 (×2): qty 1

## 2023-11-24 MED ORDER — MAGNESIUM SULFATE IN D5W 1-5 GM/100ML-% IV SOLN
1.0000 g | Freq: Once | INTRAVENOUS | Status: AC
  Administered 2023-11-24: 1 g via INTRAVENOUS
  Filled 2023-11-24: qty 100

## 2023-11-24 MED ORDER — POTASSIUM CHLORIDE CRYS ER 20 MEQ PO TBCR
40.0000 meq | EXTENDED_RELEASE_TABLET | ORAL | Status: AC
  Administered 2023-11-24 (×2): 40 meq via ORAL
  Filled 2023-11-24 (×2): qty 2

## 2023-11-24 NOTE — Assessment & Plan Note (Addendum)
 Positive C. difficile antigen and PCR. Most likely from recent recurrent antibiotic use for UTI. 2 episodes of loose diarrhea last night, patient has tolerated soft diet without N/V. - Continue oral fidaxomicin  200 mg twice daily for 10 days (11/19 - 11/28) - Since able to tolerate clear liquid diet, upgraded to soft foods today - RD eval, appreciate recs   - Continue Florastor probiotic

## 2023-11-24 NOTE — Assessment & Plan Note (Addendum)
 New diagnosis of CKD Stage 3a. Improving. - Renal CT stone study with evidence of multiple stones - Catch stone with hat, will test if passed - Flomax , Pyridium  PRN for pain control - Encourage oral hydration

## 2023-11-24 NOTE — Assessment & Plan Note (Addendum)
 WBC count improving to 3.2 today with treatment of C. Difficile and UTI. - Continue working with PT/OT  - Consulted oncology Dr. Autumn, appreciate recommendations  - Revlimid  is not the cause for the neutropenia   - Most likely related to inflammatory process from c-diff infection   - Treat c-diff with expectations of improvement in neutropenia   - Close lab monitoring

## 2023-11-24 NOTE — Progress Notes (Signed)
 Daily Progress Note Intern Pager: [phone]  Patient name: [name] Medical record number: [id number] Date of birth: 21-Nov-1955 Age: 68 y.o. Gender: female  Primary Care Provider: Zachary Lamar BRAVO, NP Consultants: Oncology Code Status: Full  Pt Overview and Major Events to Date:  11/18: Admitted for electrolyte derangements in the setting of severe diarrhea and unintentional weight loss  11/19: 10 day treatment of C. Diff begins 11/20: Concurrent treatment of UTI with ceftriaxone  begins  Assessment and Plan:  [name] is a 68 y.o. female electrolyte derangements in the setting of severe diarrhea and unintentional weight loss.   Hospital course has been complicated by positive C. difficile infection as well as positive UTI with suprapubic and flank pain.  Assessment & Plan C. difficile diarrhea Positive C. difficile antigen and PCR. Most likely from recent recurrent antibiotic use for UTI. 2 episodes of loose diarrhea last night, patient has tolerated soft diet without N/V. - Continue oral fidaxomicin  200 mg twice daily for 10 days (11/19 - 11/28) - Since able to tolerate clear liquid diet, upgraded to soft foods today - RD eval, appreciate recs   - Continue Florastor probiotic UTI (urinary tract infection) Stable, patient states suprapubic pain is much better. - Urine culture with <100k colonies of gram negative rods - Awaiting sensitivities Myelodysplasia (myelodysplastic syndrome) (HCC) Weight loss, unintentional WBC count improving to 3.2 today with treatment of C. Difficile and UTI. - Continue working with PT/OT  - Consulted oncology Dr. Autumn, appreciate recommendations  - Revlimid  is not the cause for the neutropenia   - Most likely related to inflammatory process from c-diff infection   - Treat c-diff with expectations of improvement in neutropenia   - Close lab monitoring  QT prolongation Improved to 479 today. - Discontinue repeat EKGs -  Restart home SSRI Chronic kidney disease, stage 3a (HCC) New diagnosis of CKD Stage 3a. Improving. 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 Continue home Norco 10-325mg  q6h PRN for chronic back pain Dry eyes: Continue home refresh ophthalmic drops Bile acid diarrhea: continue home colestipol  Anxiety/depression: continue home lexapro   FEN/GI: Regular diet as tolerated  PPx: Xarelto  20mg  Dispo:Home tomorrow.   Subjective:  Patient was seen and examined at bedside. She states she is doing much better pain wise today and that her suprapubic pain is almost completely gone. She is able to void without difficulty or much pain now. No other complaints today.  Objective: Temp:  [97.3 F (36.3 C)-99.1 F (37.3 C)] 97.3 F (36.3 C) (11/21 0516) Pulse Rate:  [75-81] 78 (11/21 0516) Resp:  [14-18] 14 (11/21 0516) BP: (114-132)/(53-98) 132/82 (11/21 0516) SpO2:  [83 %-100 %] 100 % (11/21 0516) Physical Exam: General: patient lying comfortable flat in hospital bed in no acute distress Cardiovascular: RRR, no m/r/g Respiratory: CTAB, normal WOB on RA Abdomen: soft, mildly tender, non-distended Extremities: no peripheral edema  Laboratory: Most recent CBC Lab Results  Component Value Date   WBC 3.2 (L) 11/24/2023   HGB 9.2 (L) 11/24/2023   HCT 30.1 (L) 11/24/2023   MCV 88.5 11/24/2023   PLT  135 (L) 11/24/2023   Most recent BMP    Latest Ref Rng & Units 11/24/2023    3:23 AM  BMP  Glucose 70 - 99 mg/dL 98   BUN 8 - 23 mg/dL <5   Creatinine 9.55 - 1.00 mg/dL 8.88   Sodium 864 - 854  mmol/L 134   Potassium 3.5 - 5.1 mmol/L 3.2   Chloride 98 - 111 mmol/L 99   CO2 22 - 32 mmol/L 24   Calcium 8.9 - 10.3 mg/dL 7.9    Lupie Credit, DO 11/24/2023, 8:00 AM  PGY-1, [place] [facility] FPTS Intern pager: [phone], text pages welcome Secure chat group [facility] Teaching Service

## 2023-11-24 NOTE — Plan of Care (Signed)

## 2023-11-24 NOTE — Assessment & Plan Note (Addendum)
 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 Continue home Norco 10-325mg  q6h PRN for chronic back pain Dry eyes: Continue home refresh ophthalmic drops Bile acid diarrhea: continue home colestipol  Anxiety/depression: continue home lexapro 

## 2023-11-24 NOTE — Discharge Summary (Signed)
 Family Medicine Teaching [facility] Discharge Summary  Patient name: [name] Medical record number: [id number] Date of birth: June 25, 1955 Age: 68 y.o. 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 Her hospital course is detailed below:  Electrolyte abnormality Patient admitted for electrolyte derangements of K/Mg 2.5/1.5 in the setting of severe chronic diarrhea and unintentional weight loss x2 months. She was given 1L LR bolus, 20mEq IV K and 2g IV Mg in the ED. Electrolytes were repleted as indicated throughout hospital course, patient was monitored closely for refeeding syndrome. Electrolytes normalized before discharge.    Myelodysplasia (myelodysplastic syndrome) (HCC) Weight loss, unintentional Chronic diarrhea Patient endorses similar episodes of diarrhea during prior courses of Revlimid  since 2019, at least yearly per chart review. Patient has been on current course of Revlimid  for 6 months at this time, 2 of which have been complicated by severe, watery, non-bloody diarrhea. She has previously taken colestipol  to help. Underlying conditions for unintentional weight loss werew evaluated with a labs including HIV Ab, syphillis, B12, thiamine, A1C, lipid panel all were WNL. Oncology was consulted and recommended continuing medications with outpatient follow up. Stool studies were positive for C diff infection. She was treated with  fidaxomicin  200 mg BID for 10 days (11/19-11/28).   Dysuria:  Patient noted to have increase pain with urination. CT on admission showed large renal stone. To rule out stone obstruction, CT stone was obtained and did not show obstruction. UA positive for UTI and she was started on CTX (11/20-11/21). Urine culture sensitivities resulted with many strains of resistance. She was started on Augmentin  875 mg q12h for 5 days at discharge. Will need outpatient follow up with urology due to having multiple complex UTIs.   Prolonged Q-T interval on ECG QT /Qtc 584/635 on admission EKG. Patient was placed on continuous telemetry monitoring. Qtc prolonging medications were avoided throughout hospital course. Qtc normalized by time of discharge.  Elevated serum creatinine New diagnosis of CKD Stage 3a with 1.16 on admission, GFR 52 and elevated creatinine since 2023. Cr at discharge 1.10.   Other chronic conditions were medically managed with home medications and formulary alternatives as necessary (HTN, HLD, CAD, CVA, GERD, chronic lumbar pain)  PCP Follow-up Recommendations: Follow up with oncology  Follow up with nephrology for new diagnosis of CKD Stage 3a Follow-up with urology for recurrent renal calculi, large stone needing lithotripsy Recommend sleep study to evaluate for OSA. Last sleep study conducted in 2018.  Follow up urology for multiple complex UTIs   Disposition: home   Discharge Condition: stable   Discharge Exam:  General: Chronically ill-appearing, no acute distress Cardio: RRR, no murmur on exam Pulm: clear, No increased work of breathing Abdomen: Soft, bowel sounds present, nontender Extremity: No peripheral edema   Significant Procedures: none   Significant Labs and Imaging:  Recent Labs  Lab 11/24/23 0323 11/25/23 0407 11/25/23 0659  WBC 3.2* 2.3* 1.9*  HGB 9.2* 7.9* 8.1*  HCT 30.1* 25.3* 26.3*  PLT 135* 111* 108*  Recent Labs  Lab 11/24/23 0323  11/25/23 0407  NA 134* 137  K 3.2* 4.6  CL 99 103  CO2 24 25  GLUCOSE 98 85  BUN <5* <5*  CREATININE 1.11* 1.10*  CALCIUM 7.9* 8.0*  MG 1.8  --   PHOS 4.4  --    CT Renal Stone:  IMPRESSION: 1. 13 mm nonobstructive left renal calculus and 4 mm nonobstructive right renal calculus. 2. Hepatic steatosis. 3. Aortic atherosclerosis.  CXR:  No acute cardiopulmonary findings   CTCAP:  IMPRESSION: 1. No acute findings in the chest, abdomen or pelvis. 2. Few tiny subcentimeter bilateral peripheral pulmonary nodules with the largest measuring 5 mm over the medial left lower lobe without significant change compared to August 2024. This could be considered benign. No follow-up imaging recommended. 3. 3.9 cm lower pole left renal cyst. 1.3 cm nonobstructing left renal stone. 4. Mild anterior wedging of T6 new since 2016, although age indeterminate. Stable L4 compression fracture. L2-3 fusion hardware intact. 5. Aortic atherosclerosis. Atherosclerotic coronary artery disease.  CT Soft tissue Neck: IMPRESSION: 1. Asymmetric appearance of the left piriform sinus with possible abnormal soft tissue. Recommend correlation with direct visualization. 2. No CT evidence of epiglottitis or tonsillitis. 3. Anterior cervical fusion hardware from C4 to C7 with no solid osseous fusion at C4C5 and lucency surrounding the interbody spacer, possibly reflecting pseudarthrosis formation.  CT Head wo Con:  IMPRESSION: 1. No acute intracranial hemorrhage or demarcated transcortical infarction. 2. No substantial change since 07/10/2020.  Results/Tests Pending at Time of Discharge: none   Discharge Medications:  Allergies as of 11/25/2023       Reactions   Pineapple Extract Anaphylaxis, Other (See Comments)   Shellfish Protein-containing Drug Products Swelling, Anaphylaxis   Shellfish Allergy Other (See Comments)        Medication List     PAUSE taking these medications    potassium  chloride SA 20 MEQ tablet Wait to take this until your doctor or other care provider tells you to start again. Commonly known as: KLOR-CON  M Take 20 mEq by mouth 2 (two) times daily.   topiramate 50 MG tablet Wait to take this until your doctor or other care provider tells you to start again. Commonly known as: TOPAMAX Take 50 mg by mouth daily.       STOP taking these medications    doxylamine (Sleep) 25 MG tablet Commonly known as: UNISOM   gabapentin  600 MG tablet Commonly known as: NEURONTIN  Replaced by: gabapentin  300 MG capsule   HYDROcodone -acetaminophen  10-325 MG tablet Commonly known as: NORCO   ondansetron  4 MG disintegrating tablet Commonly known as: ZOFRAN -ODT   progesterone 200 MG capsule Commonly known as: PROMETRIUM   sertraline  100 MG tablet Commonly known as: ZOLOFT    [place] 45 MG Tabs Generic drug: Fezolinetant       TAKE these medications    acetaminophen  500 MG tablet Commonly known as: TYLENOL  Take 2 tablets (1,000 mg total) by mouth every 6 (six) hours as needed for mild pain (pain score 1-3) or moderate pain (pain score 4-6).   allopurinol  100 MG tablet Commonly known as: ZYLOPRIM  Take 1 tablet by mouth daily.   amoxicillin -clavulanate 875-125 MG tablet Commonly known as: AUGMENTIN  Take 1 tablet by mouth 2 (two) times daily for 5 days.   atenolol  50 MG tablet Commonly known as: TENORMIN  Take 50 mg by mouth daily.   colestipol  1 g tablet Commonly known as: COLESTID  Take 1 g by mouth See admin  instructions. Take one tablet (1g) by mouth up to four time a day with meals, and number of bowel movements daily.   cyanocobalamin  1000 MCG/ML injection Commonly known as: VITAMIN B12 Inject 1,000 mcg into the muscle every 30 (thirty) days.   escitalopram  20 MG tablet Commonly known as: LEXAPRO  TAKE 1 TABLET BY MOUTH ONCE DAILY; Duration: 90   fidaxomicin  200 MG Tabs tablet Commonly known as: DIFICID  Take 1 tablet (200 mg total) by  mouth 2 (two) times daily for 6 days.   folic acid  1 MG tablet Commonly known as: FOLVITE  Take 1 mg by mouth daily.   gabapentin  300 MG capsule Commonly known as: NEURONTIN  Take 1 capsule (300 mg total) by mouth 3 (three) times daily. Replaces: gabapentin  600 MG tablet   lenalidomide  5 MG capsule Commonly known as: REVLIMID  TAKE 1 CAPSULE BY MOUTH DAILY  FOR 28 DAYS What changed: See the new instructions.   Lubricant Eye PM Oint Place 1 application  into both eyes at bedtime.   Multivitamin Gummies Womens Chew Chew 2 each by mouth in the morning.   Oxycodone  HCl 10 MG Tabs Take 1 tablet (10 mg total) by mouth every 6 (six) hours as needed for severe pain (pain score 7-10).   pantoprazole  40 MG tablet Commonly known as: PROTONIX  Take 40 mg by mouth daily.   Refresh 1.4-0.6 % ophthalmic solution Generic drug: polyvinyl alcohol -povidone Place 1-2 drops into both eyes as needed (for dry eyes).   rivaroxaban  20 MG Tabs tablet Commonly known as: XARELTO  Take 20 mg by mouth at bedtime.   saccharomyces boulardii 250 MG capsule Commonly known as: FLORASTOR Take 1 capsule (250 mg total) by mouth 2 (two) times daily.   simvastatin  20 MG tablet Commonly known as: ZOCOR  Take 20 mg by mouth at bedtime.   SUMAtriptan  25 MG tablet Commonly known as: IMITREX  1 tablet at least 2 hours between doses as needed   Super Calcium 1500 (600 Ca) MG Tabs tablet Generic drug: calcium carbonate 1 tablet with meals   Vitamin D (Cholecalciferol) 25 MCG (1000 UT) Caps Take 1,000 Units by mouth in the morning.        Discharge Instructions: Please refer to Patient Instructions section of EMR for full details.  Patient was counseled important signs and symptoms that should prompt return to medical care, changes in medications, dietary instructions, activity restrictions, and follow up appointments.   Follow-Up Appointments:   Cleotilde Perkins, DO 11/25/2023, 12:11 PM PGY-3, [facility]  Family Medicine

## 2023-11-24 NOTE — Telephone Encounter (Signed)
 Pt called to let us  know she is still in the hospital. I have c-diff. The diarrhea is down to about twice daily. I', hoping to be discharged in next couple of days. They are giving me potassium in my  IV & pills. My hemoglobin is 11.9-.  She was excited about that. However I see this morning her Hgb is 9.2.  She will call us  to R/S her appt's when she is discharged & no longer dealing with the c diff. Message sent to Dr Ezzard, Devere Afton LIED, & [facility].

## 2023-11-24 NOTE — Assessment & Plan Note (Addendum)
 Improved to 479 today. - Discontinue repeat EKGs - Restart home SSRI

## 2023-11-24 NOTE — Assessment & Plan Note (Addendum)
 Stable, patient states suprapubic pain is much better. - Urine culture with <100k colonies of gram negative rods - Awaiting sensitivities

## 2023-11-25 ENCOUNTER — Other Ambulatory Visit (HOSPITAL_COMMUNITY): Payer: Self-pay

## 2023-11-25 ENCOUNTER — Encounter: Payer: Self-pay | Admitting: Oncology

## 2023-11-25 DIAGNOSIS — A0472 Enterocolitis due to Clostridium difficile, not specified as recurrent: Secondary | ICD-10-CM | POA: Diagnosis not present

## 2023-11-25 LAB — BASIC METABOLIC PANEL WITH GFR
Anion gap: 9 (ref 5–15)
BUN: <5 mg/dL — ABNORMAL LOW (ref 8–23)
CO2: 25 mmol/L (ref 22–32)
Calcium: 8 mg/dL — ABNORMAL LOW (ref 8.9–10.3)
Chloride: 103 mmol/L (ref 98–111)
Creatinine, Ser: 1.1 mg/dL — ABNORMAL HIGH (ref 0.44–1.00)
GFR, Estimated: 55 mL/min — ABNORMAL LOW (ref >60)
Glucose, Bld: 85 mg/dL (ref 70–99)
Potassium: 4.6 mmol/L (ref 3.5–5.1)
Sodium: 137 mmol/L (ref 135–145)

## 2023-11-25 LAB — CBC WITH DIFFERENTIAL/PLATELET
Abs Immature Granulocytes: 0.01 K/uL (ref 0.00–0.07)
Basophils Absolute: 0.2 K/uL — ABNORMAL HIGH (ref 0.0–0.1)
Basophils Absolute: 0.2 K/uL — ABNORMAL HIGH (ref 0.0–0.1)
Basophils Relative: 7 %
Basophils Relative: 9 %
Eosinophils Absolute: 0 K/uL (ref 0.0–0.5)
Eosinophils Absolute: 0 K/uL (ref 0.0–0.5)
Eosinophils Relative: 1 %
Eosinophils Relative: 1 %
HCT: 25.3 % — ABNORMAL LOW (ref 36.0–46.0)
HCT: 26.3 % — ABNORMAL LOW (ref 36.0–46.0)
Hemoglobin: 7.9 g/dL — ABNORMAL LOW (ref 12.0–15.0)
Hemoglobin: 8.1 g/dL — ABNORMAL LOW (ref 12.0–15.0)
Immature Granulocytes: 0 %
Lymphocytes Relative: 52 %
Lymphocytes Relative: 55 %
Lymphs Abs: 1.2 K/uL (ref 0.7–4.0)
Lymphs Abs: 1 K/uL (ref 0.7–4.0)
MCH: 27.5 pg (ref 26.0–34.0)
MCH: 26.8 pg (ref 26.0–34.0)
MCHC: 31.2 g/dL (ref 30.0–36.0)
MCHC: 30.8 g/dL (ref 30.0–36.0)
MCV: 88.2 fL (ref 80.0–100.0)
MCV: 87.1 fL (ref 80.0–100.0)
Monocytes Absolute: 0.5 K/uL (ref 0.1–1.0)
Monocytes Absolute: 0.2 K/uL (ref 0.1–1.0)
Monocytes Relative: 21 %
Monocytes Relative: 8 %
Neutro Abs: 0.4 K/uL — CL (ref 1.7–7.7)
Neutro Abs: 0.5 K/uL — ABNORMAL LOW (ref 1.7–7.7)
Neutrophils Relative %: 19 %
Neutrophils Relative %: 27 %
Platelets: 111 K/uL — ABNORMAL LOW (ref 150–400)
Platelets: 108 K/uL — ABNORMAL LOW (ref 150–400)
RBC: 2.87 MIL/uL — ABNORMAL LOW (ref 3.87–5.11)
RBC: 3.02 MIL/uL — ABNORMAL LOW (ref 3.87–5.11)
RDW: 21.6 % — ABNORMAL HIGH (ref 11.5–15.5)
RDW: 21.7 % — ABNORMAL HIGH (ref 11.5–15.5)
WBC: 2.3 K/uL — ABNORMAL LOW (ref 4.0–10.5)
WBC: 1.9 K/uL — ABNORMAL LOW (ref 4.0–10.5)
nRBC: 0 % (ref 0.0–0.2)
nRBC: 0 % (ref 0.0–0.2)

## 2023-11-25 LAB — URINE CULTURE: Culture: >=100000 — AB

## 2023-11-25 MED ORDER — SACCHAROMYCES BOULARDII 250 MG PO CAPS
250.0000 mg | ORAL_CAPSULE | Freq: Two times a day (BID) | ORAL | 0 refills | Status: AC
  Filled 2023-11-25: qty 30, 15d supply, fill #0

## 2023-11-25 MED ORDER — AMOXICILLIN-POT CLAVULANATE 875-125 MG PO TABS
1.0000 | ORAL_TABLET | Freq: Two times a day (BID) | ORAL | 0 refills | Status: AC
  Filled 2023-11-25: qty 10, 5d supply, fill #0

## 2023-11-25 MED ORDER — GABAPENTIN 300 MG PO CAPS
300.0000 mg | ORAL_CAPSULE | Freq: Three times a day (TID) | ORAL | 0 refills | Status: AC
  Filled 2023-11-25: qty 60, 20d supply, fill #0

## 2023-11-25 MED ORDER — FIDAXOMICIN 200 MG PO TABS
200.0000 mg | ORAL_TABLET | Freq: Two times a day (BID) | ORAL | 0 refills | Status: AC
  Filled 2023-11-25: qty 12, 6d supply, fill #0

## 2023-11-25 MED ORDER — ACETAMINOPHEN 500 MG PO TABS: 1000.0000 mg | ORAL_TABLET | Freq: Four times a day (QID) | ORAL | Status: AC | PRN

## 2023-11-25 MED ORDER — OXYCODONE HCL 10 MG PO TABS
10.0000 mg | ORAL_TABLET | Freq: Four times a day (QID) | ORAL | 0 refills | Status: AC | PRN
  Filled 2023-11-25: qty 5, 2d supply, fill #0

## 2023-11-25 NOTE — Plan of Care (Signed)
  Problem: Health Behavior/Discharge Planning: Goal: Ability to manage health-related needs will improve Outcome: Progressing   Problem: Clinical Measurements: Goal: Will remain free from infection Outcome: Progressing   Problem: Pain Managment: Goal: General experience of comfort will improve and/or be controlled Outcome: Progressing

## 2023-11-28 NOTE — Progress Notes (Signed)
 Patient discharged home, Important Message Letter mailed to patient.

## 2023-11-28 NOTE — Care Management Important Message (Signed)
 Important Message  Patient Details  Name: [name] MRN: [id number] Date of Birth: 08-29-1955   Important Message Given:  No     [name] 11/28/2023, 8:47 AM

## 2023-11-29 ENCOUNTER — Telehealth: Payer: Self-pay

## 2023-11-29 LAB — LAB REPORT - SCANNED
A1c: 5.1
EGFR (Non-African Amer.): 50

## 2023-11-29 NOTE — Telephone Encounter (Signed)
 Pt called to see when Dr Ezzard would like to see her? She will finished with all meds for the c-diff by Friday. She saw her PCP today, who encouraged her to get an appt with us  soon.Pt states her Hgb was 8'ish when she was discharged.

## 2023-12-02 LAB — VITAMIN B1: Vitamin B1 (Thiamine): 34.6 nmol/L — ABNORMAL LOW (ref 66.5–200.0)

## 2023-12-04 ENCOUNTER — Telehealth: Payer: Self-pay | Admitting: Oncology

## 2023-12-04 NOTE — Telephone Encounter (Signed)
 Patient has been scheduled. Aware of appt date and time.   Scheduling Message Entered by Chadron, AMY W on 12/04/2023 at 10:58 AM Priority: High EST PT 15  Department: [facility]  Provider: Ezzard Valaria LABOR, MD  Appointment Notes:  Needs appt for labs, Lewis and injection this week.

## 2023-12-04 NOTE — Telephone Encounter (Signed)
 Dr Ezzard wants to see pt with labs, and office visit this week (per discussion on Wed, 11/29/2023).

## 2023-12-05 ENCOUNTER — Other Ambulatory Visit (HOSPITAL_COMMUNITY): Payer: Self-pay

## 2023-12-06 ENCOUNTER — Encounter: Payer: Self-pay | Admitting: Family Medicine

## 2023-12-06 ENCOUNTER — Ambulatory Visit: Payer: Self-pay | Admitting: Family Medicine

## 2023-12-07 ENCOUNTER — Encounter: Payer: Self-pay | Admitting: Family Medicine

## 2023-12-08 ENCOUNTER — Other Ambulatory Visit: Payer: Self-pay

## 2023-12-11 NOTE — Progress Notes (Deleted)
 [facility]  [address] [place],  [place]  [address] [phone]  Clinic Day:  11/14/2023  Referring physician: Zachary Lamar BRAVO, NP   HISTORY OF PRESENT ILLNESS:  The patient is a 68 y.o. 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  As her hemoglobin fell below 10 again, Revlimid  5 mg every day has been restarted, in conjunction with Luspatercept .  She comes in today to check her hemoglobin to see how well she responded to this combination.  Over the past 2 weeks, the patient has complained of significant diarrhea.  She does not know why this symptom is occurring.  It has been discussed with her to undergo both a colonoscopy and upper endoscopy in the near future to rule out any GI tract abnormalities.  She does not believe her symptoms are related to either her Revlimid  or luspatercept  therapy..  This diagnosis was proven per cytogenetics done on her bone marrow biopsy in April 2019.  She briefly took Revlimid .  However, due to severe diarrhea, her Revlimid  was discontinued in July 2020.  The patient had been doing well until her hemoglobin began to fall again in 2024.  Her second bone marrow biopsy in September 2024 revealed the persistence of myelodysplasia; it also showed absent iron  stores.  This led to her receiving IV iron  recently.  Labs also showed evidence of B12 deficiency for which she has taken a protracted course of B12 injections.  Despite receiving iron  and B12, her hemoglobin remained suboptimal to where she was started on Revlimid /luspatercept  in May 2025.  PHYSICAL EXAM:  There were no vitals taken for this visit. Wt Readings from Last 3 Encounters:  11/21/23 200 lb (90.7 kg)  08/07/23 232 lb 11.2 oz (105.6 kg)  06/22/23 243 lb 8 oz (110.5 kg)   There is no height or weight on file to calculate BMI. Performance status (ECOG): 1 - Symptomatic but completely ambulatory Physical Exam Constitutional:       Appearance: Normal appearance. She is not ill-appearing.  HENT:     Mouth/Throat:     Mouth: Mucous membranes are moist.     Pharynx: Oropharynx is clear. No oropharyngeal exudate or posterior oropharyngeal erythema.  Cardiovascular:     Rate and Rhythm: Normal rate and regular rhythm.     Heart sounds: No murmur heard.    No friction rub. No gallop.  Pulmonary:     Effort: Pulmonary effort is normal. No respiratory distress.     Breath sounds: Normal breath sounds. No wheezing, rhonchi or rales.  Abdominal:     General: Bowel sounds are normal. There is no distension.     Palpations: Abdomen is soft. There is no mass.     Tenderness: There is no abdominal tenderness.  Musculoskeletal:        General: No swelling.     Right lower leg: No edema.     Left lower leg: No edema.  Lymphadenopathy:     Cervical: No cervical adenopathy.     Upper Body:     Right upper body: No supraclavicular or axillary adenopathy.     Left upper body: No supraclavicular or axillary adenopathy.     Lower Body: No right inguinal adenopathy. No left inguinal adenopathy.  Skin:    General: Skin is warm.     Coloration: Skin is not jaundiced.     Findings: No lesion or rash.  Neurological:     General: No focal deficit  present.     Mental Status: She is alert and oriented to person, place, and time. Mental status is at baseline.  Psychiatric:        Mood and Affect: Mood normal.        Behavior: Behavior normal.        Thought Content: Thought content normal.    LABS:      Latest Ref Rng & Units 11/25/2023    6:59 AM 11/25/2023    4:07 AM 11/24/2023    3:23 AM  CBC  WBC 4.0 - 10.5 K/uL 1.9  2.3  3.2   Hemoglobin 12.0 - 15.0 g/dL 8.1  7.9  9.2   Hematocrit 36.0 - 46.0 % 26.3  25.3  30.1   Platelets 150 - 400 K/uL 108  111  135       Latest Ref Rng & Units 11/25/2023    4:07 AM 11/24/2023    3:23 AM 11/23/2023    3:02 AM  CMP  Glucose 70 - 99 mg/dL 85  98  90   BUN 8 - 23 mg/dL <5  <5   <5   Creatinine 0.44 - 1.00 mg/dL 8.89  8.88  9.03   Sodium 135 - 145 mmol/L 137  134  134   Potassium 3.5 - 5.1 mmol/L 4.6  3.2  4.6   Chloride 98 - 111 mmol/L 103  99  98   CO2 22 - 32 mmol/L 25  24  28    Calcium 8.9 - 10.3 mg/dL 8.0  7.9  7.9    ASSESSMENT & PLAN:  Assessment/Plan:  A 68 y.o. 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  I am pleased as her hemoglobin is even higher at 11.2 today.  For now, the patient will continue taking Revlimid /luspatercept  for her disease management.  If her diarrhea worsens, I may ultimately hold her Revlimid  for a few weeks to see if this symptom improves.  I still believe she needs to be evaluated by GI in the forthcoming weeks to ensure her diarrhea is not due to something more ominous.  Otherwise, I will tentatively see her back in 12 weeks for repeat clinical assessment.  The patient understands all the plans discussed today and is in agreement with them.  Bethan Adamek DELENA Kerns, MD

## 2023-12-12 ENCOUNTER — Inpatient Hospital Stay: Attending: Oncology | Admitting: Dietician

## 2023-12-12 ENCOUNTER — Inpatient Hospital Stay: Attending: Oncology

## 2023-12-12 ENCOUNTER — Encounter: Payer: Self-pay | Admitting: Oncology

## 2023-12-12 ENCOUNTER — Inpatient Hospital Stay: Admitting: Oncology

## 2023-12-12 ENCOUNTER — Inpatient Hospital Stay

## 2023-12-12 DIAGNOSIS — D469 Myelodysplastic syndrome, unspecified: Secondary | ICD-10-CM

## 2023-12-12 NOTE — Progress Notes (Signed)
 Nutrition Follow Up:  Called patient at home telephone for remote follow up.  Missed appointments at cancer center as she was not feeling well.    Patient hospitalized last month with poor PO intake, C-diff infection and monitored for refeeding syndrome.  She reports formed bowel movement yesterday.  She reports trying to eating 3-4 meals a day. Has been drinking lots of soups, today attempting pizza.  She doesn't want to regain her weight so still trying smaller meals.  Drinking: water, and Boost Breeze TID  Anthropometrics:  weight loss 32# (14%) past 3 months   Height: 62 Weight:  11/21/23  200 08/07/23   232.7# UBW: 250# BMI: 42.56    NUTRITION DIAGNOSIS: Severe Malnutrition related to chronic illness as evidenced by percent weight loss, energy intake < or equal to 75% for > or equal to 1 month. Continues   INTERVENTION:   Encouraged continued use of Boost Breeze TID.  Encouraged more frequent feeds and protein pacing with lean bland proteins eating every 2-3 hours on schedule instead of waiting on hunger cues.  Encouraged attention to hydration for renal preservation.SABRA    MONITORING, EVALUATION, GOAL: weight, PO intake, Nutrition Impact Symptoms, labs  Next Visit: remote  next month to monitor progress  Micheline Craven, RDN, LDN Registered Dietitian, [facility] Part Time Remote (Usual office hours: Tuesday-Thursday) Cell: [phone]

## 2023-12-13 ENCOUNTER — Other Ambulatory Visit: Payer: Self-pay

## 2023-12-14 ENCOUNTER — Other Ambulatory Visit: Payer: Self-pay

## 2023-12-14 DIAGNOSIS — D46C Myelodysplastic syndrome with isolated del(5q) chromosomal abnormality: Secondary | ICD-10-CM

## 2023-12-14 MED ORDER — LENALIDOMIDE 5 MG PO CAPS: ORAL_CAPSULE | ORAL | 5 refills | Status: DC

## 2023-12-14 NOTE — Progress Notes (Deleted)
 [name]  [address] [place],  [place]  [address] [phone]  Clinic Day:  11/14/2023  Referring physician: Zachary Lamar BRAVO, NP   HISTORY OF PRESENT ILLNESS:  The patient is a 68 y.o. 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  As her hemoglobin fell below 10 again, Revlimid  5 mg every day has been restarted, in conjunction with Luspatercept .  She comes in today to check her hemoglobin to see how well she responded to this combination.  Over the past 2 weeks, the patient has complained of significant diarrhea.  She does not know why this symptom is occurring.  It has been discussed with her to undergo both a colonoscopy and upper endoscopy in the near future to rule out any GI tract abnormalities.  She does not believe her symptoms are related to either her Revlimid  or luspatercept  therapy..  This diagnosis was proven per cytogenetics done on her bone marrow biopsy in April 2019.  She briefly took Revlimid .  However, due to severe diarrhea, her Revlimid  was discontinued in July 2020.  The patient had been doing well until her hemoglobin began to fall again in 2024.  Her second bone marrow biopsy in September 2024 revealed the persistence of myelodysplasia; it also showed absent iron  stores.  This led to her receiving IV iron  recently.  Labs also showed evidence of B12 deficiency for which she has taken a protracted course of B12 injections.  Despite receiving iron  and B12, her hemoglobin remained suboptimal to where she was started on Revlimid /luspatercept  in May 2025.  PHYSICAL EXAM:  There were no vitals taken for this visit. Wt Readings from Last 3 Encounters:  11/21/23 200 lb (90.7 kg)  08/07/23 232 lb 11.2 oz (105.6 kg)  06/22/23 243 lb 8 oz (110.5 kg)   There is no height or weight on file to calculate BMI. Performance status (ECOG): 1 - Symptomatic but completely ambulatory Physical Exam Constitutional:       Appearance: Normal appearance. She is not ill-appearing.  HENT:     Mouth/Throat:     Mouth: Mucous membranes are moist.     Pharynx: Oropharynx is clear. No oropharyngeal exudate or posterior oropharyngeal erythema.  Cardiovascular:     Rate and Rhythm: Normal rate and regular rhythm.     Heart sounds: No murmur heard.    No friction rub. No gallop.  Pulmonary:     Effort: Pulmonary effort is normal. No respiratory distress.     Breath sounds: Normal breath sounds. No wheezing, rhonchi or rales.  Abdominal:     General: Bowel sounds are normal. There is no distension.     Palpations: Abdomen is soft. There is no mass.     Tenderness: There is no abdominal tenderness.  Musculoskeletal:        General: No swelling.     Right lower leg: No edema.     Left lower leg: No edema.  Lymphadenopathy:     Cervical: No cervical adenopathy.     Upper Body:     Right upper body: No supraclavicular or axillary adenopathy.     Left upper body: No supraclavicular or axillary adenopathy.     Lower Body: No right inguinal adenopathy. No left inguinal adenopathy.  Skin:    General: Skin is warm.     Coloration: Skin is not jaundiced.     Findings: No lesion or rash.  Neurological:     General: No focal deficit  present.     Mental Status: She is alert and oriented to person, place, and time. Mental status is at baseline.  Psychiatric:        Mood and Affect: Mood normal.        Behavior: Behavior normal.        Thought Content: Thought content normal.    LABS:      Latest Ref Rng & Units 11/25/2023    6:59 AM 11/25/2023    4:07 AM 11/24/2023    3:23 AM  CBC  WBC 4.0 - 10.5 K/uL 1.9  2.3  3.2   Hemoglobin 12.0 - 15.0 g/dL 8.1  7.9  9.2   Hematocrit 36.0 - 46.0 % 26.3  25.3  30.1   Platelets 150 - 400 K/uL 108  111  135       Latest Ref Rng & Units 11/25/2023    4:07 AM 11/24/2023    3:23 AM 11/23/2023    3:02 AM  CMP  Glucose 70 - 99 mg/dL 85  98  90   BUN 8 - 23 mg/dL <5  <5   <5   Creatinine 0.44 - 1.00 mg/dL 8.89  8.88  9.03   Sodium 135 - 145 mmol/L 137  134  134   Potassium 3.5 - 5.1 mmol/L 4.6  3.2  4.6   Chloride 98 - 111 mmol/L 103  99  98   CO2 22 - 32 mmol/L 25  24  28    Calcium 8.9 - 10.3 mg/dL 8.0  7.9  7.9    ASSESSMENT & PLAN:  Assessment/Plan:  A 68 y.o. 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  I am pleased as her hemoglobin is even higher at 11.2 today.  For now, the patient will continue taking Revlimid /luspatercept  for her disease management.  If her diarrhea worsens, I may ultimately hold her Revlimid  for a few weeks to see if this symptom improves.  I still believe she needs to be evaluated by GI in the forthcoming weeks to ensure her diarrhea is not due to something more ominous.  Otherwise, I will tentatively see her back in 12 weeks for repeat clinical assessment.  The patient understands all the plans discussed today and is in agreement with them.  Edrie Ehrich DELENA Kerns, MD

## 2023-12-15 ENCOUNTER — Inpatient Hospital Stay

## 2023-12-15 ENCOUNTER — Inpatient Hospital Stay: Admitting: Oncology

## 2023-12-20 ENCOUNTER — Telehealth: Payer: Self-pay

## 2023-12-20 ENCOUNTER — Telehealth: Payer: Self-pay | Admitting: Oncology

## 2023-12-20 ENCOUNTER — Inpatient Hospital Stay: Admitting: Oncology

## 2023-12-20 ENCOUNTER — Inpatient Hospital Stay

## 2023-12-20 NOTE — Telephone Encounter (Signed)
 Pt called to report that she feels like something is crawling in my legs & arms . She didn't come to appt today because she still feels weak from the C diff. She the sensation she is having is intermittent and has been occurring for couple weeks. She is drinking 3-4 boost per day. I told her she really needs to have some labs drawn. Please advise.

## 2023-12-20 NOTE — Telephone Encounter (Signed)
 12/20/23 Patient called and cancelled appts.She will call back to reschedule when she is feeling better.

## 2023-12-21 ENCOUNTER — Encounter: Payer: Self-pay | Admitting: Oncology

## 2023-12-21 NOTE — Progress Notes (Deleted)
 [name]  [address] [place],  [place]  [address] [phone]  Clinic Day:  11/14/2023  Referring physician: Zachary Lamar BRAVO, NP   HISTORY OF PRESENT ILLNESS:  The patient is a 68 y.o. 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  As her hemoglobin fell below 10 again, Revlimid  5 mg every day has been restarted, in conjunction with Luspatercept .  She comes in today to check her hemoglobin to see how well she responded to this combination.  Over the past 2 weeks, the patient has complained of significant diarrhea.  She does not know why this symptom is occurring.  It has been discussed with her to undergo both a colonoscopy and upper endoscopy in the near future to rule out any GI tract abnormalities.  She does not believe her symptoms are related to either her Revlimid  or luspatercept  therapy..  This diagnosis was proven per cytogenetics done on her bone marrow biopsy in April 2019.  She briefly took Revlimid .  However, due to severe diarrhea, her Revlimid  was discontinued in July 2020.  The patient had been doing well until her hemoglobin began to fall again in 2024.  Her second bone marrow biopsy in September 2024 revealed the persistence of myelodysplasia; it also showed absent iron  stores.  This led to her receiving IV iron  recently.  Labs also showed evidence of B12 deficiency for which she has taken a protracted course of B12 injections.  Despite receiving iron  and B12, her hemoglobin remained suboptimal to where she was started on Revlimid /luspatercept  in May 2025.  PHYSICAL EXAM:  There were no vitals taken for this visit. Wt Readings from Last 3 Encounters:  11/21/23 200 lb (90.7 kg)  08/07/23 232 lb 11.2 oz (105.6 kg)  06/22/23 243 lb 8 oz (110.5 kg)   There is no height or weight on file to calculate BMI. Performance status (ECOG): 1 - Symptomatic but completely ambulatory Physical Exam Constitutional:       Appearance: Normal appearance. She is not ill-appearing.  HENT:     Mouth/Throat:     Mouth: Mucous membranes are moist.     Pharynx: Oropharynx is clear. No oropharyngeal exudate or posterior oropharyngeal erythema.  Cardiovascular:     Rate and Rhythm: Normal rate and regular rhythm.     Heart sounds: No murmur heard.    No friction rub. No gallop.  Pulmonary:     Effort: Pulmonary effort is normal. No respiratory distress.     Breath sounds: Normal breath sounds. No wheezing, rhonchi or rales.  Abdominal:     General: Bowel sounds are normal. There is no distension.     Palpations: Abdomen is soft. There is no mass.     Tenderness: There is no abdominal tenderness.  Musculoskeletal:        General: No swelling.     Right lower leg: No edema.     Left lower leg: No edema.  Lymphadenopathy:     Cervical: No cervical adenopathy.     Upper Body:     Right upper body: No supraclavicular or axillary adenopathy.     Left upper body: No supraclavicular or axillary adenopathy.     Lower Body: No right inguinal adenopathy. No left inguinal adenopathy.  Skin:    General: Skin is warm.     Coloration: Skin is not jaundiced.     Findings: No lesion or rash.  Neurological:     General: No focal deficit  present.     Mental Status: She is alert and oriented to person, place, and time. Mental status is at baseline.  Psychiatric:        Mood and Affect: Mood normal.        Behavior: Behavior normal.        Thought Content: Thought content normal.    LABS:      Latest Ref Rng & Units 11/25/2023    6:59 AM 11/25/2023    4:07 AM 11/24/2023    3:23 AM  CBC  WBC 4.0 - 10.5 K/uL 1.9  2.3  3.2   Hemoglobin 12.0 - 15.0 g/dL 8.1  7.9  9.2   Hematocrit 36.0 - 46.0 % 26.3  25.3  30.1   Platelets 150 - 400 K/uL 108  111  135       Latest Ref Rng & Units 11/25/2023    4:07 AM 11/24/2023    3:23 AM 11/23/2023    3:02 AM  CMP  Glucose 70 - 99 mg/dL 85  98  90   BUN 8 - 23 mg/dL <5  <5   <5   Creatinine 0.44 - 1.00 mg/dL 8.89  8.88  9.03   Sodium 135 - 145 mmol/L 137  134  134   Potassium 3.5 - 5.1 mmol/L 4.6  3.2  4.6   Chloride 98 - 111 mmol/L 103  99  98   CO2 22 - 32 mmol/L 25  24  28    Calcium 8.9 - 10.3 mg/dL 8.0  7.9  7.9    ASSESSMENT & PLAN:  Assessment/Plan:  A 68 y.o. 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  I am pleased as her hemoglobin is even higher at 11.2 today.  For now, the patient will continue taking Revlimid /luspatercept  for her disease management.  If her diarrhea worsens, I may ultimately hold her Revlimid  for a few weeks to see if this symptom improves.  I still believe she needs to be evaluated by GI in the forthcoming weeks to ensure her diarrhea is not due to something more ominous.  Otherwise, I will tentatively see her back in 12 weeks for repeat clinical assessment.  The patient understands all the plans discussed today and is in agreement with them.  Edrie Ehrich DELENA Kerns, MD

## 2023-12-22 ENCOUNTER — Inpatient Hospital Stay

## 2023-12-22 ENCOUNTER — Telehealth: Payer: Self-pay | Admitting: Oncology

## 2023-12-22 ENCOUNTER — Inpatient Hospital Stay: Admitting: Oncology

## 2023-12-22 NOTE — Telephone Encounter (Signed)
 12/22/23 Patient called to rescheduling appt.Having back issues.

## 2023-12-25 NOTE — Progress Notes (Deleted)
 [name]  [address] [place],  [place]  [address] [phone]  Clinic Day:  11/14/2023  Referring physician: Zachary Lamar BRAVO, NP   HISTORY OF PRESENT ILLNESS:  The patient is a 68 y.o. 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  As her hemoglobin fell below 10 again, Revlimid  5 mg every day has been restarted, in conjunction with Luspatercept .  She comes in today to check her hemoglobin to see how well she responded to this combination.  Over the past 2 weeks, the patient has complained of significant diarrhea.  She does not know why this symptom is occurring.  It has been discussed with her to undergo both a colonoscopy and upper endoscopy in the near future to rule out any GI tract abnormalities.  She does not believe her symptoms are related to either her Revlimid  or luspatercept  therapy..  This diagnosis was proven per cytogenetics done on her bone marrow biopsy in April 2019.  She briefly took Revlimid .  However, due to severe diarrhea, her Revlimid  was discontinued in July 2020.  The patient had been doing well until her hemoglobin began to fall again in 2024.  Her second bone marrow biopsy in September 2024 revealed the persistence of myelodysplasia; it also showed absent iron  stores.  This led to her receiving IV iron  recently.  Labs also showed evidence of B12 deficiency for which she has taken a protracted course of B12 injections.  Despite receiving iron  and B12, her hemoglobin remained suboptimal to where she was started on Revlimid /luspatercept  in May 2025.  PHYSICAL EXAM:  There were no vitals taken for this visit. Wt Readings from Last 3 Encounters:  11/21/23 200 lb (90.7 kg)  08/07/23 232 lb 11.2 oz (105.6 kg)  06/22/23 243 lb 8 oz (110.5 kg)   There is no height or weight on file to calculate BMI. Performance status (ECOG): 1 - Symptomatic but completely ambulatory Physical Exam Constitutional:       Appearance: Normal appearance. She is not ill-appearing.  HENT:     Mouth/Throat:     Mouth: Mucous membranes are moist.     Pharynx: Oropharynx is clear. No oropharyngeal exudate or posterior oropharyngeal erythema.  Cardiovascular:     Rate and Rhythm: Normal rate and regular rhythm.     Heart sounds: No murmur heard.    No friction rub. No gallop.  Pulmonary:     Effort: Pulmonary effort is normal. No respiratory distress.     Breath sounds: Normal breath sounds. No wheezing, rhonchi or rales.  Abdominal:     General: Bowel sounds are normal. There is no distension.     Palpations: Abdomen is soft. There is no mass.     Tenderness: There is no abdominal tenderness.  Musculoskeletal:        General: No swelling.     Right lower leg: No edema.     Left lower leg: No edema.  Lymphadenopathy:     Cervical: No cervical adenopathy.     Upper Body:     Right upper body: No supraclavicular or axillary adenopathy.     Left upper body: No supraclavicular or axillary adenopathy.     Lower Body: No right inguinal adenopathy. No left inguinal adenopathy.  Skin:    General: Skin is warm.     Coloration: Skin is not jaundiced.     Findings: No lesion or rash.  Neurological:     General: No focal deficit  present.     Mental Status: She is alert and oriented to person, place, and time. Mental status is at baseline.  Psychiatric:        Mood and Affect: Mood normal.        Behavior: Behavior normal.        Thought Content: Thought content normal.    LABS:      Latest Ref Rng & Units 11/25/2023    6:59 AM 11/25/2023    4:07 AM 11/24/2023    3:23 AM  CBC  WBC 4.0 - 10.5 K/uL 1.9  2.3  3.2   Hemoglobin 12.0 - 15.0 g/dL 8.1  7.9  9.2   Hematocrit 36.0 - 46.0 % 26.3  25.3  30.1   Platelets 150 - 400 K/uL 108  111  135       Latest Ref Rng & Units 11/25/2023    4:07 AM 11/24/2023    3:23 AM 11/23/2023    3:02 AM  CMP  Glucose 70 - 99 mg/dL 85  98  90   BUN 8 - 23 mg/dL <5  <5   <5   Creatinine 0.44 - 1.00 mg/dL 8.89  8.88  9.03   Sodium 135 - 145 mmol/L 137  134  134   Potassium 3.5 - 5.1 mmol/L 4.6  3.2  4.6   Chloride 98 - 111 mmol/L 103  99  98   CO2 22 - 32 mmol/L 25  24  28    Calcium 8.9 - 10.3 mg/dL 8.0  7.9  7.9    ASSESSMENT & PLAN:  Assessment/Plan:  A 68 y.o. 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  I am pleased as her hemoglobin is even higher at 11.2 today.  For now, the patient will continue taking Revlimid /luspatercept  for her disease management.  If her diarrhea worsens, I may ultimately hold her Revlimid  for a few weeks to see if this symptom improves.  I still believe she needs to be evaluated by GI in the forthcoming weeks to ensure her diarrhea is not due to something more ominous.  Otherwise, I will tentatively see her back in 12 weeks for repeat clinical assessment.  The patient understands all the plans discussed today and is in agreement with them.  [name], MD

## 2023-12-26 ENCOUNTER — Inpatient Hospital Stay

## 2023-12-26 ENCOUNTER — Inpatient Hospital Stay: Admitting: Oncology

## 2024-01-09 ENCOUNTER — Other Ambulatory Visit: Payer: Self-pay

## 2024-01-10 ENCOUNTER — Inpatient Hospital Stay: Attending: Oncology | Admitting: Dietician

## 2024-01-10 NOTE — Progress Notes (Signed)
 Nutrition Follow Up:  Called patient at home telephone for remote follow up.    Patient reports going to see GI MD soon.  She is eating a little better.  Forcing herself to eat, still no appetite.  Still eating little meals.  Cooked holiday meals, baked chicken, green beans, baked potato Drinking: water, and Boost Breeze TID, Sprite, lemonade  Anthropometrics:  Home weight 205#, prior weight loss 32# (14%) past 3 months   Height: 62 Weight:  11/21/23  200 08/07/23   232.7# UBW: 250# BMI: 42.56    NUTRITION DIAGNOSIS: Severe Malnutrition related to chronic illness as evidenced by percent weight loss, energy intake < or equal to 75% for > or equal to 1 month. Resolving with improved PO and weight gain per patient at home.   INTERVENTION:   Encouraged continued use of Ensure clear/Boost Breeze as needed for weight maintenance. Encourage her to asked for coupons at cancer center.  Encouraged attention to protein pacing with lean bland proteins eating every 2-3.  Encouraged attention to hydration for renal preservation. Reviewed some low sugar alternatives to water (cold herbal teas, sugar free jello, flavored waters/seltzers).    MONITORING, EVALUATION, GOAL: weight, PO intake, Nutrition Impact Symptoms, labs  Next Visit: PRN at patient or provider request  Micheline Craven, RDN, LDN Registered Dietitian, [facility] Part Time Remote (Usual office hours: Tuesday-Thursday) Cell: [phone]

## 2024-01-11 ENCOUNTER — Other Ambulatory Visit: Payer: Self-pay | Admitting: Hematology and Oncology

## 2024-01-11 DIAGNOSIS — D46C Myelodysplastic syndrome with isolated del(5q) chromosomal abnormality: Secondary | ICD-10-CM

## 2024-01-12 ENCOUNTER — Telehealth: Payer: Self-pay | Admitting: Oncology

## 2024-01-12 ENCOUNTER — Inpatient Hospital Stay

## 2024-01-12 ENCOUNTER — Inpatient Hospital Stay: Admitting: Oncology

## 2024-01-12 NOTE — Telephone Encounter (Signed)
 01/12/23 Patient called and cancelled appt.I informed her to call back on Monday to reschedule.

## 2024-01-16 ENCOUNTER — Other Ambulatory Visit: Payer: Self-pay | Admitting: Hematology and Oncology

## 2024-01-16 DIAGNOSIS — D46C Myelodysplastic syndrome with isolated del(5q) chromosomal abnormality: Secondary | ICD-10-CM

## 2024-01-18 MED ORDER — LENALIDOMIDE 5 MG PO CAPS: ORAL_CAPSULE | ORAL | 5 refills | Status: AC

## 2024-01-23 ENCOUNTER — Encounter: Payer: Self-pay | Admitting: Oncology
